# Patient Record
Sex: Female | Born: 1935
Health system: Southern US, Community
[De-identification: ages and names within clinical notes are randomized; demographics above are authoritative.]

## PROBLEM LIST (undated history)

## (undated) DIAGNOSIS — M1990 Unspecified osteoarthritis, unspecified site: Secondary | ICD-10-CM

## (undated) DIAGNOSIS — C50912 Malignant neoplasm of unspecified site of left female breast: Secondary | ICD-10-CM

## (undated) DIAGNOSIS — K642 Third degree hemorrhoids: Principal | ICD-10-CM

## (undated) DIAGNOSIS — K219 Gastro-esophageal reflux disease without esophagitis: Secondary | ICD-10-CM

## (undated) DIAGNOSIS — J449 Chronic obstructive pulmonary disease, unspecified: Secondary | ICD-10-CM

## (undated) DIAGNOSIS — I10 Essential (primary) hypertension: Secondary | ICD-10-CM

## (undated) DIAGNOSIS — Z9981 Dependence on supplemental oxygen: Secondary | ICD-10-CM

## (undated) DIAGNOSIS — F32A Depression, unspecified: Secondary | ICD-10-CM

## (undated) DIAGNOSIS — F419 Anxiety disorder, unspecified: Secondary | ICD-10-CM

## (undated) DIAGNOSIS — E7800 Pure hypercholesterolemia, unspecified: Secondary | ICD-10-CM

## (undated) DIAGNOSIS — F329 Major depressive disorder, single episode, unspecified: Secondary | ICD-10-CM

## (undated) DIAGNOSIS — C4490 Unspecified malignant neoplasm of skin, unspecified: Secondary | ICD-10-CM

## (undated) DIAGNOSIS — J441 Chronic obstructive pulmonary disease with (acute) exacerbation: Secondary | ICD-10-CM

## (undated) DIAGNOSIS — E119 Type 2 diabetes mellitus without complications: Secondary | ICD-10-CM

## (undated) DIAGNOSIS — J4489 Other specified chronic obstructive pulmonary disease: Secondary | ICD-10-CM

## (undated) HISTORY — PX: ABDOMINAL HYSTERECTOMY: SHX81

## (undated) HISTORY — PX: CATARACT EXTRACTION, BILATERAL: SHX1313

## (undated) HISTORY — PX: DILATION AND CURETTAGE OF UTERUS: SHX78

## (undated) HISTORY — PX: BREAST BIOPSY: SHX20

## (undated) HISTORY — DX: Essential (primary) hypertension: I10

## (undated) HISTORY — DX: Unspecified osteoarthritis, unspecified site: M19.90

## (undated) HISTORY — DX: Gastro-esophageal reflux disease without esophagitis: K21.9

## (undated) HISTORY — DX: Third degree hemorrhoids: K64.2

## (undated) HISTORY — PX: SKIN CANCER EXCISION: SHX779

---

## 1998-05-31 ENCOUNTER — Inpatient Hospital Stay (HOSPITAL_COMMUNITY): Admission: EM | Admit: 1998-05-31 | Discharge: 1998-06-01 | Payer: Self-pay | Admitting: Emergency Medicine

## 2001-05-16 ENCOUNTER — Encounter: Payer: Self-pay | Admitting: Family Medicine

## 2001-05-16 ENCOUNTER — Encounter: Admission: RE | Admit: 2001-05-16 | Discharge: 2001-05-16 | Payer: Self-pay | Admitting: Family Medicine

## 2003-03-19 ENCOUNTER — Encounter: Payer: Self-pay | Admitting: Family Medicine

## 2003-03-19 ENCOUNTER — Encounter: Admission: RE | Admit: 2003-03-19 | Discharge: 2003-03-19 | Payer: Self-pay | Admitting: Family Medicine

## 2006-04-18 ENCOUNTER — Encounter (INDEPENDENT_AMBULATORY_CARE_PROVIDER_SITE_OTHER): Payer: Self-pay | Admitting: *Deleted

## 2006-04-18 ENCOUNTER — Encounter (INDEPENDENT_AMBULATORY_CARE_PROVIDER_SITE_OTHER): Payer: Self-pay | Admitting: Diagnostic Radiology

## 2006-04-18 ENCOUNTER — Encounter: Admission: RE | Admit: 2006-04-18 | Discharge: 2006-04-18 | Payer: Self-pay | Admitting: Family Medicine

## 2006-05-01 ENCOUNTER — Encounter: Admission: RE | Admit: 2006-05-01 | Discharge: 2006-05-01 | Payer: Self-pay | Admitting: Surgery

## 2006-05-11 ENCOUNTER — Ambulatory Visit (HOSPITAL_COMMUNITY): Admission: RE | Admit: 2006-05-11 | Discharge: 2006-05-12 | Payer: Self-pay | Admitting: Surgery

## 2006-05-11 ENCOUNTER — Encounter (INDEPENDENT_AMBULATORY_CARE_PROVIDER_SITE_OTHER): Payer: Self-pay | Admitting: *Deleted

## 2006-05-11 HISTORY — PX: MASTECTOMY: SHX3

## 2006-05-16 ENCOUNTER — Ambulatory Visit: Payer: Self-pay | Admitting: Oncology

## 2006-05-24 LAB — CBC WITH DIFFERENTIAL/PLATELET
BASO%: 0.6 % (ref 0.0–2.0)
Basophils Absolute: 0.1 10e3/uL (ref 0.0–0.1)
EOS%: 1.7 % (ref 0.0–7.0)
Eosinophils Absolute: 0.2 10e3/uL (ref 0.0–0.5)
HCT: 39.4 % (ref 34.8–46.6)
HGB: 13.1 g/dL (ref 11.6–15.9)
LYMPH%: 33.3 % (ref 14.0–48.0)
MCH: 28.4 pg (ref 26.0–34.0)
MCHC: 33.3 g/dL (ref 32.0–36.0)
MCV: 85.2 fL (ref 81.0–101.0)
MONO#: 0.6 10e3/uL (ref 0.1–0.9)
MONO%: 5.9 % (ref 0.0–13.0)
NEUT#: 5.7 10e3/uL (ref 1.5–6.5)
NEUT%: 58.5 % (ref 39.6–76.8)
Platelets: 280 10e3/uL (ref 145–400)
RBC: 4.62 10e6/uL (ref 3.70–5.32)
RDW: 13.8 % (ref 11.3–14.5)
WBC: 9.7 10e3/uL (ref 3.9–10.0)
lymph#: 3.2 10e3/uL (ref 0.9–3.3)

## 2006-05-24 LAB — LACTATE DEHYDROGENASE: LDH: 169 U/L (ref 94–250)

## 2006-05-24 LAB — CANCER ANTIGEN 27.29: CA 27.29: 11 U/mL (ref 0–39)

## 2006-05-24 LAB — COMPREHENSIVE METABOLIC PANEL
ALT: 53 U/L — ABNORMAL HIGH (ref 0–40)
CO2: 31 mEq/L (ref 19–32)
Calcium: 9.7 mg/dL (ref 8.4–10.5)
Chloride: 99 mEq/L (ref 96–112)
Creatinine, Ser: 0.8 mg/dL (ref 0.4–1.2)
Glucose, Bld: 187 mg/dL — ABNORMAL HIGH (ref 70–99)
Sodium: 138 mEq/L (ref 135–145)
Total Protein: 7.8 g/dL (ref 6.0–8.3)

## 2006-06-07 ENCOUNTER — Ambulatory Visit (HOSPITAL_COMMUNITY): Admission: RE | Admit: 2006-06-07 | Discharge: 2006-06-07 | Payer: Self-pay | Admitting: Oncology

## 2006-07-10 ENCOUNTER — Ambulatory Visit: Payer: Self-pay | Admitting: Oncology

## 2006-07-13 LAB — COMPREHENSIVE METABOLIC PANEL
ALT: 49 U/L — ABNORMAL HIGH (ref 0–40)
Albumin: 4.5 g/dL (ref 3.5–5.2)
CO2: 31 mEq/L (ref 19–32)
Calcium: 9.8 mg/dL (ref 8.4–10.5)
Chloride: 98 mEq/L (ref 96–112)
Glucose, Bld: 111 mg/dL — ABNORMAL HIGH (ref 70–99)
Sodium: 140 mEq/L (ref 135–145)
Total Protein: 8 g/dL (ref 6.0–8.3)

## 2006-07-13 LAB — CBC WITH DIFFERENTIAL/PLATELET
BASO%: 0.5 % (ref 0.0–2.0)
Eosinophils Absolute: 0.2 10*3/uL (ref 0.0–0.5)
HCT: 44.1 % (ref 34.8–46.6)
MCHC: 33.8 g/dL (ref 32.0–36.0)
MONO#: 0.6 10*3/uL (ref 0.1–0.9)
NEUT#: 4.5 10*3/uL (ref 1.5–6.5)
Platelets: 226 10*3/uL (ref 145–400)
RBC: 5.15 10*6/uL (ref 3.70–5.32)
WBC: 9.5 10*3/uL (ref 3.9–10.0)
lymph#: 4.2 10*3/uL — ABNORMAL HIGH (ref 0.9–3.3)

## 2006-10-03 ENCOUNTER — Ambulatory Visit: Payer: Self-pay | Admitting: Oncology

## 2006-10-05 LAB — CBC WITH DIFFERENTIAL/PLATELET
Eosinophils Absolute: 0.2 10*3/uL (ref 0.0–0.5)
HGB: 15.2 g/dL (ref 11.6–15.9)
LYMPH%: 38.9 % (ref 14.0–48.0)
MCH: 29.1 pg (ref 26.0–34.0)
MCHC: 34.5 g/dL (ref 32.0–36.0)
MCV: 84.3 fL (ref 81.0–101.0)
MONO#: 0.6 10*3/uL (ref 0.1–0.9)
NEUT%: 51.7 % (ref 39.6–76.8)

## 2006-10-05 LAB — COMPREHENSIVE METABOLIC PANEL
AST: 38 U/L — ABNORMAL HIGH (ref 0–37)
Albumin: 4.4 g/dL (ref 3.5–5.2)
Alkaline Phosphatase: 51 U/L (ref 39–117)
BUN: 12 mg/dL (ref 6–23)
Calcium: 10.1 mg/dL (ref 8.4–10.5)
Creatinine, Ser: 0.82 mg/dL (ref 0.40–1.20)
Glucose, Bld: 124 mg/dL — ABNORMAL HIGH (ref 70–99)
Potassium: 5.1 mEq/L (ref 3.5–5.3)

## 2006-10-05 LAB — LACTATE DEHYDROGENASE: LDH: 144 U/L (ref 94–250)

## 2006-10-18 ENCOUNTER — Ambulatory Visit (HOSPITAL_COMMUNITY): Admission: RE | Admit: 2006-10-18 | Discharge: 2006-10-18 | Payer: Self-pay | Admitting: Oncology

## 2007-02-05 ENCOUNTER — Ambulatory Visit: Payer: Self-pay | Admitting: Oncology

## 2007-02-08 LAB — COMPREHENSIVE METABOLIC PANEL
ALT: 22 U/L (ref 0–35)
AST: 24 U/L (ref 0–37)
Albumin: 4.2 g/dL (ref 3.5–5.2)
Alkaline Phosphatase: 48 U/L (ref 39–117)
BUN: 15 mg/dL (ref 6–23)
Potassium: 5.2 mEq/L (ref 3.5–5.3)
Sodium: 139 mEq/L (ref 135–145)
Total Protein: 7.9 g/dL (ref 6.0–8.3)

## 2007-02-08 LAB — CBC WITH DIFFERENTIAL/PLATELET
BASO%: 0.5 % (ref 0.0–2.0)
EOS%: 0.7 % (ref 0.0–7.0)
MCH: 29 pg (ref 26.0–34.0)
MCV: 83.5 fL (ref 81.0–101.0)
MONO%: 6.9 % (ref 0.0–13.0)
RBC: 5.07 10*6/uL (ref 3.70–5.32)
RDW: 14.2 % (ref 11.3–14.5)
lymph#: 3.3 10*3/uL (ref 0.9–3.3)

## 2007-04-30 ENCOUNTER — Ambulatory Visit: Payer: Self-pay | Admitting: Oncology

## 2007-05-02 LAB — CBC WITH DIFFERENTIAL/PLATELET
BASO%: 0.6 % (ref 0.0–2.0)
Basophils Absolute: 0.1 10*3/uL (ref 0.0–0.1)
EOS%: 1.2 % (ref 0.0–7.0)
HCT: 42.1 % (ref 34.8–46.6)
HGB: 14.4 g/dL (ref 11.6–15.9)
LYMPH%: 39.7 % (ref 14.0–48.0)
MCH: 28.8 pg (ref 26.0–34.0)
MCHC: 34.4 g/dL (ref 32.0–36.0)
NEUT%: 52.7 % (ref 39.6–76.8)
Platelets: 298 10*3/uL (ref 145–400)

## 2007-05-02 LAB — COMPREHENSIVE METABOLIC PANEL
ALT: 29 U/L (ref 0–35)
BUN: 14 mg/dL (ref 6–23)
CO2: 27 mEq/L (ref 19–32)
Calcium: 10.4 mg/dL (ref 8.4–10.5)
Creatinine, Ser: 0.84 mg/dL (ref 0.40–1.20)
Total Bilirubin: 0.3 mg/dL (ref 0.3–1.2)

## 2007-05-02 LAB — LACTATE DEHYDROGENASE: LDH: 128 U/L (ref 94–250)

## 2007-10-30 ENCOUNTER — Ambulatory Visit: Payer: Self-pay | Admitting: Oncology

## 2007-11-01 LAB — CBC WITH DIFFERENTIAL/PLATELET
Basophils Absolute: 0 10*3/uL (ref 0.0–0.1)
EOS%: 2.5 % (ref 0.0–7.0)
Eosinophils Absolute: 0.2 10*3/uL (ref 0.0–0.5)
HCT: 41.1 % (ref 34.8–46.6)
HGB: 14.3 g/dL (ref 11.6–15.9)
MCH: 29.9 pg (ref 26.0–34.0)
MONO#: 0.6 10*3/uL (ref 0.1–0.9)
NEUT#: 4.7 10*3/uL (ref 1.5–6.5)
NEUT%: 49.9 % (ref 39.6–76.8)
lymph#: 3.8 10*3/uL — ABNORMAL HIGH (ref 0.9–3.3)

## 2007-11-01 LAB — COMPREHENSIVE METABOLIC PANEL
Albumin: 4.3 g/dL (ref 3.5–5.2)
BUN: 15 mg/dL (ref 6–23)
CO2: 28 mEq/L (ref 19–32)
Calcium: 10 mg/dL (ref 8.4–10.5)
Chloride: 105 mEq/L (ref 96–112)
Creatinine, Ser: 1.25 mg/dL — ABNORMAL HIGH (ref 0.40–1.20)
Glucose, Bld: 131 mg/dL — ABNORMAL HIGH (ref 70–99)
Potassium: 4.7 mEq/L (ref 3.5–5.3)

## 2007-11-01 LAB — LACTATE DEHYDROGENASE: LDH: 130 U/L (ref 94–250)

## 2007-11-13 ENCOUNTER — Ambulatory Visit (HOSPITAL_COMMUNITY): Admission: RE | Admit: 2007-11-13 | Discharge: 2007-11-13 | Payer: Self-pay | Admitting: Oncology

## 2007-12-17 ENCOUNTER — Ambulatory Visit: Payer: Self-pay | Admitting: Oncology

## 2007-12-25 LAB — VITAMIN D PNL(25-HYDRXY+1,25-DIHY)-BLD
Vit D, 1,25-Dihydroxy: 43 pg/mL (ref 6–62)
Vit D, 25-Hydroxy: 47 ng/mL (ref 30–89)

## 2008-06-11 ENCOUNTER — Ambulatory Visit: Payer: Self-pay | Admitting: Oncology

## 2008-06-13 LAB — CBC WITH DIFFERENTIAL/PLATELET
Basophils Absolute: 0 10*3/uL (ref 0.0–0.1)
Eosinophils Absolute: 0.1 10*3/uL (ref 0.0–0.5)
HGB: 13.6 g/dL (ref 11.6–15.9)
MCV: 84.5 fL (ref 81.0–101.0)
MONO#: 0.4 10*3/uL (ref 0.1–0.9)
MONO%: 5.8 % (ref 0.0–13.0)
NEUT#: 3.8 10*3/uL (ref 1.5–6.5)
Platelets: 260 10*3/uL (ref 145–400)
RDW: 14 % (ref 11.3–14.5)

## 2008-06-16 LAB — COMPREHENSIVE METABOLIC PANEL
Albumin: 4.7 g/dL (ref 3.5–5.2)
Alkaline Phosphatase: 24 U/L — ABNORMAL LOW (ref 39–117)
BUN: 17 mg/dL (ref 6–23)
CO2: 28 mEq/L (ref 19–32)
Glucose, Bld: 102 mg/dL — ABNORMAL HIGH (ref 70–99)
Potassium: 4.3 mEq/L (ref 3.5–5.3)
Sodium: 139 mEq/L (ref 135–145)
Total Protein: 7.5 g/dL (ref 6.0–8.3)

## 2008-06-16 LAB — CANCER ANTIGEN 27.29: CA 27.29: 19 U/mL (ref 0–39)

## 2008-06-16 LAB — LACTATE DEHYDROGENASE: LDH: 121 U/L (ref 94–250)

## 2008-06-16 LAB — VITAMIN D 25 HYDROXY (VIT D DEFICIENCY, FRACTURES): Vit D, 25-Hydroxy: 25 ng/mL — ABNORMAL LOW (ref 30–89)

## 2008-06-25 ENCOUNTER — Ambulatory Visit: Payer: Self-pay | Admitting: Oncology

## 2009-02-02 ENCOUNTER — Ambulatory Visit: Payer: Self-pay | Admitting: Oncology

## 2009-02-17 ENCOUNTER — Ambulatory Visit (HOSPITAL_COMMUNITY): Admission: RE | Admit: 2009-02-17 | Discharge: 2009-02-17 | Payer: Self-pay | Admitting: Oncology

## 2009-02-24 ENCOUNTER — Ambulatory Visit (HOSPITAL_COMMUNITY): Admission: RE | Admit: 2009-02-24 | Discharge: 2009-02-24 | Payer: Self-pay | Admitting: Oncology

## 2009-05-29 ENCOUNTER — Encounter: Admission: RE | Admit: 2009-05-29 | Discharge: 2009-05-29 | Payer: Self-pay | Admitting: Oncology

## 2009-08-05 ENCOUNTER — Ambulatory Visit: Payer: Self-pay | Admitting: Oncology

## 2009-08-07 LAB — CBC WITH DIFFERENTIAL/PLATELET
BASO%: 0.6 % (ref 0.0–2.0)
EOS%: 1.2 % (ref 0.0–7.0)
LYMPH%: 39.8 % (ref 14.0–49.7)
MCHC: 33.7 g/dL (ref 31.5–36.0)
MCV: 85.6 fL (ref 79.5–101.0)
MONO%: 7 % (ref 0.0–14.0)
Platelets: 267 10*3/uL (ref 145–400)
RBC: 4.71 10*6/uL (ref 3.70–5.45)
WBC: 7.6 10*3/uL (ref 3.9–10.3)

## 2009-08-10 LAB — COMPREHENSIVE METABOLIC PANEL
ALT: 22 U/L (ref 0–35)
Alkaline Phosphatase: 22 U/L — ABNORMAL LOW (ref 39–117)
Sodium: 136 mEq/L (ref 135–145)
Total Bilirubin: 0.3 mg/dL (ref 0.3–1.2)
Total Protein: 7.5 g/dL (ref 6.0–8.3)

## 2010-05-31 ENCOUNTER — Encounter: Admission: RE | Admit: 2010-05-31 | Discharge: 2010-05-31 | Payer: Self-pay | Admitting: Oncology

## 2010-08-04 ENCOUNTER — Ambulatory Visit: Payer: Self-pay | Admitting: Oncology

## 2010-08-06 LAB — COMPREHENSIVE METABOLIC PANEL
ALT: 19 U/L (ref 0–35)
AST: 21 U/L (ref 0–37)
Albumin: 4.6 g/dL (ref 3.5–5.2)
Alkaline Phosphatase: 18 U/L — ABNORMAL LOW (ref 39–117)
CO2: 24 mEq/L (ref 19–32)
Calcium: 10.4 mg/dL (ref 8.4–10.5)
Chloride: 94 mEq/L — ABNORMAL LOW (ref 96–112)
Total Protein: 7.1 g/dL (ref 6.0–8.3)

## 2010-08-06 LAB — LACTATE DEHYDROGENASE: LDH: 102 U/L (ref 94–250)

## 2010-08-06 LAB — CBC WITH DIFFERENTIAL/PLATELET
Basophils Absolute: 0.1 10*3/uL (ref 0.0–0.1)
EOS%: 0.9 % (ref 0.0–7.0)
HCT: 42 % (ref 34.8–46.6)
LYMPH%: 35.9 % (ref 14.0–49.7)
MCV: 84.6 fL (ref 79.5–101.0)
MONO%: 6.2 % (ref 0.0–14.0)
NEUT%: 56.4 % (ref 38.4–76.8)
Platelets: 271 10*3/uL (ref 145–400)

## 2010-08-06 LAB — VITAMIN D 25 HYDROXY (VIT D DEFICIENCY, FRACTURES): Vit D, 25-Hydroxy: 47 ng/mL (ref 30–89)

## 2011-01-15 ENCOUNTER — Other Ambulatory Visit: Payer: Self-pay | Admitting: Oncology

## 2011-01-15 DIAGNOSIS — Z1239 Encounter for other screening for malignant neoplasm of breast: Secondary | ICD-10-CM

## 2011-05-13 NOTE — Op Note (Signed)
[name], Jacqueline Cervantes               ACCOUNT NO.:  [id number]   MEDICAL RECORD NO.:  [id number]          PATIENT TYPE:  OIB   LOCATION:  5705                         FACILITY:  [facility]   PHYSICIAN:  Currie Paris, M.D.DATE OF BIRTH:  24-Oct-1935   DATE OF PROCEDURE:  05/11/2006  DATE OF DISCHARGE:  05/12/2006                                 OPERATIVE REPORT   [facility] #[id number]   PREOPERATIVE DIAGNOSIS:  Cancer left breast, multifocal.   POSTOPERATIVE DIAGNOSIS:  Cancer left breast, multifocal.   OPERATION:  Left total mastectomy with blue dye injection and axillary  sentinel lymph node biopsy (three nodes).   SURGEON:  Dr. Jamey Ripa.   ANESTHESIA:  General.   CLINICAL HISTORY:  This patient is a 70-year lady who recently was found to  have multifocal left breast cancer.  After discussion with the patient, we  elected to proceed to mastectomy with sentinel node evaluation.   DESCRIPTION OF PROCEDURE:  The patient was seen in the holding area.  She  had no further questions.  Her left breast already had been injected with  her radioisotope.  She and I marked the left breast as the operative side.   The patient was taken to have operating room and after satisfactory general  anesthesia had been obtained, I prepped the circumareolar area with some  alcohol. The time out occurred. I injected 4 mL of dilute methylene blue.   Entire breast was prepped and draped.  The elliptical incision was made.  I  marked an area the axilla that was hot with the NeoProbe.   I then raised a flap for the superior area medially to the sternum,  superiorly to the clavicle and laterally out into the axilla.  Once I got  into  the axilla, I used Neoprobe and identified two hot blue nodes.  Both  were sent for Touch Preps.  Initially I did not feel or see any other hot  areas or any other blue areas.   Inferior flap was then raised going to inframammary fold and laterally out  to latissimus.  The breast was  removed including the fascia starting  medially and working laterally and disconnected from the pectoralis,  latissimus and serratus anterior.  Once this was out, I did notice an  enlarged but soft node in the axilla and I went ahead and took this and sent  it for permanents since it felt a little bit abnormal.  There is no other  palpable adenopathy noted.   I spent several minutes irrigating making sure everything was dry.  I put  two 19 Blake drains in, one to the axilla, one under the flap.  Final check  was made for hemostasis and then I tacked both superior and inferior flaps  down to the muscle using multiple sutures of 3-0 Vicryl.  Skin was closed  staples.  I instilled about 30 mL of 0.25% plain Marcaine under the flaps  for 10 minutes and attached to suction.   The patient tolerated procedure well.  There were no intraoperative  complications.  All counts were correct.  Estimated blood loss was 150 mL.      Currie Paris, M.D.  Electronically Signed     CJS/MEDQ  D:  05/11/2006  T:  05/12/2006  Job:  [id number]   cc:   Teena Irani. Arlyce Dice, M.D.  Fax: [phone]

## 2011-06-02 ENCOUNTER — Ambulatory Visit: Payer: Self-pay

## 2011-06-02 ENCOUNTER — Other Ambulatory Visit: Payer: Self-pay | Admitting: Oncology

## 2011-06-02 DIAGNOSIS — Z1239 Encounter for other screening for malignant neoplasm of breast: Secondary | ICD-10-CM

## 2011-06-08 ENCOUNTER — Ambulatory Visit
Admission: RE | Admit: 2011-06-08 | Discharge: 2011-06-08 | Disposition: A | Discharge status: Home / Self Care | Payer: Medicare Other | Source: Ambulatory Visit | Attending: Oncology | Admitting: Oncology

## 2011-06-08 DIAGNOSIS — Z1239 Encounter for other screening for malignant neoplasm of breast: Secondary | ICD-10-CM

## 2011-08-09 ENCOUNTER — Other Ambulatory Visit: Payer: Self-pay | Admitting: Oncology

## 2011-08-09 ENCOUNTER — Encounter (HOSPITAL_BASED_OUTPATIENT_CLINIC_OR_DEPARTMENT_OTHER): Payer: Medicare Other | Admitting: Oncology

## 2011-08-09 DIAGNOSIS — F17200 Nicotine dependence, unspecified, uncomplicated: Secondary | ICD-10-CM

## 2011-08-09 DIAGNOSIS — C50919 Malignant neoplasm of unspecified site of unspecified female breast: Secondary | ICD-10-CM

## 2011-08-09 LAB — CBC WITH DIFFERENTIAL/PLATELET
BASO%: 0.3 % (ref 0.0–2.0)
EOS%: 0.6 % (ref 0.0–7.0)
Eosinophils Absolute: 0.1 10*3/uL (ref 0.0–0.5)
HCT: 42.9 % (ref 34.8–46.6)
HGB: 14.6 g/dL (ref 11.6–15.9)
LYMPH%: 34.7 % (ref 14.0–49.7)
NEUT#: 5.1 10*3/uL (ref 1.5–6.5)
Platelets: 291 10*3/uL (ref 145–400)
RBC: 5.03 10*6/uL (ref 3.70–5.45)
WBC: 8.9 10*3/uL (ref 3.9–10.3)

## 2011-08-10 LAB — COMPREHENSIVE METABOLIC PANEL
Albumin: 4.5 g/dL (ref 3.5–5.2)
CO2: 27 mEq/L (ref 19–32)
Calcium: 10.5 mg/dL (ref 8.4–10.5)
Chloride: 91 mEq/L — ABNORMAL LOW (ref 96–112)
Glucose, Bld: 146 mg/dL — ABNORMAL HIGH (ref 70–99)
Potassium: 4.5 mEq/L (ref 3.5–5.3)
Sodium: 128 mEq/L — ABNORMAL LOW (ref 135–145)
Total Bilirubin: 0.3 mg/dL (ref 0.3–1.2)
Total Protein: 7.6 g/dL (ref 6.0–8.3)

## 2011-08-10 LAB — CANCER ANTIGEN 27.29: CA 27.29: 10 U/mL (ref 0–39)

## 2011-08-10 LAB — VITAMIN D 25 HYDROXY (VIT D DEFICIENCY, FRACTURES): Vit D, 25-Hydroxy: 49 ng/mL (ref 30–89)

## 2011-08-16 ENCOUNTER — Other Ambulatory Visit: Payer: Self-pay | Admitting: Oncology

## 2011-08-16 ENCOUNTER — Ambulatory Visit (HOSPITAL_COMMUNITY)
Admission: RE | Admit: 2011-08-16 | Discharge: 2011-08-16 | Disposition: A | Discharge status: Home / Self Care | Payer: Medicare Other | Source: Ambulatory Visit | Attending: Oncology | Admitting: Oncology

## 2011-08-16 ENCOUNTER — Encounter (HOSPITAL_BASED_OUTPATIENT_CLINIC_OR_DEPARTMENT_OTHER): Payer: Medicare Other | Admitting: Oncology

## 2011-08-16 DIAGNOSIS — C50919 Malignant neoplasm of unspecified site of unspecified female breast: Secondary | ICD-10-CM

## 2011-08-16 DIAGNOSIS — R059 Cough, unspecified: Secondary | ICD-10-CM | POA: Insufficient documentation

## 2011-08-16 DIAGNOSIS — F17200 Nicotine dependence, unspecified, uncomplicated: Secondary | ICD-10-CM | POA: Insufficient documentation

## 2011-08-16 DIAGNOSIS — R05 Cough: Secondary | ICD-10-CM | POA: Insufficient documentation

## 2011-08-22 ENCOUNTER — Other Ambulatory Visit: Payer: Self-pay | Admitting: Oncology

## 2011-08-22 DIAGNOSIS — C3490 Malignant neoplasm of unspecified part of unspecified bronchus or lung: Secondary | ICD-10-CM

## 2011-09-06 ENCOUNTER — Other Ambulatory Visit (HOSPITAL_COMMUNITY): Payer: Medicare Other

## 2011-12-31 ENCOUNTER — Emergency Department (HOSPITAL_COMMUNITY)
Admission: EM | Admit: 2011-12-31 | Discharge: 2011-12-31 | Disposition: A | Discharge status: Home / Self Care | Payer: Medicare Other | Attending: Emergency Medicine | Admitting: Emergency Medicine

## 2011-12-31 ENCOUNTER — Emergency Department (HOSPITAL_COMMUNITY): Payer: Medicare Other

## 2011-12-31 DIAGNOSIS — Z9079 Acquired absence of other genital organ(s): Secondary | ICD-10-CM | POA: Insufficient documentation

## 2011-12-31 DIAGNOSIS — R748 Abnormal levels of other serum enzymes: Secondary | ICD-10-CM | POA: Insufficient documentation

## 2011-12-31 DIAGNOSIS — R062 Wheezing: Secondary | ICD-10-CM | POA: Insufficient documentation

## 2011-12-31 DIAGNOSIS — R634 Abnormal weight loss: Secondary | ICD-10-CM | POA: Insufficient documentation

## 2011-12-31 DIAGNOSIS — R110 Nausea: Secondary | ICD-10-CM | POA: Insufficient documentation

## 2011-12-31 DIAGNOSIS — F17200 Nicotine dependence, unspecified, uncomplicated: Secondary | ICD-10-CM | POA: Insufficient documentation

## 2011-12-31 DIAGNOSIS — Z859 Personal history of malignant neoplasm, unspecified: Secondary | ICD-10-CM | POA: Insufficient documentation

## 2011-12-31 DIAGNOSIS — R109 Unspecified abdominal pain: Secondary | ICD-10-CM | POA: Insufficient documentation

## 2011-12-31 DIAGNOSIS — E119 Type 2 diabetes mellitus without complications: Secondary | ICD-10-CM | POA: Insufficient documentation

## 2011-12-31 HISTORY — DX: Pure hypercholesterolemia, unspecified: E78.00

## 2011-12-31 LAB — URINALYSIS, ROUTINE W REFLEX MICROSCOPIC
Bilirubin Urine: NEGATIVE
Ketones, ur: NEGATIVE mg/dL
Leukocytes, UA: NEGATIVE
Nitrite: NEGATIVE
Protein, ur: NEGATIVE mg/dL
pH: 5.5 (ref 5.0–8.0)

## 2011-12-31 LAB — DIFFERENTIAL
Basophils Absolute: 0 10*3/uL (ref 0.0–0.1)
Basophils Relative: 0 % (ref 0–1)
Eosinophils Absolute: 0.1 10*3/uL (ref 0.0–0.7)
Monocytes Absolute: 0.8 10*3/uL (ref 0.1–1.0)
Monocytes Relative: 8 % (ref 3–12)
Neutrophils Relative %: 57 % (ref 43–77)

## 2011-12-31 LAB — COMPREHENSIVE METABOLIC PANEL
AST: 18 U/L (ref 0–37)
Albumin: 4.1 g/dL (ref 3.5–5.2)
BUN: 14 mg/dL (ref 6–23)
Creatinine, Ser: 0.71 mg/dL (ref 0.50–1.10)
Potassium: 3.9 mEq/L (ref 3.5–5.1)
Total Protein: 7.9 g/dL (ref 6.0–8.3)

## 2011-12-31 LAB — LIPASE, BLOOD: Lipase: 89 U/L — ABNORMAL HIGH (ref 11–59)

## 2011-12-31 LAB — CBC
Hemoglobin: 14 g/dL (ref 12.0–15.0)
MCH: 27.8 pg (ref 26.0–34.0)
MCHC: 33.5 g/dL (ref 30.0–36.0)
RDW: 14.1 % (ref 11.5–15.5)

## 2011-12-31 MED ORDER — IOHEXOL 300 MG/ML  SOLN
100.0000 mL | Freq: Once | INTRAMUSCULAR | Status: AC | PRN
  Administered 2011-12-31: 100 mL via INTRAVENOUS

## 2011-12-31 MED ORDER — HYDROCODONE-ACETAMINOPHEN 5-325 MG PO TABS: 1.0000 | ORAL_TABLET | Freq: Four times a day (QID) | ORAL | Status: AC | PRN

## 2011-12-31 MED ORDER — MORPHINE SULFATE 4 MG/ML IJ SOLN
4.0000 mg | Freq: Once | INTRAMUSCULAR | Status: AC
  Administered 2011-12-31: 4 mg via INTRAVENOUS
  Filled 2011-12-31: qty 1

## 2011-12-31 MED ORDER — ONDANSETRON HCL 4 MG PO TABS: 4.0000 mg | ORAL_TABLET | Freq: Four times a day (QID) | ORAL | Status: AC

## 2011-12-31 NOTE — ED Notes (Signed)
Pt presents with abdominal pain, constipation, nausea, and weakness x 1 month.

## 2011-12-31 NOTE — ED Provider Notes (Signed)
History     CSN: [id number]  Arrival date & time 12/31/11  1243   First MD Initiated Contact with Patient 12/31/11 1450      Chief Complaint  Patient presents with  . Abdominal Pain  . Constipation  . Nausea  . Weakness    (Consider location/radiation/quality/duration/timing/severity/associated sxs/prior treatment) HPI Patient presents to the emergency room with complaints of abdominal pain for the last 4 weeks. She also states she's been having trouble with nausea poor appetite and weight loss. Patient was seen previously at [facility] emergency room and states she had blood tests and x-rays and was given an enema. Patient states the symptoms have persisted. She has not seen her primary doctor for the symptoms. Patient states over the last week it seems beginning worse. The abdominal pain seems to come and go it is mostly in her lower abdomen. It is not there presently. She does get nauseated whenever she tries to eat. Patient denies any fevers or coughing. She states she had a normal bowel movement on Thursday. She gave herself a laxative today and had a bowel movement. She has not had trouble with urination. She has not actually vomited. Past Medical History  Diagnosis Date  . Hypercholesteremia   . Diabetes mellitus   . Cancer     Past Surgical History  Procedure Date  . Abdominal hysterectomy   . Breast surgery     No family history on file.  History  Substance Use Topics  . Smoking status: Current Everyday Smoker -- 0.5 packs/day  . Smokeless tobacco: Not on file  . Alcohol Use: No    OB History    Grav Para Term Preterm Abortions TAB SAB Ect Mult Living                  Review of Systems  All other systems reviewed and are negative.    Allergies  Review of patient's allergies indicates no known allergies.  Home Medications  No current outpatient prescriptions on file.  BP 134/62  Pulse 102  Temp(Src) 98.2 F (36.8 C) (Oral)  Resp 20  Ht 5\' 4"   (1.626 m)  Wt 128 lb (58.06 kg)  BMI 21.97 kg/m2  SpO2 94%  Physical Exam  Nursing note and vitals reviewed. Constitutional: She appears well-developed and well-nourished. No distress.  HENT:  Head: Normocephalic and atraumatic.  Right Ear: External ear normal.  Left Ear: External ear normal.  Eyes: Conjunctivae are normal. Right eye exhibits no discharge. Left eye exhibits no discharge. No scleral icterus.  Neck: Neck supple. No tracheal deviation present.  Cardiovascular: Normal rate, regular rhythm and intact distal pulses.   Pulmonary/Chest: Effort normal. No stridor. No respiratory distress. She has wheezes (few wheezes on exam). She has no rales.  Abdominal: Soft. Bowel sounds are normal. She exhibits no distension and no mass. There is no tenderness. There is no rebound and no guarding.       Protuberant abdomen  Musculoskeletal: She exhibits no edema and no tenderness.  Neurological: She is alert. She has normal strength. No sensory deficit. Cranial nerve deficit:  no gross defecits noted. She exhibits normal muscle tone. She displays no seizure activity. Coordination normal.  Skin: Skin is warm and dry. No rash noted.  Psychiatric: She has a normal mood and affect.    ED Course  Procedures (including critical care time)  Labs Reviewed  COMPREHENSIVE METABOLIC PANEL - Abnormal; Notable for the following:    Sodium 132 (*)  Chloride 91 (*)    Glucose, Bld 102 (*)    Calcium 10.8 (*)    Alkaline Phosphatase 24 (*)    Total Bilirubin 0.2 (*)    GFR calc non Af Amer 83 (*)    All other components within normal limits  LIPASE, BLOOD - Abnormal; Notable for the following:    Lipase 89 (*)    All other components within normal limits  URINALYSIS, ROUTINE W REFLEX MICROSCOPIC  CBC  DIFFERENTIAL   Ct Abdomen Pelvis W Contrast  12/31/2011  *RADIOLOGY REPORT*  Clinical Data: Abdominal pain and weight loss.  CT ABDOMEN AND PELVIS WITH CONTRAST  Technique:  Multidetector CT  imaging of the abdomen and pelvis was performed following the standard protocol during bolus administration of intravenous contrast.  Contrast: OMNIPAQUE IOHEXOL 300 MG/ML IV SOLN  Comparison: CT scan 06/14/2011.  Findings: The lung bases are clear.  No pleural effusion or pulmonary nodule.  There is diffuse fatty infiltration of the liver but no focal hepatic lesions or intrahepatic ductal dilatation.  The gallbladder is normal.  No common bile duct dilatation.  The pancreas is normal.  The spleen is normal in size.  There is a stable small cystic lesion anteriorly.  The adrenal glands and kidneys are unremarkable and unchanged.  The stomach, duodenum, small bowel and colon demonstrate no significant findings.  There is diverticulosis of the sigmoid colon and associated muscular wall thickening.  No findings for acute diverticulitis and no mass lesion.  There are scattered periportal, celiac axis and peripancreatic lymph nodes.  No mesenteric or retroperitoneal mass or adenopathy. The aorta demonstrates advanced atherosclerotic calcifications. There is also involvement of the major branch vessels.  There is a stable saccular aneurysm or deep ulcerated plaque involving the right side of the aorta at the level of the IMA takeoff.  No dissection.  The use is surgically absent.  Both ovaries are still present and appear normal.  The bladder is unremarkable.  No pelvic mass, adenopathy or free pelvic fluid collections.  No inguinal mass or hernia.  The bony structures are intact.  IMPRESSION:  1.  Diffuse fatty infiltration of the liver. 2.  Stable small splenic lesion. 3.  Stable scattered borderline upper abdominal lymph nodes. 4.  Diverticulosis of the sigmoid colon with associated muscular wall thickening. 5.  No acute abdominal/pelvic findings, mass lesions or lymphadenopathy. 6.  Advanced atherosclerotic changes involving the aorta and branch vessels.  Stable right-sided saccular aneurysm or deep ulcerated  plaque.  Original Report Authenticated By: P. Loralie Champagne, M.D.   Dg Abd Acute W/chest  12/31/2011  *RADIOLOGY REPORT*  Clinical Data: Lower abdominal pain and pressure.  History of breast cancer.  ACUTE ABDOMEN SERIES (ABDOMEN 2 VIEW & CHEST 1 VIEW)  Comparison: 12/08/2011 at [facility].  Findings: The heart remains normal in size and the lungs remain mildly hyperexpanded.  The lungs are clear.  Normal bowel gas pattern without free peritoneal air.  Stable marked L1 vertebral compression deformity and lumbar and lower thoracic spine degenerative changes.  IMPRESSION: Stable mild changes of COPD.  No acute abnormality.  Original Report Authenticated By: Darrol Angel, M.D.     1. Abdominal pain       MDM  The patient's CT scan does not show any evidence of an acute abnormality.  The patient does have a slight increase in her lipase however. Symptoms are not typical for pancreatitis. She does not drink any alcohol. There is no evidence  of obstruction, mass or acute infection on CT scan. At this time there does not appear to be any evidence of an acute emergency medical condition. The patient can safely followup with her Dr. this week. I did explain to her that she should have her lipase rechecked. I prescribed her hydrocodone and Zofran to take for her symptoms.        Celene Kras, MD 12/31/11 [phone]

## 2012-01-25 ENCOUNTER — Telehealth (INDEPENDENT_AMBULATORY_CARE_PROVIDER_SITE_OTHER): Payer: Self-pay

## 2012-01-25 NOTE — Telephone Encounter (Signed)
Patient daughter called stating [name] is having an increase of abdominal pain, she's having difficulty keeping liquids down without nausea, patient seem's to be in a lot of discomfort.  Suggested Emergency Room, this was declined, patient stated she has been to the ED on 2 separate occasions and prefers to be seen by Dr. Jamey Ripa as soon as possible.  Reviewed with Dr. Luisa Hart in Urgent Office.   ([name] has appointment on 01/27/12 w/Dr. Jamey Ripa)

## 2012-01-27 ENCOUNTER — Encounter (INDEPENDENT_AMBULATORY_CARE_PROVIDER_SITE_OTHER): Payer: Self-pay | Admitting: Surgery

## 2012-01-27 ENCOUNTER — Ambulatory Visit (INDEPENDENT_AMBULATORY_CARE_PROVIDER_SITE_OTHER): Payer: Medicare Other | Admitting: Surgery

## 2012-01-27 DIAGNOSIS — R1031 Right lower quadrant pain: Secondary | ICD-10-CM | POA: Insufficient documentation

## 2012-01-27 DIAGNOSIS — R1032 Left lower quadrant pain: Secondary | ICD-10-CM | POA: Insufficient documentation

## 2012-01-27 DIAGNOSIS — R109 Unspecified abdominal pain: Secondary | ICD-10-CM

## 2012-01-27 NOTE — Progress Notes (Signed)
  CC: Abdominal pain HPI: This patient was referred by her primary care physician for evaluation of some abdominal pain. He was concerned that she might have some scar tissue from prior surgery because her pain was in the lower midline where she has an old scar. She has been troubled with this for several months. I spoke with the physician by phone and reviewed some of her prior workup which included a CT scan this past summer which showed some diverticulosis and a CT scan a year ago which showed the same thing. She is adequately sutured to the emergency department for this.  The patient notes that her pain is primarily lower midline. It seems to radiate around to her rectum. She has had more problems with constipation and diminished caliber of her stools. This is somewhat better on MiraLax which she started on recently. She is also taking hydrocodone because of the pain. She has very persistent nausea. She then lost about 20 pounds all of this over the last several months.   ROS: The review of systems was noted and filled out by the patient and was positive for weight loss fever abdominal pain constipation nausea rectal pain arthritis and weakness.  MEDS:   ALLERGIES:    PE General: The patient alert and in no distress HEENT: Basically normal Lungs: Normal respirations occurred auscultation Heart: Regular rhythm no murmurs rubs or gallops Abdomen: Soft. There is a well-healed lower midline scar. There is no masses or hepatosplenomegaly noted. She has very mild tenderness in the right upper quadrant. She is also little tender in both lower abdomen but there is no rebound or guarding. There no masses palpable. Bowel sounds are normal.  Rectal: Deferred was recently done by Dr.Wong he reports that it was negative for mass and stool was guaiac-negative  Data Reviewed I have reviewed the electronic medical record and the prior CT scan as well as discussed the situation with Dr.Wong. There are  no specific abnormalities noted other than the diverticulosis Assessment Abdominal pain of uncertain etiology. Her symptoms seem to me to be more related to colon dysfunction and I'm concerned she could have either a stricture from diverticulosis or perhaps a mass. Her weight loss is also concerning.  Plan I discussed the situation with Dr.Wong. We are going to arrange for a GI consultation. Depending on that we can make further plans. I do not currently see a surgical indication and did not bleed he has been having intermittent partial small bowel obstruction.

## 2012-01-27 NOTE — Patient Instructions (Signed)
  I think you need to have more evaluation of your colon. We will make arrangements for you to see a gastroenterologist.

## 2012-02-01 ENCOUNTER — Encounter (INDEPENDENT_AMBULATORY_CARE_PROVIDER_SITE_OTHER): Payer: Medicare Other | Admitting: Surgery

## 2012-02-03 ENCOUNTER — Other Ambulatory Visit: Payer: Self-pay | Admitting: Gastroenterology

## 2012-03-01 ENCOUNTER — Telehealth: Payer: Self-pay | Admitting: *Deleted

## 2012-03-01 NOTE — Telephone Encounter (Signed)
patient confirmed over the phone for the new date and time on 03-05-2012 at 3:00pm

## 2012-03-02 ENCOUNTER — Other Ambulatory Visit: Payer: Medicare Other | Admitting: Lab

## 2012-03-05 ENCOUNTER — Other Ambulatory Visit (HOSPITAL_BASED_OUTPATIENT_CLINIC_OR_DEPARTMENT_OTHER): Payer: Medicare Other | Admitting: Lab

## 2012-03-05 ENCOUNTER — Ambulatory Visit (HOSPITAL_BASED_OUTPATIENT_CLINIC_OR_DEPARTMENT_OTHER): Payer: Medicare Other | Admitting: Oncology

## 2012-03-05 ENCOUNTER — Telehealth: Payer: Self-pay | Admitting: *Deleted

## 2012-03-05 DIAGNOSIS — E559 Vitamin D deficiency, unspecified: Secondary | ICD-10-CM

## 2012-03-05 DIAGNOSIS — F17200 Nicotine dependence, unspecified, uncomplicated: Secondary | ICD-10-CM

## 2012-03-05 DIAGNOSIS — C50919 Malignant neoplasm of unspecified site of unspecified female breast: Secondary | ICD-10-CM

## 2012-03-05 LAB — CBC WITH DIFFERENTIAL/PLATELET
BASO%: 0.6 % (ref 0.0–2.0)
Basophils Absolute: 0.1 10*3/uL (ref 0.0–0.1)
Eosinophils Absolute: 0.5 10*3/uL (ref 0.0–0.5)
HCT: 42 % (ref 34.8–46.6)
HGB: 13.7 g/dL (ref 11.6–15.9)
MONO#: 0.6 10*3/uL (ref 0.1–0.9)
NEUT#: 4.6 10*3/uL (ref 1.5–6.5)
NEUT%: 55.2 % (ref 38.4–76.8)
WBC: 8.3 10*3/uL (ref 3.9–10.3)
lymph#: 2.6 10*3/uL (ref 0.9–3.3)

## 2012-03-05 NOTE — Telephone Encounter (Signed)
per orders from 03-05-2012 made patient appointment for 02-2013

## 2012-03-06 LAB — COMPREHENSIVE METABOLIC PANEL
ALT: 14 U/L (ref 0–35)
BUN: 11 mg/dL (ref 6–23)
CO2: 30 mEq/L (ref 19–32)
Calcium: 9.9 mg/dL (ref 8.4–10.5)
Chloride: 99 mEq/L (ref 96–112)
Creatinine, Ser: 0.6 mg/dL (ref 0.50–1.10)
Glucose, Bld: 93 mg/dL (ref 70–99)

## 2012-03-06 LAB — LACTATE DEHYDROGENASE: LDH: 114 U/L (ref 94–250)

## 2012-03-06 NOTE — Progress Notes (Signed)
Hematology and Oncology Follow Up Visit  Jacqueline Cervantes [id number] 11-28-1935 77 y.o. 03/06/2012 4:42 PM PCP Dr Algis Downs Christell Constant  Principle Diagnosis: Multifocal lobular cancer on the right status post mastectomy 2007, on Arimidex History tobacco abuse History of hypertension  Interim History:  There have been no intercurrent illness, hospitalizations or medication changes. She is having a lot of interventions with GI thought to have what sounds like H. pylori has been treated for that. She has had both endoscopies and colonoscopies for her decreased appetite and weight loss. She may also have diverticulosis. She did not have a CT scan of the chest as per previous discussion. She continues to smoke but less than she has no past. She has chronic dyspnea. No other intercurrent problems.  Medications: I have reviewed the patient's current medications.  Allergies: No Known Allergies  Past Medical History, Surgical history, Social history, and Family History were reviewed and updated.  Review of Systems: Constitutional:  Negative for fever, chills, night sweats, anorexia, weight loss, pain. Cardiovascular: no chest pain or dyspnea on exertion Respiratory: positive for - orthopnea and shortness of breath Neurological: negative Dermatological: negative ENT: negative Skin Gastrointestinal: negative Genito-Urinary: negative Hematological and Lymphatic: negative Breast: negative Musculoskeletal: negative Remaining ROS negative.  Physical Exam: Blood pressure 180/93, pulse 91, temperature 98.4 F (36.9 C), height 5\' 5"  (1.651 m), weight 124 lb 12.8 oz (56.609 kg). ECOG: 0 General appearance: alert, cooperative and appears stated age Head: Normocephalic, without obvious abnormality, atraumatic Neck: no adenopathy, no carotid bruit, no JVD, supple, symmetrical, trachea midline and thyroid not enlarged, symmetric, no tenderness/mass/nodules Lymph nodes: Cervical, supraclavicular, and axillary nodes  normal. Cardiac : regular rate and rhythm, no murmurs or gallops Pulmonary:clear to auscultation bilaterally and normal percussion bilaterally Breasts: Status post right mastectomy negative chest wall exam, left breast normal Abdomen:soft, non-tender; bowel sounds normal; no masses,  no organomegaly Extremities negative Neuro: alert, oriented, normal speech, no focal findings or movement disorder noted  Lab Results: Lab Results  Component Value Date   WBC 8.3 03/05/2012   HGB 13.7 03/05/2012   HCT 42.0 03/05/2012   MCV 84.2 03/05/2012   PLT 272 03/05/2012     Chemistry      Component Value Date/Time   NA 139 03/05/2012 1411   K 4.3 03/05/2012 1411   CL 99 03/05/2012 1411   CO2 30 03/05/2012 1411   BUN 11 03/05/2012 1411   CREATININE 0.60 03/05/2012 1411      Component Value Date/Time   CALCIUM 9.9 03/05/2012 1411   ALKPHOS 18* 03/05/2012 1411   AST 19 03/05/2012 1411   ALT 14 03/05/2012 1411   BILITOT 0.3 03/05/2012 1411      .pathology. Radiological Studies: chest X-ray wnl Mammogram 6/12-wnl Bone density Due 6/13  Impression and Plan: Pleasant woman with history of breast cancer apparent remission with no evidence of local or distant recurrence. I have your labs with her today. I once again emphasized the need for her to stop smoking. She is to cut back. I will see her in years time with appropriate imaging studies  More than 50% of the visit was spent in patient-related counselling   Pierce Crane, MD 3/12/20134:42 PM

## 2012-03-09 ENCOUNTER — Ambulatory Visit: Payer: Medicare Other | Admitting: Oncology

## 2012-03-20 ENCOUNTER — Encounter (INDEPENDENT_AMBULATORY_CARE_PROVIDER_SITE_OTHER): Payer: Self-pay

## 2012-08-24 ENCOUNTER — Other Ambulatory Visit: Payer: Self-pay | Admitting: Surgery

## 2012-11-27 ENCOUNTER — Other Ambulatory Visit: Payer: Self-pay | Admitting: Family Medicine

## 2012-11-27 DIAGNOSIS — Z9012 Acquired absence of left breast and nipple: Secondary | ICD-10-CM

## 2012-11-27 DIAGNOSIS — R922 Inconclusive mammogram: Secondary | ICD-10-CM

## 2012-12-06 ENCOUNTER — Ambulatory Visit
Admission: RE | Admit: 2012-12-06 | Discharge: 2012-12-06 | Disposition: A | Discharge status: Home / Self Care | Payer: Medicare Other | Source: Ambulatory Visit | Attending: Family Medicine | Admitting: Family Medicine

## 2012-12-06 DIAGNOSIS — Z9012 Acquired absence of left breast and nipple: Secondary | ICD-10-CM

## 2012-12-06 DIAGNOSIS — R922 Inconclusive mammogram: Secondary | ICD-10-CM

## 2012-12-26 HISTORY — PX: FLEXIBLE SIGMOIDOSCOPY: SHX1649

## 2013-01-26 ENCOUNTER — Encounter: Payer: Self-pay | Admitting: Oncology

## 2013-02-28 ENCOUNTER — Ambulatory Visit (HOSPITAL_BASED_OUTPATIENT_CLINIC_OR_DEPARTMENT_OTHER): Payer: Medicare Other | Admitting: Adult Health

## 2013-02-28 ENCOUNTER — Telehealth: Payer: Self-pay | Admitting: Oncology

## 2013-02-28 ENCOUNTER — Ambulatory Visit (HOSPITAL_BASED_OUTPATIENT_CLINIC_OR_DEPARTMENT_OTHER): Payer: Medicare Other | Admitting: Lab

## 2013-02-28 ENCOUNTER — Encounter: Payer: Self-pay | Admitting: Adult Health

## 2013-02-28 VITALS — BP 141/62 | HR 92 | Temp 98.6°F | Resp 20 | Ht 65.0 in | Wt 133.6 lb

## 2013-02-28 DIAGNOSIS — Z853 Personal history of malignant neoplasm of breast: Secondary | ICD-10-CM

## 2013-02-28 DIAGNOSIS — Z9010 Acquired absence of unspecified breast and nipple: Secondary | ICD-10-CM

## 2013-02-28 DIAGNOSIS — C50912 Malignant neoplasm of unspecified site of left female breast: Secondary | ICD-10-CM

## 2013-02-28 DIAGNOSIS — F17200 Nicotine dependence, unspecified, uncomplicated: Secondary | ICD-10-CM

## 2013-02-28 LAB — COMPREHENSIVE METABOLIC PANEL (CC13)
ALT: 22 U/L (ref 0–55)
Albumin: 3.9 g/dL (ref 3.5–5.0)
CO2: 31 mEq/L — ABNORMAL HIGH (ref 22–29)
Calcium: 10.7 mg/dL — ABNORMAL HIGH (ref 8.4–10.4)
Chloride: 92 mEq/L — ABNORMAL LOW (ref 98–107)
Creatinine: 0.9 mg/dL (ref 0.6–1.1)
Potassium: 4.5 mEq/L (ref 3.5–5.1)

## 2013-02-28 LAB — CBC WITH DIFFERENTIAL/PLATELET
BASO%: 0.9 % (ref 0.0–2.0)
Basophils Absolute: 0.1 10*3/uL (ref 0.0–0.1)
HCT: 41.9 % (ref 34.8–46.6)
HGB: 13.8 g/dL (ref 11.6–15.9)
MCHC: 32.9 g/dL (ref 31.5–36.0)
MONO#: 0.6 10*3/uL (ref 0.1–0.9)
NEUT%: 54 % (ref 38.4–76.8)
WBC: 7.7 10*3/uL (ref 3.9–10.3)
lymph#: 2.8 10*3/uL (ref 0.9–3.3)

## 2013-02-28 NOTE — Telephone Encounter (Signed)
, °

## 2013-02-28 NOTE — Patient Instructions (Addendum)
Doing well.  Repeat mammo in 11/2013.  We will see you in 1 year.  Please call us if you have any questions or concerns.

## 2013-02-28 NOTE — Progress Notes (Signed)
OFFICE PROGRESS NOTE  CC**  Redmond Baseman, MD [address] [place] [address]  DIAGNOSIS: 78 y/o female with h/o stage IA ER positive, PR positive, HER-2/neu negative invasive lobular carcinoma of the left breast diagnosed in 2007.  PRIOR THERAPY:  1. Underwent screening mammogram in April 2007 where an abnormality was noted.  Biopsy showed invasive mammary carcinoma of the left breast.  MRI detected two masses approximately 1.4 cm in size.    2. Patient underwent left mastectomy by Dr. Jamey Ripa on 05/11/06.  Pathology showed invasive lobular carcinoma 1.2 cm and 1.5 cm.  1/3 lymph nodes positive for isolated tumor cells.  The first tumor was ER 77%, PR 68%, HER-2/neu indeterminate, Ki-67 7%.  The second tumor was ER 94%, PR 88%, HER-2/neu neg, Ki-67 15%.    3. She was begun on Arimidex in May 2007 and tolerated it well until she completed 5 years of therapy in May 2013.  She has underwent annual diagnostic mammograms since and no abnormality has been noted.   CURRENT THERAPY: Observation  INTERVAL HISTORY: Jacqueline Cervantes 78 y.o. female returns for annual follow up for her history of Stage IA breast cancer.  We reviewed her health maintenance information below.  She continues to smoke approximately 1/4 pack of cigarettes per day, and is cutting back.  She is not interested in quitting at this time.  She does have osteopenia and takes Fosamax for this as prescribed by her PCP.  She hasn't noted any changes in her breasts, or swelling in either arms.  A 10 point ROS is negative.    MEDICAL HISTORY: Past Medical History  Diagnosis Date  . Hypercholesteremia   . Diabetes mellitus   . History of breast cancer   . Arthritis   . Hypertension     ALLERGIES:  has No Known Allergies.  MEDICATIONS:  Current Outpatient Prescriptions  Medication Sig Dispense Refill  . alendronate (FOSAMAX) 70 MG tablet Take 70 mg by mouth every 7 (seven) days. Take  with a full glass of water on an empty stomach./// Patient takes on Friday at night       . ALPRAZolam (XANAX) 1 MG tablet Take 1 mg by mouth at bedtime as needed.      Marland Kitchen amLODipine (NORVASC) 10 MG tablet       . atorvastatin (LIPITOR) 40 MG tablet       . Calcium Carbonate-Vitamin D (CALCIUM 600+D) 600-400 MG-UNIT per tablet Take 1 tablet by mouth daily.        . Cholecalciferol (VITAMIN D3) 2000 UNITS TABS Take 1 capsule by mouth daily.        . citalopram (CELEXA) 20 MG tablet       . docusate sodium (COLACE) 50 MG capsule Take by mouth 2 (two) times daily.        . fenofibrate 160 MG tablet Take 160 mg by mouth at bedtime.        . hydrochlorothiazide (MICROZIDE) 12.5 MG capsule       . HYDROcodone-acetaminophen (VICODIN) 5-500 MG per tablet Take 1 tablet by mouth every 6 (six) hours as needed.      Marland Kitchen lisinopril (PRINIVIL,ZESTRIL) 40 MG tablet       . metFORMIN (GLUCOPHAGE) 500 MG tablet Take 500 mg by mouth 2 (two) times daily. Patient takes 2 tablets in the morning and 1 tablet at betime       . Omega-3 Fatty Acids (FISH OIL) 1200 MG  CAPS Take 1 capsule by mouth daily.        . Polyethylene Glycol 3350 (MIRALAX PO) Take by mouth 2 (two) times daily.       No current facility-administered medications for this visit.    SURGICAL HISTORY:  Past Surgical History  Procedure Laterality Date  . Abdominal hysterectomy    . Breast surgery  05/11/2006    mastectomy    REVIEW OF SYSTEMS:   General: fatigue (-), night sweats (-), fever (-), pain (-) Lymph: palpable nodes (-) HEENT: vision changes (-), mucositis (-), gum bleeding (-), epistaxis (-) Cardiovascular: chest pain (-), palpitations (-) Pulmonary: shortness of breath (-), dyspnea on exertion (-), cough (-), hemoptysis (-) GI:  Early satiety (-), melena (-), dysphagia (-), nausea/vomiting (-), diarrhea (-) GU: dysuria (-), hematuria (-), incontinence (-) Musculoskeletal: joint swelling (-), joint pain (-), back pain (-) Neuro:  weakness (-), numbness (-), headache (-), confusion (-) Skin: Rash (-), lesions (-), dryness (-) Psych: depression (-), suicidal/homicidal ideation (-), feeling of hopelessness (-)  HEALTH MAINTENANCE: Mammogram 12/06/12-normal Colonoscopy 2012 Bone Density: 05/31/10 (followed by PCP Dr. Modesto Charon) Pap Smear 05/2012 Eye Exam 02/2013 Vitamin D checked 3/13, 50 Lipid Panel 2/14  PHYSICAL EXAMINATION: Blood pressure 141/62, pulse 92, temperature 98.6 F (37 C), temperature source Oral, resp. rate 20, height 5\' 5"  (1.651 m), weight 133 lb 9.6 oz (60.601 kg). Body mass index is 22.23 kg/(m^2). General: Patient is a well appearing female in no acute distress HEENT: PERRLA, sclerae anicteric no conjunctival pallor, MMM Neck: supple, no palpable adenopathy Lungs: clear to auscultation bilaterally, no wheezes, rhonchi, or rales Cardiovascular: regular rate rhythm, S1, S2, no murmurs, rubs or gallops Abdomen: Soft, non-tender, non-distended, normoactive bowel sounds, no HSM Extremities: warm and well perfused, no clubbing, cyanosis, or edema Skin: No rashes or lesions Neuro: Non-focal Breasts: left breast mastectomy site well healed, no nodularity, right breast no nodules, masses, or skin changes noted ECOG PERFORMANCE STATUS: 0 - Asymptomatic      LABORATORY DATA: Lab Results  Component Value Date   WBC 7.7 02/28/2013   HGB 13.8 02/28/2013   HCT 41.9 02/28/2013   MCV 85.0 02/28/2013   PLT 297 02/28/2013      Chemistry      Component Value Date/Time   NA 135* 02/28/2013 1240   NA 139 03/05/2012 1411   K 4.5 02/28/2013 1240   K 4.3 03/05/2012 1411   CL 92* 02/28/2013 1240   CL 99 03/05/2012 1411   CO2 31* 02/28/2013 1240   CO2 30 03/05/2012 1411   BUN 11.1 02/28/2013 1240   BUN 11 03/05/2012 1411   CREATININE 0.9 02/28/2013 1240   CREATININE 0.60 03/05/2012 1411      Component Value Date/Time   CALCIUM 10.7* 02/28/2013 1240   CALCIUM 9.9 03/05/2012 1411   ALKPHOS 20* 02/28/2013 1240   ALKPHOS 18* 03/05/2012  1411   AST 26 02/28/2013 1240   AST 19 03/05/2012 1411   ALT 22 02/28/2013 1240   ALT 14 03/05/2012 1411   BILITOT 0.40 02/28/2013 1240   BILITOT 0.3 03/05/2012 1411       RADIOGRAPHIC STUDIES:  No results found.  ASSESSMENT:  1. Ms. Zachman is a 77 year old female with history of stage IA ER positive, PR positive, HER-2/neu negative breast cancer of the left breast.  She has underwent a left mastectomy followed by the successful completion of a full 5 years of anti-estrogen therapy with Arimidex.  She is up to  date on her health maintenance.  2.  She does have a h/o of osteopenia.  Her PCP Dr. Modesto Charon follows this and she is on Fosamax.     PLAN:   1. Doing well.  No sign of recurrence.  We will add on labs to today's visit.  She will continue to have annual diagnostic mammograms.  Dr. Welton Flakes and I reviewed her health maintenance information in detail.  I encouraged her to quit smoking.    2. We will see Ms. Pullara back in one year for follow up or sooner if needed.   All questions were answered. The patient knows to call the clinic with any problems, questions or concerns. We can certainly see the patient much sooner if necessary.  I spent 40 minutes counseling the patient face to face. The total time spent in the appointment was 60 minutes.  This case was reviewed with Dr. Welton Flakes.  Cherie Ouch Lyn Hollingshead, NP Medical Oncology [facility] Phone: [phone] 03/02/2013, 8:51 AM

## 2013-03-02 DIAGNOSIS — C50912 Malignant neoplasm of unspecified site of left female breast: Secondary | ICD-10-CM | POA: Insufficient documentation

## 2013-03-05 ENCOUNTER — Ambulatory Visit: Payer: Medicare Other | Admitting: Oncology

## 2013-03-05 ENCOUNTER — Other Ambulatory Visit: Payer: Medicare Other | Admitting: Lab

## 2013-03-13 ENCOUNTER — Other Ambulatory Visit: Payer: Self-pay | Admitting: *Deleted

## 2013-03-13 MED ORDER — AMLODIPINE BESYLATE 10 MG PO TABS: 10.0000 mg | ORAL_TABLET | Freq: Every day | ORAL | Status: DC

## 2013-03-26 ENCOUNTER — Other Ambulatory Visit: Payer: Self-pay

## 2013-03-26 MED ORDER — METFORMIN HCL 500 MG PO TABS: ORAL_TABLET | ORAL | Status: DC

## 2013-03-26 NOTE — Telephone Encounter (Signed)
ok 

## 2013-04-15 ENCOUNTER — Other Ambulatory Visit: Payer: Self-pay | Admitting: *Deleted

## 2013-04-15 ENCOUNTER — Encounter: Payer: Self-pay | Admitting: *Deleted

## 2013-04-15 MED ORDER — LISINOPRIL 40 MG PO TABS: 40.0000 mg | ORAL_TABLET | Freq: Every day | ORAL | Status: DC

## 2013-04-24 ENCOUNTER — Other Ambulatory Visit: Payer: Self-pay | Admitting: Family Medicine

## 2013-04-25 NOTE — Telephone Encounter (Signed)
Last rf on xanax 03/26/13. Call in [facility].  LAST OV 1/14

## 2013-04-30 ENCOUNTER — Other Ambulatory Visit: Payer: Self-pay | Admitting: Family Medicine

## 2013-05-01 NOTE — Telephone Encounter (Signed)
Medication was authorized but no one called it in to the pharmacy.    Medication [facility].

## 2013-05-01 NOTE — Telephone Encounter (Signed)
LAST RF 03/26/13. CALL IN [facility] [place]. LAST OV 1/14.

## 2013-05-01 NOTE — Telephone Encounter (Signed)
[facility] [place]  

## 2013-05-01 NOTE — Telephone Encounter (Signed)
I think this  Was refilled on 04/24/2013. Someone needs to double check on this.

## 2013-05-02 ENCOUNTER — Ambulatory Visit (INDEPENDENT_AMBULATORY_CARE_PROVIDER_SITE_OTHER): Payer: Medicare Other | Admitting: Family Medicine

## 2013-05-02 ENCOUNTER — Encounter: Payer: Self-pay | Admitting: Family Medicine

## 2013-05-02 VITALS — BP 135/71 | HR 80 | Temp 98.5°F | Wt 126.6 lb

## 2013-05-02 DIAGNOSIS — M810 Age-related osteoporosis without current pathological fracture: Secondary | ICD-10-CM

## 2013-05-02 DIAGNOSIS — I10 Essential (primary) hypertension: Secondary | ICD-10-CM

## 2013-05-02 DIAGNOSIS — C50912 Malignant neoplasm of unspecified site of left female breast: Secondary | ICD-10-CM

## 2013-05-02 DIAGNOSIS — E119 Type 2 diabetes mellitus without complications: Secondary | ICD-10-CM

## 2013-05-02 DIAGNOSIS — C50919 Malignant neoplasm of unspecified site of unspecified female breast: Secondary | ICD-10-CM

## 2013-05-02 DIAGNOSIS — Z87891 Personal history of nicotine dependence: Secondary | ICD-10-CM

## 2013-05-02 DIAGNOSIS — R7989 Other specified abnormal findings of blood chemistry: Secondary | ICD-10-CM

## 2013-05-02 DIAGNOSIS — E785 Hyperlipidemia, unspecified: Secondary | ICD-10-CM

## 2013-05-02 LAB — POCT GLYCOSYLATED HEMOGLOBIN (HGB A1C): Hemoglobin A1C: 5.7

## 2013-05-02 LAB — COMPLETE METABOLIC PANEL WITH GFR
ALT: 13 U/L (ref 0–35)
AST: 18 U/L (ref 0–37)
Albumin: 4.7 g/dL (ref 3.5–5.2)
Alkaline Phosphatase: 17 U/L — ABNORMAL LOW (ref 39–117)
BUN: 10 mg/dL (ref 6–23)
CO2: 28 mEq/L (ref 19–32)
Calcium: 9.9 mg/dL (ref 8.4–10.5)
Chloride: 94 mEq/L — ABNORMAL LOW (ref 96–112)
Creat: 0.74 mg/dL (ref 0.50–1.10)
GFR, Est African American: >89 mL/min
GFR, Est Non African American: 80 mL/min
Glucose, Bld: 95 mg/dL (ref 70–99)
Potassium: 5.4 mEq/L — ABNORMAL HIGH (ref 3.5–5.3)
Sodium: 133 mEq/L — ABNORMAL LOW (ref 135–145)
Total Bilirubin: 0.4 mg/dL (ref 0.3–1.2)
Total Protein: 7.3 g/dL (ref 6.0–8.3)

## 2013-05-02 NOTE — Progress Notes (Signed)
Patient ID: Jacqueline Cervantes, female   DOB: 1935/05/24, 78 y.o.   MRN: [id number] SUBJECTIVE: HPI: Patient is here for follow up of Diabetes Mellitus/HTN/HLD  .Symptoms of DM:has had no Nocturia ,deniesUrinary Frequency ,denies Blurred vision ,deniesDizziness,denies.Dysuria,deniesparesthesias, deniesextremity pain or ulcers.Marland Kitchendenieschest pain. .[facility]an [facility] do [facility]. Does not check CBGs. Average CBG:________.Marland Kitchen deniesto episodes of hypoglycemia. doeshave an emergency hypoglycemic plan. admits toCompliance with medications. deniesProblems with medications.  Restarted to smoke again. Saw dermatology and had multiple lesions of her face arms and back treated.  Restarted smoking.  PMH/PSH: reviewed/updated in Epic  SH/FH: reviewed/updated in Epic  Allergies: reviewed/updated in Epic  Medications: reviewed/updated in Epic  Immunizations: reviewed/updated in Epic  ROS: As above in the HPI. All other systems are stable or negative.  OBJECTIVE: APPEARANCE:  Patient in no acute distress.The patient appeared well nourished and normally developed. Acyanotic. Waist: VITAL SIGNS:BP 135/71  Pulse 80  Temp(Src) 98.5 F (36.9 C) (Oral)  Wt 126 lb 9.6 oz (57.425 kg)  BMI 21.07 kg/m2   SKIN: warm and  Dry without overt rashes, tattoos and scars. Multiple lesions treated on face obvious. Sun damaged skin.  HEAD and Neck: without JVD, Head and scalp: normal Eyes:No scleral icterus. Fundi normal, eye movements normal. Ears: Auricle normal, canal normal, Tympanic membranes normal, insufflation normal. Nose: normal Throat: normal Neck & thyroid: normal  CHEST & LUNGS: Chest wall: normal Lungs: Coarse breath sounds.  CVS: Reveals the PMI to be normally located. Regular rhythm, First and Second Heart sounds are normal,  absence of murmurs, rubs or gallops. Peripheral vasculature: Radial pulses: normal Dorsal pedis pulses:  normal Posterior pulses: normal  ABDOMEN:  Appearance: normal Benign,, no organomegaly, no masses, no Abdominal Aortic enlargement. No Guarding , no rebound. No Bruits. Bowel sounds: normal  RECTAL: N/A GU: N/A  EXTREMETIES: nonedematous. Both Femoral and Pedal pulses are normal.  MUSCULOSKELETAL:  Spine:mild kyphosis  NEUROLOGIC: oriented to time,place and person; nonfocal. Strength is normal Sensory is normal Reflexes are normal Cranial Nerves are normal.  ASSESSMENT: HTN (hypertension) - Plan: COMPLETE METABOLIC PANEL WITH GFR  HLD (hyperlipidemia) - Plan: COMPLETE METABOLIC PANEL WITH GFR, NMR Lipoprofile with Lipids  DM (diabetes mellitus) - Plan: POCT glycosylated hemoglobin (Hb A1C)  Former smoker  Osteoporosis, unspecified - Plan: DG Bone Density  Breast cancer, left breast unfortunately she has restarted to smoke.   PLAN: Orders Placed This Encounter  Procedures  . DG Bone Density    Standing Status: Future     Number of Occurrences:      Standing Expiration Date: 07/02/2014    Scheduling Instructions:     Last DEXA 2011 in EPIC    Order Specific Question:  Reason for Exam (SYMPTOM  OR DIAGNOSIS REQUIRED)    Answer:  osteopenia/osteoporosis follow up    Order Specific Question:  Preferred imaging location?    Answer:  Internal  . COMPLETE METABOLIC PANEL WITH GFR  . NMR Lipoprofile with Lipids  . POCT glycosylated hemoglobin (Hb A1C)   No orders of the defined types were placed in this encounter.   Results for orders placed in visit on 05/02/13  POCT GLYCOSYLATED HEMOGLOBIN (HGB A1C)      Result Value Range   Hemoglobin A1C 5.7%      counselled on smoking cessation.  Emphasized need to be positive, and to take care of her health,  RTc in 3 months.  Handout in the AVS given.  Thelma Barge  Casimiro Needle, M.D.

## 2013-05-02 NOTE — Patient Instructions (Addendum)
Smoking Cessation Quitting smoking is important to your health and has many advantages. However, it is not always easy to quit since nicotine is a very addictive drug. Often times, people try 3 times or more before being able to quit. This document explains the best ways for you to prepare to quit smoking. Quitting takes hard work and a lot of effort, but you can do it. ADVANTAGES OF QUITTING SMOKING  You will live longer, feel better, and live better.  Your body will feel the impact of quitting smoking almost immediately.  Within 20 minutes, blood pressure decreases. Your pulse returns to its normal level.  After 8 hours, carbon monoxide levels in the blood return to normal. Your oxygen level increases.  After 24 hours, the chance of having a heart attack starts to decrease. Your breath, hair, and body stop smelling like smoke.  After 48 hours, damaged nerve endings begin to recover. Your sense of taste and smell improve.  After 72 hours, the body is virtually free of nicotine. Your bronchial tubes relax and breathing becomes easier.  After 2 to 12 weeks, lungs can hold more air. Exercise becomes easier and circulation improves.  The risk of having a heart attack, stroke, cancer, or lung disease is greatly reduced.  After 1 year, the risk of coronary heart disease is cut in half.  After 5 years, the risk of stroke falls to the same as a nonsmoker.  After 10 years, the risk of lung cancer is cut in half and the risk of other cancers decreases significantly.  After 15 years, the risk of coronary heart disease drops, usually to the level of a nonsmoker.  If you are pregnant, quitting smoking will improve your chances of having a healthy baby.  The people you live with, especially any children, will be healthier.  You will have extra money to spend on things other than cigarettes. QUESTIONS TO THINK ABOUT BEFORE ATTEMPTING TO QUIT You may want to talk about your answers with your  caregiver.  Why do you want to quit?  If you tried to quit in the past, what helped and what did not?  What will be the most difficult situations for you after you quit? How will you plan to handle them?  Who can help you through the tough times? Your family? Friends? A caregiver?  What pleasures do you get from smoking? What ways can you still get pleasure if you quit? Here are some questions to ask your caregiver:  How can you help me to be successful at quitting?  What medicine do you think would be best for me and how should I take it?  What should I do if I need more help?  What is smoking withdrawal like? How can I get information on withdrawal? GET READY  Set a quit date.  Change your environment by getting rid of all cigarettes, ashtrays, matches, and lighters in your home, car, or work. Do not let people smoke in your home.  Review your past attempts to quit. Think about what worked and what did not. GET SUPPORT AND ENCOURAGEMENT You have a better chance of being successful if you have help. You can get support in many ways.  Tell your family, friends, and co-workers that you are going to quit and need their support. Ask them not to smoke around you.  Get individual, group, or telephone counseling and support. Programs are available at local hospitals and health centers. Call your local health department for   information about programs in your area.  Spiritual beliefs and practices may help some smokers quit.  Download a "quit meter" on your computer to keep track of quit statistics, such as how long you have gone without smoking, cigarettes not smoked, and money saved.  Get a self-help book about quitting smoking and staying off of tobacco. LEARN NEW SKILLS AND BEHAVIORS  Distract yourself from urges to smoke. Talk to someone, go for a walk, or occupy your time with a task.  Change your normal routine. Take a different route to work. Drink tea instead of coffee.  Eat breakfast in a different place.  Reduce your stress. Take a hot bath, exercise, or read a book.  Plan something enjoyable to do every day. Reward yourself for not smoking.  Explore interactive web-based programs that specialize in helping you quit. GET MEDICINE AND USE IT CORRECTLY Medicines can help you stop smoking and decrease the urge to smoke. Combining medicine with the above behavioral methods and support can greatly increase your chances of successfully quitting smoking.  Nicotine replacement therapy helps deliver nicotine to your body without the negative effects and risks of smoking. Nicotine replacement therapy includes nicotine gum, lozenges, inhalers, nasal sprays, and skin patches. Some may be available over-the-counter and others require a prescription.  Antidepressant medicine helps people abstain from smoking, but how this works is unknown. This medicine is available by prescription.  Nicotinic receptor partial agonist medicine simulates the effect of nicotine in your brain. This medicine is available by prescription. Ask your caregiver for advice about which medicines to use and how to use them based on your health history. Your caregiver will tell you what side effects to look out for if you choose to be on a medicine or therapy. Carefully read the information on the package. Do not use any other product containing nicotine while using a nicotine replacement product.  RELAPSE OR DIFFICULT SITUATIONS Most relapses occur within the first 3 months after quitting. Do not be discouraged if you start smoking again. Remember, most people try several times before finally quitting. You may have symptoms of withdrawal because your body is used to nicotine. You may crave cigarettes, be irritable, feel very hungry, cough often, get headaches, or have difficulty concentrating. The withdrawal symptoms are only temporary. They are strongest when you first quit, but they will go away within  10 14 days. To reduce the chances of relapse, try to:  Avoid drinking alcohol. Drinking lowers your chances of successfully quitting.  Reduce the amount of caffeine you consume. Once you quit smoking, the amount of caffeine in your body increases and can give you symptoms, such as a rapid heartbeat, sweating, and anxiety.  Avoid smokers because they can make you want to smoke.  Do not let weight gain distract you. Many smokers will gain weight when they quit, usually less than 10 pounds. Eat a healthy diet and stay active. You can always lose the weight gained after you quit.  Find ways to improve your mood other than smoking. FOR MORE INFORMATION  www.smokefree.gov  Document Released: 12/06/2001 Document Revised: 06/12/2012 Document Reviewed: 03/22/2012 [employer] Patient Information 2013 [employer].  

## 2013-05-07 ENCOUNTER — Other Ambulatory Visit: Payer: Self-pay | Admitting: Family Medicine

## 2013-05-07 DIAGNOSIS — E875 Hyperkalemia: Secondary | ICD-10-CM

## 2013-05-07 LAB — NMR LIPOPROFILE WITH LIPIDS
Cholesterol, Total: 118 mg/dL (ref <200)
HDL Particle Number: 38.9 umol/L (ref >=30.5)
HDL Size: 9.4 nm (ref >=9.2)
HDL-C: 47 mg/dL (ref >=40)
LDL (calc): 48 mg/dL (ref <100)
LDL Particle Number: 777 nmol/L (ref <1000)
LDL Size: 19.7 nm — ABNORMAL LOW (ref >20.5)
LP-IR Score: 42 (ref <=45)
Large HDL-P: 3.9 umol/L — ABNORMAL LOW (ref >=4.8)
Large VLDL-P: 1.9 nmol/L (ref <=2.7)
Small LDL Particle Number: 527 nmol/L (ref <=527)
Triglycerides: 117 mg/dL (ref <150)
VLDL Size: 43.8 nm (ref <=46.6)

## 2013-05-07 NOTE — Progress Notes (Signed)
Quick Note:  Lab result at goal.except potassium is borderline elevated. May be lab error.need to repeat No change in Medications for now. No Change in other plans and follow up. ______

## 2013-05-08 NOTE — Addendum Note (Signed)
Addended by: Orma Render F on: 05/08/2013 05:33 PM   Modules accepted: Orders

## 2013-05-09 ENCOUNTER — Other Ambulatory Visit (INDEPENDENT_AMBULATORY_CARE_PROVIDER_SITE_OTHER): Payer: Medicare Other

## 2013-05-09 DIAGNOSIS — R7989 Other specified abnormal findings of blood chemistry: Secondary | ICD-10-CM

## 2013-05-09 LAB — BASIC METABOLIC PANEL WITH GFR
BUN: 16 mg/dL (ref 6–23)
CO2: 30 mEq/L (ref 19–32)
Calcium: 9.9 mg/dL (ref 8.4–10.5)
Chloride: 94 mEq/L — ABNORMAL LOW (ref 96–112)
Creat: 0.76 mg/dL (ref 0.50–1.10)
GFR, Est African American: 89 mL/min
GFR, Est Non African American: 77 mL/min
Glucose, Bld: 127 mg/dL — ABNORMAL HIGH (ref 70–99)
Potassium: 5.3 mEq/L (ref 3.5–5.3)
Sodium: 134 mEq/L — ABNORMAL LOW (ref 135–145)

## 2013-05-09 NOTE — Progress Notes (Signed)
Quick Note:  Call patient. Labs: potassium is normal. No change in plan. ______

## 2013-05-09 NOTE — Progress Notes (Signed)
Patient came in today for labs only. 

## 2013-05-13 ENCOUNTER — Other Ambulatory Visit: Payer: Self-pay | Admitting: Nurse Practitioner

## 2013-05-31 ENCOUNTER — Telehealth: Payer: Self-pay | Admitting: Nurse Practitioner

## 2013-06-03 ENCOUNTER — Other Ambulatory Visit: Payer: Self-pay | Admitting: Gastroenterology

## 2013-06-03 ENCOUNTER — Ambulatory Visit
Admission: RE | Admit: 2013-06-03 | Discharge: 2013-06-03 | Disposition: A | Discharge status: Home / Self Care | Payer: Medicare Other | Source: Ambulatory Visit | Attending: Gastroenterology | Admitting: Gastroenterology

## 2013-06-03 DIAGNOSIS — R109 Unspecified abdominal pain: Secondary | ICD-10-CM

## 2013-06-03 DIAGNOSIS — K5900 Constipation, unspecified: Secondary | ICD-10-CM

## 2013-06-14 ENCOUNTER — Other Ambulatory Visit: Payer: Self-pay | Admitting: Gastroenterology

## 2013-06-14 DIAGNOSIS — Z8719 Personal history of other diseases of the digestive system: Secondary | ICD-10-CM

## 2013-06-14 DIAGNOSIS — R109 Unspecified abdominal pain: Secondary | ICD-10-CM

## 2013-06-18 ENCOUNTER — Ambulatory Visit
Admission: RE | Admit: 2013-06-18 | Discharge: 2013-06-18 | Disposition: A | Discharge status: Home / Self Care | Payer: Medicare Other | Source: Ambulatory Visit | Attending: Gastroenterology | Admitting: Gastroenterology

## 2013-06-18 DIAGNOSIS — R109 Unspecified abdominal pain: Secondary | ICD-10-CM

## 2013-06-18 DIAGNOSIS — Z8719 Personal history of other diseases of the digestive system: Secondary | ICD-10-CM

## 2013-06-18 MED ORDER — IOHEXOL 300 MG/ML  SOLN
100.0000 mL | Freq: Once | INTRAMUSCULAR | Status: AC | PRN
  Administered 2013-06-18: 100 mL via INTRAVENOUS

## 2013-06-25 ENCOUNTER — Other Ambulatory Visit: Payer: Self-pay | Admitting: Family Medicine

## 2013-06-25 NOTE — Telephone Encounter (Signed)
Last seen 05/02/13  [facility]   If approved -phone in and have nurse notify patient

## 2013-06-26 ENCOUNTER — Other Ambulatory Visit: Payer: Self-pay | Admitting: Family Medicine

## 2013-06-26 NOTE — Telephone Encounter (Signed)
Please call into pharmacy. thx 

## 2013-06-26 NOTE — Telephone Encounter (Signed)
Called in.

## 2013-07-10 ENCOUNTER — Ambulatory Visit (INDEPENDENT_AMBULATORY_CARE_PROVIDER_SITE_OTHER): Payer: Medicare Other | Admitting: Pharmacist

## 2013-07-10 ENCOUNTER — Other Ambulatory Visit (INDEPENDENT_AMBULATORY_CARE_PROVIDER_SITE_OTHER): Payer: Medicare Other

## 2013-07-10 VITALS — Ht 64.0 in | Wt 124.0 lb

## 2013-07-10 DIAGNOSIS — M810 Age-related osteoporosis without current pathological fracture: Secondary | ICD-10-CM

## 2013-07-10 NOTE — Progress Notes (Signed)
Patient ID: Jacqueline Cervantes, female   DOB: January 27, 1935, 78 y.o.   MRN: [id number] [facility] Current Height: Height: 5\' 4"  (162.6 cm)      Max Lifetime Height:  5\' 4"  Current Weight: Weight: 124 lb (56.246 kg)       Ethnicity:Caucasian    HPI: Does pt already have a diagnosis of:  Osteoporosis?  Yes  Back Pain?  No       Kyphosis?  Yes Prior fracture?  Yes - history of vertebral fracture Med(s) for Osteoporosis/Osteopenia:  alendronate 70mg  1 tablet weekly and calcium 1 daily Med(s) previously tried for Osteoporosis/Osteopenia:  none                                                            PMH: Age at menopause:  78 years old Hysterectomy?  Yes Oophorectomy?  No HRT? No Steroid Use?  No Thyroid med?  No History of cancer?  Yes - breast cancer with mastectomy then took tamoxifen for 5 year. No chemo or radiation.  History of digestive disorders (ie Crohn's)?  no Current or previous eating disorders?  No Last Vitamin D Result:  49 (04/11/2012) Last GFR Result:  77 (05/09/2013)   FH/SH: Family history of osteoporosis?  No Parent with history of hip fracture?  No Family history of breast cancer?  No Exercise?  No Smoking?  Yes - had quit 10/2012 bu restarted Alcohol?  No    Calcium Assessment Calcium Intake  # of servings/day  Calcium mg  Milk (8 oz) 0.5  x  300  = 150mg   Yogurt (4 oz) 0 x  200 = 0  Cheese (1 oz) 0 x  200 = 0  Other Calcium sources   250mg   Ca supplement 600mg  dailiy = 600mg    Estimated calcium intake per day 1000mg     DEXA Results Date of Test T-Score for AP Spine L1-L4 T-Score for Total Left Hip T-Score for Total Right Hip  07/10/2013 0.4 -1.6 -1.3       03/29/2006 [facility] from [facility] -0.8 -1.6  * wards = -2.9 -1.2  *wards = -2.3         Assessment: Osteoporosis with History of L-1 compression fracture - BMD stable over last 7 years since started alendronate Low calcium intake  Recommendations: 1.  Contine   alendronate (FOSAMAX) 70mg  1 tablet weekly 2.  recommend calcium 1200mg  daily through supplementation or diet.  Suggested she use a calcium supplement with magnesium - may cause less constipation. 3.  recommend weight bearing exercise - 30 minutes at least 4 days  per week.   4.  Counseled and educated about fall risk and prevention.  Recheck DEXA:  2 years  Time spent counseling patient:  20 minutes

## 2013-07-10 NOTE — Patient Instructions (Addendum)

## 2013-07-13 ENCOUNTER — Other Ambulatory Visit: Payer: Self-pay | Admitting: Family Medicine

## 2013-07-22 ENCOUNTER — Other Ambulatory Visit: Payer: Self-pay | Admitting: Family Medicine

## 2013-07-25 ENCOUNTER — Other Ambulatory Visit: Payer: Self-pay | Admitting: *Deleted

## 2013-07-25 MED ORDER — ALPRAZOLAM 0.5 MG PO TABS: ORAL_TABLET | ORAL | Status: DC

## 2013-07-25 NOTE — Telephone Encounter (Signed)
Patient last seen in office on 05-12-13. Rx last filled on 06-26-13. Please advise. If approved please route to pool and have [facility] [place]

## 2013-07-26 NOTE — Telephone Encounter (Signed)
[facility] [place] 

## 2013-08-02 ENCOUNTER — Encounter: Payer: Self-pay | Admitting: Family Medicine

## 2013-08-02 ENCOUNTER — Ambulatory Visit (INDEPENDENT_AMBULATORY_CARE_PROVIDER_SITE_OTHER): Payer: Medicare Other | Admitting: Family Medicine

## 2013-08-02 VITALS — BP 128/80 | HR 82 | Temp 97.9°F | Wt 125.2 lb

## 2013-08-02 DIAGNOSIS — F17201 Nicotine dependence, unspecified, in remission: Secondary | ICD-10-CM | POA: Insufficient documentation

## 2013-08-02 DIAGNOSIS — J449 Chronic obstructive pulmonary disease, unspecified: Secondary | ICD-10-CM

## 2013-08-02 DIAGNOSIS — F17200 Nicotine dependence, unspecified, uncomplicated: Secondary | ICD-10-CM

## 2013-08-02 DIAGNOSIS — C50912 Malignant neoplasm of unspecified site of left female breast: Secondary | ICD-10-CM

## 2013-08-02 DIAGNOSIS — E119 Type 2 diabetes mellitus without complications: Secondary | ICD-10-CM

## 2013-08-02 DIAGNOSIS — Z720 Tobacco use: Secondary | ICD-10-CM

## 2013-08-02 DIAGNOSIS — E785 Hyperlipidemia, unspecified: Secondary | ICD-10-CM

## 2013-08-02 DIAGNOSIS — F411 Generalized anxiety disorder: Secondary | ICD-10-CM

## 2013-08-02 DIAGNOSIS — I10 Essential (primary) hypertension: Secondary | ICD-10-CM

## 2013-08-02 DIAGNOSIS — K589 Irritable bowel syndrome without diarrhea: Secondary | ICD-10-CM

## 2013-08-02 DIAGNOSIS — M810 Age-related osteoporosis without current pathological fracture: Secondary | ICD-10-CM

## 2013-08-02 DIAGNOSIS — C50919 Malignant neoplasm of unspecified site of unspecified female breast: Secondary | ICD-10-CM

## 2013-08-02 LAB — POCT CBC
Granulocyte percent: 63.6 %G (ref 37–80)
HCT, POC: 41.4 % (ref 37.7–47.9)
Hemoglobin: 13.9 g/dL (ref 12.2–16.2)
Lymph, poc: 2.6 (ref 0.6–3.4)
MCH, POC: 27.3 pg (ref 27–31.2)
MCHC: 33.5 g/dL (ref 31.8–35.4)
MCV: 81.2 fL (ref 80–97)
MPV: 7.8 fL (ref 0–99.8)
POC Granulocyte: 5.5 (ref 2–6.9)
POC LYMPH PERCENT: 30.2 %L (ref 10–50)
Platelet Count, POC: 264 10*3/uL (ref 142–424)
RBC: 5.1 M/uL (ref 4.04–5.48)
RDW, POC: 17 %
WBC: 8.6 10*3/uL (ref 4.6–10.2)

## 2013-08-02 MED ORDER — ATORVASTATIN CALCIUM 40 MG PO TABS: 40.0000 mg | ORAL_TABLET | Freq: Every day | ORAL | Status: DC

## 2013-08-02 MED ORDER — DICYCLOMINE HCL 10 MG PO CAPS: 10.0000 mg | ORAL_CAPSULE | Freq: Two times a day (BID) | ORAL | Status: DC

## 2013-08-02 MED ORDER — CITALOPRAM HYDROBROMIDE 20 MG PO TABS: ORAL_TABLET | ORAL | Status: DC

## 2013-08-02 MED ORDER — ALPRAZOLAM 0.5 MG PO TABS: ORAL_TABLET | ORAL | Status: DC

## 2013-08-02 NOTE — Progress Notes (Signed)
Patient ID: Jacqueline Cervantes, female   DOB: 11/06/1935, 78 y.o.   MRN: [id number] SUBJECTIVE: CC: Chief Complaint  Patient presents with  . Follow-up    46month states saw GI Dr Dulce Sellar and did XR's which indicated per pt "thickness" and Dr Tamsen Roers suggested surgery  . Medication Refill    celexa alprazolam and atorvastatin     HPI: Has appointment with the surgeon to evaluate the rectum soon.  Patient is here for follow up of Diabetes Mellitus: Symptoms evaluated: Denies Nocturia ,Denies Urinary Frequency , denies Blurred vision ,deniesDizziness,denies.Dysuria,denies paresthesias, denies extremity pain or ulcers.Marland Kitchendenies chest pain. has had an annual eye exam. do check the feet. Does check CBGs. Average WUJ:[id number] Denies episodes of hypoglycemia. Does have an emergency hypoglycemic plan. admits toCompliance with medications. Denies Problems with medications.  Past Medical History  Diagnosis Date  . Hypercholesteremia   . Diabetes mellitus   . History of breast cancer   . Arthritis   . Hypertension    Past Surgical History  Procedure Laterality Date  . Abdominal hysterectomy    . Breast surgery  05/11/2006    mastectomy   History   Social History  . Marital Status: Married    Spouse Name: N/A    Number of Children: N/A  . Years of Education: N/A   Occupational History  . Not on file.   Social History Main Topics  . Smoking status: Current Every Day Smoker -- 1.00 packs/day  . Smokeless tobacco: Not on file     Comment: pt quit 10/2012 with help of chantix  . Alcohol Use: No  . Drug Use: No  . Sexually Active: Not on file   Other Topics Concern  . Not on file   Social History Narrative  . No narrative on file   No family history on file. Current Outpatient Prescriptions on File Prior to Visit  Medication Sig Dispense Refill  . alendronate (FOSAMAX) 70 MG tablet Take 70 mg by mouth every 7 (seven) days. Take with a full glass of water on an empty stomach.       Marland Kitchen amLODipine (NORVASC) 10 MG tablet TAKE 1 TABLET (10 MG TOTAL) BY MOUTH DAILY.  30 tablet  3  . Calcium Carbonate-Vitamin D (CALCIUM 600+D) 600-400 MG-UNIT per tablet Take 1 tablet by mouth daily.        . Cholecalciferol (VITAMIN D3) 2000 UNITS TABS Take 1 capsule by mouth daily.        Marland Kitchen docusate sodium (COLACE) 50 MG capsule Take by mouth 2 (two) times daily.        . fenofibrate 160 MG tablet TAKE 1 TABLET BY MOUTH AT BEDTIME  30 tablet  5  . hydrochlorothiazide (MICROZIDE) 12.5 MG capsule TAKE 1 CAPSULE BY MOUTH DAILY  30 capsule  3  . lisinopril (PRINIVIL,ZESTRIL) 40 MG tablet TAKE 1 TABLET (40 MG TOTAL) BY MOUTH DAILY.  30 tablet  2  . metFORMIN (GLUCOPHAGE) 500 MG tablet Take 2 tablets in AM and 1 tablet in PM  90 tablet  1  . Omega-3 Fatty Acids (FISH OIL) 1200 MG CAPS Take 1 capsule by mouth daily.        . Polyethylene Glycol 3350 (MIRALAX PO) Take by mouth 2 (two) times daily.       No current facility-administered medications on file prior to visit.   No Known Allergies  There is no immunization history on file for this patient. Prior to Admission medications   Medication Sig Start  Date End Date Taking? Authorizing Provider  alendronate (FOSAMAX) 70 MG tablet Take 70 mg by mouth every 7 (seven) days. Take with a full glass of water on an empty stomach.   Yes Historical Provider, MD  ALPRAZolam Prudy Feeler) 0.5 MG tablet TAKE 1 TABLET BY MOUTH TWICE A DAY 08/02/13  Yes Ileana Ladd, MD  amLODipine (NORVASC) 10 MG tablet TAKE 1 TABLET (10 MG TOTAL) BY MOUTH DAILY. 07/13/13  Yes Ileana Ladd, MD  atorvastatin (LIPITOR) 40 MG tablet Take 1 tablet (40 mg total) by mouth daily. 08/02/13  Yes Ileana Ladd, MD  Calcium Carbonate-Vitamin D (CALCIUM 600+D) 600-400 MG-UNIT per tablet Take 1 tablet by mouth daily.     Yes Historical Provider, MD  Cholecalciferol (VITAMIN D3) 2000 UNITS TABS Take 1 capsule by mouth daily.     Yes Historical Provider, MD  citalopram (CELEXA) 20 MG tablet TAKE  1 TABLET EVERY DAY 08/02/13  Yes Ileana Ladd, MD  dicyclomine (BENTYL) 10 MG capsule Take 1 capsule (10 mg total) by mouth 2 (two) times daily. 08/02/13  Yes Ileana Ladd, MD  docusate sodium (COLACE) 50 MG capsule Take by mouth 2 (two) times daily.     Yes Historical Provider, MD  fenofibrate 160 MG tablet TAKE 1 TABLET BY MOUTH AT BEDTIME 05/13/13  Yes Ernestina Penna, MD  hydrochlorothiazide (MICROZIDE) 12.5 MG capsule TAKE 1 CAPSULE BY MOUTH DAILY 07/22/13  Yes Ileana Ladd, MD  lisinopril (PRINIVIL,ZESTRIL) 40 MG tablet TAKE 1 TABLET (40 MG TOTAL) BY MOUTH DAILY. 07/13/13  Yes Ileana Ladd, MD  metFORMIN (GLUCOPHAGE) 500 MG tablet Take 2 tablets in AM and 1 tablet in PM 03/26/13  Yes Ileana Ladd, MD  Omega-3 Fatty Acids (FISH OIL) 1200 MG CAPS Take 1 capsule by mouth daily.     Yes Historical Provider, MD  Polyethylene Glycol 3350 (MIRALAX PO) Take by mouth 2 (two) times daily.   Yes Historical Provider, MD  Probiotic Product (ALIGN PO) Take 1 capsule by mouth daily.   Yes Historical Provider, MD     ROS: As above in the HPI. All other systems are stable or negative.  OBJECTIVE: APPEARANCE:  Patient in no acute distress.The patient appeared well nourished and normally developed. Acyanotic. Waist: VITAL SIGNS:BP 128/80  Pulse 82  Temp(Src) 97.9 F (36.6 C) (Oral)  Wt 125 lb 3.2 oz (56.79 kg)  BMI 21.48 kg/m2 Female  SKIN: warm and  Dry without overt rashes, tattoos and scars  HEAD and Neck: without JVD, Head and scalp: normal Eyes:No scleral icterus. Fundi normal, eye movements normal. Ears: Auricle normal, canal normal, Tympanic membranes normal, insufflation normal. Nose: normal Throat: normal Neck & thyroid: normal  CHEST & LUNGS: Chest wall: normal Lungs: Clear  CVS: Reveals the PMI to be normally located. Regular rhythm, First and Second Heart sounds are normal,  absence of murmurs, rubs or gallops. Peripheral vasculature: Radial pulses: normal Dorsal pedis  pulses: normal Posterior pulses: normal  ABDOMEN:  Appearance: normal Benign, no organomegaly, no masses, no Abdominal Aortic enlargement. No Guarding , no rebound. No Bruits. Bowel sounds: normal  RECTAL: N/A GU: N/A  EXTREMETIES: nonedematous. Both Femoral and Pedal pulses are normal.  MUSCULOSKELETAL:  Spine: normal Joints: intact  NEUROLOGIC: oriented to time,place and person; nonfocal.    ASSESSMENT: Breast cancer, left breast  DM (diabetes mellitus) - Plan: CMP14+EGFR  HLD (hyperlipidemia) - Plan: atorvastatin (LIPITOR) 40 MG tablet, CMP14+EGFR, NMR, lipoprofile  HTN (hypertension) - Plan: CMP14+EGFR  Osteoporosis, unspecified  Tobacco user  COPD (chronic obstructive pulmonary disease) - Plan: POCT CBC  Anxiety state, unspecified - Plan: citalopram (CELEXA) 20 MG tablet, ALPRAZolam (XANAX) 0.5 MG tablet  Spastic colon - Plan: dicyclomine (BENTYL) 10 MG capsule  PLAN:       Dr Woodroe Mode Recommendations  Diet and Exercise discussed with patient.  For nutrition information, I recommend books:  1).Eat to Live by Dr Monico Hoar. 2).Prevent and Reverse Heart Disease by Dr Suzzette Righter. 3) Dr Katherina Right Book:  Program to Reverse Diabetes  Exercise recommendations are:  If unable to walk, then the patient can exercise in a chair 3 times a day. By flapping arms like a bird gently and raising legs outwards to the front.  If ambulatory, the patient can go for walks for 30 minutes 3 times a week. Then increase the intensity and duration as tolerated.  Goal is to try to attain exercise frequency to 5 times a week.  If applicable: Best to perform resistance exercises (machines or weights) 2 days a week and cardio type exercises 3 days per week.  Handouts on smoking cessation and DM placed in the AVS for patient to review and reinforce needed self care and steps to protect health. Counseled on need to stop smoking and  Steps she can take.  Patient not ready nor willing to give up totally tobacco, but may reduce the amounts.  Orders Placed This Encounter  Procedures  . CMP14+EGFR  . NMR, lipoprofile  . POCT CBC    Meds ordered this encounter  Medications  . DISCONTD: dicyclomine (BENTYL) 10 MG capsule    Sig: Take 10 mg by mouth 2 (two) times daily.   . Probiotic Product (ALIGN PO)    Sig: Take 1 capsule by mouth daily.  . citalopram (CELEXA) 20 MG tablet    Sig: TAKE 1 TABLET EVERY DAY    Dispense:  30 tablet    Refill:  11  . ALPRAZolam (XANAX) 0.5 MG tablet    Sig: TAKE 1 TABLET BY MOUTH TWICE A DAY    Dispense:  60 tablet    Refill:  1  . atorvastatin (LIPITOR) 40 MG tablet    Sig: Take 1 tablet (40 mg total) by mouth daily.    Dispense:  30 tablet    Refill:  11  . dicyclomine (BENTYL) 10 MG capsule    Sig: Take 1 capsule (10 mg total) by mouth 2 (two) times daily.    Dispense:  60 capsule    Refill:  5    Return in about 3 months (around 11/02/2013) for Recheck medical problems.  Terrin Meddaugh P. [place] Charon, M.D.

## 2013-08-02 NOTE — Patient Instructions (Addendum)
    Dr Christeena Krogh's Recommendations  Diet and Exercise discussed with patient.  For nutrition information, I recommend books:  1).Eat to Live by Dr Joel Fuhrman. 2).Prevent and Reverse Heart Disease by Dr Caldwell Esselstyn. 3) Dr Neal Barnard's Book:  Program to Reverse Diabetes  Exercise recommendations are:  If unable to walk, then the patient can exercise in a chair 3 times a day. By flapping arms like a bird gently and raising legs outwards to the front.  If ambulatory, the patient can go for walks for 30 minutes 3 times a week. Then increase the intensity and duration as tolerated.  Goal is to try to attain exercise frequency to 5 times a week.  If applicable: Best to perform resistance exercises (machines or weights) 2 days a week and cardio type exercises 3 days per week.    Smoking Cessation Quitting smoking is important to your health and has many advantages. However, it is not always easy to quit since nicotine is a very addictive drug. Often times, people try 3 times or more before being able to quit. This document explains the best ways for you to prepare to quit smoking. Quitting takes hard work and a lot of effort, but you can do it. ADVANTAGES OF QUITTING SMOKING  You will live longer, feel better, and live better.  Your body will feel the impact of quitting smoking almost immediately.  Within 20 minutes, blood pressure decreases. Your pulse returns to its normal level.  After 8 hours, carbon monoxide levels in the blood return to normal. Your oxygen level increases.  After 24 hours, the chance of having a heart attack starts to decrease. Your breath, hair, and body stop smelling like smoke.  After 48 hours, damaged nerve endings begin to recover. Your sense of taste and smell improve.  After 72 hours, the body is virtually free of nicotine. Your bronchial tubes relax and breathing becomes easier.  After 2 to 12 weeks, lungs can hold more air. Exercise  becomes easier and circulation improves.  The risk of having a heart attack, stroke, cancer, or lung disease is greatly reduced.  After 1 year, the risk of coronary heart disease is cut in half.  After 5 years, the risk of stroke falls to the same as a nonsmoker.  After 10 years, the risk of lung cancer is cut in half and the risk of other cancers decreases significantly.  After 15 years, the risk of coronary heart disease drops, usually to the level of a nonsmoker.  If you are pregnant, quitting smoking will improve your chances of having a healthy baby.  The people you live with, especially any children, will be healthier.  You will have extra money to spend on things other than cigarettes. QUESTIONS TO THINK ABOUT BEFORE ATTEMPTING TO QUIT You may want to talk about your answers with your caregiver.  Why do you want to quit?  If you tried to quit in the past, what helped and what did not?  What will be the most difficult situations for you after you quit? How will you plan to handle them?  Who can help you through the tough times? Your family? Friends? A caregiver?  What pleasures do you get from smoking? What ways can you still get pleasure if you quit? Here are some questions to ask your caregiver:  How can you help me to be successful at quitting?  What medicine do you think would be best for me and how   should I take it?  What should I do if I need more help?  What is smoking withdrawal like? How can I get information on withdrawal? GET READY  Set a quit date.  Change your environment by getting rid of all cigarettes, ashtrays, matches, and lighters in your home, car, or work. Do not let people smoke in your home.  Review your past attempts to quit. Think about what worked and what did not. GET SUPPORT AND ENCOURAGEMENT You have a better chance of being successful if you have help. You can get support in many ways.  Tell your family, friends, and co-workers that  you are going to quit and need their support. Ask them not to smoke around you.  Get individual, group, or telephone counseling and support. Programs are available at local hospitals and health centers. Call your local health department for information about programs in your area.  Spiritual beliefs and practices may help some smokers quit.  Download a "quit meter" on your computer to keep track of quit statistics, such as how long you have gone without smoking, cigarettes not smoked, and money saved.  Get a self-help book about quitting smoking and staying off of tobacco. LEARN NEW SKILLS AND BEHAVIORS  Distract yourself from urges to smoke. Talk to someone, go for a walk, or occupy your time with a task.  Change your normal routine. Take a different route to work. Drink tea instead of coffee. Eat breakfast in a different place.  Reduce your stress. Take a hot bath, exercise, or read a book.  Plan something enjoyable to do every day. Reward yourself for not smoking.  Explore interactive web-based programs that specialize in helping you quit. GET MEDICINE AND USE IT CORRECTLY Medicines can help you stop smoking and decrease the urge to smoke. Combining medicine with the above behavioral methods and support can greatly increase your chances of successfully quitting smoking.  Nicotine replacement therapy helps deliver nicotine to your body without the negative effects and risks of smoking. Nicotine replacement therapy includes nicotine gum, lozenges, inhalers, nasal sprays, and skin patches. Some may be available over-the-counter and others require a prescription.  Antidepressant medicine helps people abstain from smoking, but how this works is unknown. This medicine is available by prescription.  Nicotinic receptor partial agonist medicine simulates the effect of nicotine in your brain. This medicine is available by prescription. Ask your caregiver for advice about which medicines to use  and how to use them based on your health history. Your caregiver will tell you what side effects to look out for if you choose to be on a medicine or therapy. Carefully read the information on the package. Do not use any other product containing nicotine while using a nicotine replacement product.  RELAPSE OR DIFFICULT SITUATIONS Most relapses occur within the first 3 months after quitting. Do not be discouraged if you start smoking again. Remember, most people try several times before finally quitting. You may have symptoms of withdrawal because your body is used to nicotine. You may crave cigarettes, be irritable, feel very hungry, cough often, get headaches, or have difficulty concentrating. The withdrawal symptoms are only temporary. They are strongest when you first quit, but they will go away within 10 14 days. To reduce the chances of relapse, try to:  Avoid drinking alcohol. Drinking lowers your chances of successfully quitting.  Reduce the amount of caffeine you consume. Once you quit smoking, the amount of caffeine in your body increases and can give   you symptoms, such as a rapid heartbeat, sweating, and anxiety.  Avoid smokers because they can make you want to smoke.  Do not let weight gain distract you. Many smokers will gain weight when they quit, usually less than 10 pounds. Eat a healthy diet and stay active. You can always lose the weight gained after you quit.  Find ways to improve your mood other than smoking. FOR MORE INFORMATION  www.smokefree.gov  Document Released: 12/06/2001 Document Revised: 06/12/2012 Document Reviewed: 03/22/2012 [employer] Patient Information 2014 [employer].   Diabetes and Foot Care Diabetes may cause you to have a poor blood supply (circulation) to your legs and feet. Because of this, the skin may be thinner, break easier, and heal more slowly. You also may have nerve damage in your legs and feet causing decreased feeling. You may not notice minor  injuries to your feet that could lead to serious problems or infections. Taking care of your feet is one of the most important things you can do for yourself.  HOME CARE INSTRUCTIONS  Do not go barefoot. Bare feet are easily injured.  Check your feet daily for blisters, cuts, and redness.  Wash your feet with warm water (not hot) and mild soap. Pat your feet and between your toes until completely dry.  Apply a moisturizing lotion that does not contain alcohol or petroleum jelly to the dry skin on your feet and to dry brittle toenails. Do not put it between your toes.  Trim your toenails straight across. Do not dig under them or around the cuticle.  Do not cut corns or calluses, or try to remove them with medicine.  Wear clean cotton socks or stockings every day. Make sure they are not too tight. Do not wear knee high stockings since they may decrease blood flow to your legs.  Wear leather shoes that fit properly and have enough cushioning. To break in new shoes, wear them just a few hours a day to avoid injuring your feet.  Wear shoes at all times, even in the house.  Do not cross your legs. This may decrease the blood flow to your feet.  If you find a minor scrape, cut, or break in the skin on your feet, keep it and the skin around it clean and dry. These areas may be cleansed with mild soap and water. Do not use peroxide, alcohol, iodine or Merthiolate.  When you remove an adhesive bandage, be sure not to harm the skin around it.  If you have a wound, look at it several times a day to make sure it is healing.  Do not use heating pads or hot water bottles. Burns can occur. If you have lost feeling in your feet or legs, you may not know it is happening until it is too late.  Report any cuts, sores or bruises to your caregiver. Do not wait! SEEK MEDICAL CARE IF:   You have an injury that is not healing or you notice redness, numbness, burning, or tingling.  Your feet always feel  cold.  You have pain or cramps in your legs and feet. SEEK IMMEDIATE MEDICAL CARE IF:   There is increasing redness, swelling, or increasing pain in the wound.  There is a red line that goes up your leg.  Pus is coming from a wound.  You develop an unexplained oral temperature above 102 F (38.9 C), or as your caregiver suggests.  You notice a bad smell coming from an ulcer or wound. MAKE   SURE YOU:   Understand these instructions.  Will watch your condition.  Will get help right away if you are not doing well or get worse. Document Released: 12/09/2000 Document Revised: 03/05/2012 Document Reviewed: 06/17/2009 [employer] Patient Information 2014 [employer].  

## 2013-08-06 LAB — CMP14+EGFR
ALT: 13 IU/L (ref 0–32)
AST: 18 IU/L (ref 0–40)
Albumin/Globulin Ratio: 1.8 (ref 1.1–2.5)
Albumin: 4.6 g/dL (ref 3.5–4.8)
Alkaline Phosphatase: 20 IU/L — ABNORMAL LOW (ref 39–117)
BUN/Creatinine Ratio: 18 (ref 11–26)
BUN: 12 mg/dL (ref 8–27)
CO2: 27 mmol/L (ref 18–29)
Calcium: 9.9 mg/dL (ref 8.6–10.2)
Chloride: 93 mmol/L — ABNORMAL LOW (ref 97–108)
Creatinine, Ser: 0.67 mg/dL (ref 0.57–1.00)
GFR calc Af Amer: 99 mL/min/{1.73_m2} (ref >59)
GFR calc non Af Amer: 86 mL/min/{1.73_m2} (ref >59)
Globulin, Total: 2.5 g/dL (ref 1.5–4.5)
Glucose: 89 mg/dL (ref 65–99)
Potassium: 5.5 mmol/L — ABNORMAL HIGH (ref 3.5–5.2)
Sodium: 136 mmol/L (ref 134–144)
Total Bilirubin: 0.2 mg/dL (ref 0.0–1.2)
Total Protein: 7.1 g/dL (ref 6.0–8.5)

## 2013-08-06 LAB — NMR, LIPOPROFILE
Cholesterol: 137 mg/dL (ref <200)
HDL Cholesterol by NMR: 53 mg/dL (ref >=40)
HDL Particle Number: 39.5 umol/L (ref >=30.5)
LDL Particle Number: 1049 nmol/L — ABNORMAL HIGH (ref <1000)
LDL Size: 20.2 nm — ABNORMAL LOW (ref >20.5)
LDLC SERPL CALC-MCNC: 66 mg/dL (ref <100)
LP-IR Score: 44 (ref <=45)
Small LDL Particle Number: 800 nmol/L — ABNORMAL HIGH (ref <=527)
Triglycerides by NMR: 88 mg/dL (ref <150)

## 2013-08-07 ENCOUNTER — Other Ambulatory Visit: Payer: Self-pay | Admitting: Family Medicine

## 2013-08-07 DIAGNOSIS — E875 Hyperkalemia: Secondary | ICD-10-CM

## 2013-08-07 NOTE — Progress Notes (Signed)
Quick Note:  Lab result at or close to goal. No change in Medications for now. Potassium is a little high. Will need to recheck a BMP, ASAP. Ordered in EPIC ______

## 2013-08-09 ENCOUNTER — Other Ambulatory Visit (INDEPENDENT_AMBULATORY_CARE_PROVIDER_SITE_OTHER): Payer: Medicare Other

## 2013-08-09 DIAGNOSIS — E875 Hyperkalemia: Secondary | ICD-10-CM

## 2013-08-09 NOTE — Progress Notes (Signed)
Patient came in for labs only.

## 2013-08-10 LAB — BMP8+EGFR
BUN/Creatinine Ratio: 12 (ref 11–26)
BUN: 10 mg/dL (ref 8–27)
CO2: 30 mmol/L — ABNORMAL HIGH (ref 18–29)
Calcium: 11.3 mg/dL — ABNORMAL HIGH (ref 8.6–10.2)
Chloride: 90 mmol/L — ABNORMAL LOW (ref 97–108)
Creatinine, Ser: 0.81 mg/dL (ref 0.57–1.00)
GFR calc Af Amer: 82 mL/min/{1.73_m2} (ref >59)
GFR calc non Af Amer: 71 mL/min/{1.73_m2} (ref >59)
Glucose: 101 mg/dL — ABNORMAL HIGH (ref 65–99)
Potassium: 4.7 mmol/L (ref 3.5–5.2)
Sodium: 136 mmol/L (ref 134–144)

## 2013-08-11 ENCOUNTER — Other Ambulatory Visit: Payer: Self-pay | Admitting: Family Medicine

## 2013-08-11 NOTE — Progress Notes (Signed)
Quick Note:  Labs abnormal. Potassium is fine, but the calcium is now high. This could be a lab error. Need repeat with ionized calcium and PTH. Orders placed in EPIC. For labs ASAP. Will determine with the additonal labs whether we need to stop the calcium tablets. ______

## 2013-08-17 ENCOUNTER — Other Ambulatory Visit: Payer: Self-pay | Admitting: Nurse Practitioner

## 2013-08-23 ENCOUNTER — Other Ambulatory Visit: Payer: Self-pay | Admitting: Nurse Practitioner

## 2013-08-23 ENCOUNTER — Other Ambulatory Visit: Payer: Self-pay | Admitting: Family Medicine

## 2013-08-27 NOTE — Progress Notes (Signed)
Looks like [place] Charon ordered xanax on her office visit on 08/02/13, it says call in, check this out, I'm still getting request.

## 2013-08-28 NOTE — Telephone Encounter (Signed)
LAST OV 08/02/13. CALL IN [facility]. ROUTE TO POOL A TO CALL IN IF APPROVED

## 2013-08-29 ENCOUNTER — Encounter (INDEPENDENT_AMBULATORY_CARE_PROVIDER_SITE_OTHER): Payer: Medicare Other | Admitting: General Surgery

## 2013-09-10 ENCOUNTER — Encounter: Payer: Self-pay | Admitting: *Deleted

## 2013-09-17 ENCOUNTER — Encounter (INDEPENDENT_AMBULATORY_CARE_PROVIDER_SITE_OTHER): Payer: Self-pay | Admitting: General Surgery

## 2013-09-17 ENCOUNTER — Ambulatory Visit (INDEPENDENT_AMBULATORY_CARE_PROVIDER_SITE_OTHER): Payer: Medicare Other | Admitting: General Surgery

## 2013-09-17 VITALS — BP 130/70 | HR 84 | Temp 98.3°F | Resp 24 | Ht 64.0 in | Wt 126.0 lb

## 2013-09-17 DIAGNOSIS — K648 Other hemorrhoids: Secondary | ICD-10-CM

## 2013-09-17 MED ORDER — HYDROCORTISONE 2.5 % RE CREA: TOPICAL_CREAM | Freq: Two times a day (BID) | RECTAL | Status: DC

## 2013-09-17 NOTE — Progress Notes (Signed)
Chief Complaint  Patient presents with  . Rectal Pain    HISTORY: Jacqueline Cervantes is a 78 y.o. female who presents to the office with anorectal pain.  She has a chronic h/o abd pain and constipation.  Recently a CT revealed diverticulosis and some assymetric thickening of her proximal rectal wall.   A flex sig revealed no mucosal abnormalities.  Currently, her symptoms include lower abd pain and anal pain.  This had been occurring for several months.  She has tried nitroglycerin and diltiazem cream in the past with no success.  BM's makes the symptoms better.   It is continuous in nature.  Her bowel habits are regular and her bowel movements are soft with bid miralax.  Her fiber intake is dietary.  Her last colonoscopy was in 2013 and was essentially normal except for diverticula and hyperplastic polyps.       Past Medical History  Diagnosis Date  . Hypercholesteremia   . Diabetes mellitus   . History of breast cancer   . Arthritis   . Hypertension   . GERD (gastroesophageal reflux disease)   . Cancer     Breast      Past Surgical History  Procedure Laterality Date  . Abdominal hysterectomy    . Breast surgery  05/11/2006    mastectomy        Current Outpatient Prescriptions  Medication Sig Dispense Refill  . alendronate (FOSAMAX) 70 MG tablet TAKE 1 TABLET BY MOUTH ONCE A WEEK  4 tablet  3  . ALPRAZolam (XANAX) 0.5 MG tablet TAKE 1 TABLET BY MOUTH TWICE A DAY  60 tablet  0  . amLODipine (NORVASC) 10 MG tablet TAKE 1 TABLET (10 MG TOTAL) BY MOUTH DAILY.  30 tablet  3  . atorvastatin (LIPITOR) 40 MG tablet Take 1 tablet (40 mg total) by mouth daily.  30 tablet  11  . Calcium Carbonate-Vitamin D (CALCIUM 600+D) 600-400 MG-UNIT per tablet Take 1 tablet by mouth daily.        . Cholecalciferol (VITAMIN D3) 2000 UNITS TABS Take 1 capsule by mouth daily.        . citalopram (CELEXA) 20 MG tablet TAKE 1 TABLET EVERY DAY  30 tablet  11  . dicyclomine (BENTYL) 10 MG capsule Take 1 capsule  (10 mg total) by mouth 2 (two) times daily.  60 capsule  5  . docusate sodium (COLACE) 50 MG capsule Take by mouth 2 (two) times daily.        . fenofibrate 160 MG tablet TAKE 1 TABLET BY MOUTH AT BEDTIME  30 tablet  5  . hydrochlorothiazide (MICROZIDE) 12.5 MG capsule TAKE 1 CAPSULE BY MOUTH DAILY  30 capsule  3  . lisinopril (PRINIVIL,ZESTRIL) 40 MG tablet TAKE 1 TABLET (40 MG TOTAL) BY MOUTH DAILY.  30 tablet  2  . metFORMIN (GLUCOPHAGE) 500 MG tablet Take 2 tablets in AM and 1 tablet in PM  90 tablet  1  . Omega-3 Fatty Acids (FISH OIL) 1200 MG CAPS Take 1 capsule by mouth daily.        . Polyethylene Glycol 3350 (MIRALAX PO) Take by mouth 2 (two) times daily.      . Probiotic Product (ALIGN PO) Take 1 capsule by mouth daily.      . hydrocortisone (PROCTOZONE-HC) 2.5 % rectal cream Place rectally 2 (two) times daily.  30 g  0   No current facility-administered medications for this visit.      No Known  Allergies    FH: Colon cancer, immediate family  History   Social History  . Marital Status: Married    Spouse Name: N/A    Number of Children: N/A  . Years of Education: N/A   Social History Main Topics  . Smoking status: Current Every Day Smoker -- 0.50 packs/day  . Smokeless tobacco: None     Comment: pt quit 10/2012 with help of chantix  . Alcohol Use: No  . Drug Use: No  . Sexual Activity: None   Other Topics Concern  . None   Social History Narrative  . None      REVIEW OF SYSTEMS - PERTINENT POSITIVES ONLY: Review of Systems - General ROS: negative for - chills, fever or weight loss Hematological and Lymphatic ROS: negative for - bleeding problems, blood clots or bruising Respiratory ROS: no cough, shortness of breath, or wheezing Cardiovascular ROS: no chest pain or dyspnea on exertion Gastrointestinal ROS: positive for - abdominal pain, lower Neg for blood in stool Genito-Urinary ROS: no dysuria, trouble voiding, or hematuria  EXAM: Filed Vitals:    09/17/13 1030  BP: 130/70  Pulse: 84  Temp: 98.3 F (36.8 C)  Resp: 24    General appearance: alert and cooperative Resp: clear to auscultation bilaterally Cardio: regular rate and rhythm GI: normal findings: soft, non-tender   Procedure: Anoscopy Surgeon: Maisie Fus Diagnosis: anal pain  Assistant: Christella Scheuermann After the risks and benefits were explained, verbal consent was obtained for above procedure  Anesthesia: none Findings: evidence of previous fissure. Inflamed internal hemorrhoids, liquid stool in rectal vault    ASSESSMENT AND PLAN: Jacqueline Cervantes is a 78 y.o. F with chronic abd pain and constipation, who appears to have inflamed internal hemorrhoids.  I will start some anusol cream.  I have asked her to start a fiber supplement to bulk up her stools as well.  She will do sitz baths to help with the pain.  I will see her back in 2 months to check on her progress.      Vanita Panda, MD Colon and Rectal Surgery / General Surgery [facility]      Visit Diagnoses: 1. Hemorrhoids, internal     Primary Care Physician: Redmond Baseman, MD

## 2013-09-17 NOTE — Patient Instructions (Signed)
Use cream twice daily and use warm sitz baths 3 times a day for comfort.   GETTING TO GOOD BOWEL HEALTH. Irregular bowel habits such as constipation can lead to many problems over time.  Having one soft bowel movement a day is the most important way to prevent further problems.  The anorectal canal is designed to handle stretching and feces to safely manage our ability to get rid of solid waste (feces, poop, stool) out of our body.  BUT, hard constipated stools can act like ripping concrete bricks causing inflamed hemorrhoids, anal fissures, abdominal pain and bloating.     The goal: ONE SOFT BOWEL MOVEMENT A DAY!  To have soft, regular bowel movements:    Drink at least 8 tall glasses of water a day.     Take plenty of fiber.  Fiber is the undigested part of plant food that passes into the colon, acting s "natures broom" to encourage bowel motility and movement.  Fiber can absorb and hold large amounts of water. This results in a larger, bulkier stool, which is soft and easier to pass. Work gradually over several weeks up to 6 servings a day of fiber (25g a day even more if needed) in the form of: o Vegetables -- Root (potatoes, carrots, turnips), leafy green (lettuce, salad greens, celery, spinach), or cooked high residue (cabbage, broccoli, etc) o Fruit -- Fresh (unpeeled skin & pulp), Dried (prunes, apricots, cherries, etc ),  or stewed ( applesauce)  o Whole grain breads, pasta, etc (whole wheat)  o Bran cereals    Bulking Agents -- This type of water-retaining fiber generally is easily obtained each day by one of the following:  o Psyllium bran -- The psyllium plant is remarkable because its ground seeds can retain so much water. This product is available as Metamucil, Konsyl, Effersyllium, Per Diem Fiber, or the less expensive generic preparation in drug and health food stores. Although labeled a laxative, it really is not a laxative.  o Methylcellulose -- This is another fiber derived from wood  which also retains water. It is available as Citrucel. o Polyethylene Glycol - and "artificial" fiber commonly called Miralax or Glycolax.  It is helpful for people with gassy or bloated feelings with regular fiber o Flax Seed - a less gassy fiber than psyllium   No reading or other relaxing activity while on the toilet. If bowel movements take longer than 5 minutes, you are too constipated   AVOID CONSTIPATION.  High fiber and water intake usually takes care of this.  Sometimes a laxative is needed to stimulate more frequent bowel movements, but    Laxatives are not a good long-term solution as it can wear the colon out. o Osmotics (Milk of Magnesia, Fleets phosphosoda, Magnesium citrate, MiraLax, GoLytely) are safer than  o Stimulants (Senokot, Castor Oil, Dulcolax, Ex Lax)    o Do not take laxatives for more than 7days in a row.    IF SEVERELY CONSTIPATED, try a Bowel Retraining Program: o Do not use laxatives.  o Eat a diet high in roughage, such as bran cereals and leafy vegetables.  o Drink six (6) ounces of prune or apricot juice each morning.  o Eat two (2) large servings of stewed fruit each day.  o Take one (1) heaping tablespoon of a psyllium-based bulking agent twice a day. Use sugar-free sweetener when possible to avoid excessive calories.  o Eat a normal breakfast.  o Set aside 15 minutes after breakfast to sit  on the toilet, but do not strain to have a bowel movement.  o If you do not have a bowel movement by the third day, use an enema and repeat the above steps.    HEMORRHOIDS    Did you know... Hemorrhoids are one of the most common ailments known.  More than half the population will develop hemorrhoids, usually after age 25.  Millions of Americans currently suffer from hemorrhoids.  The average person suffers in silence for a long period before seeking medical care.  Today's treatment methods make some types of hemorrhoid removal much less painful.  What are  hemorrhoids? Often described as "varicose veins of the anus and rectum", hemorrhoids are enlarged, bulging blood vessels in and about the anus and lower rectum. There are two types of hemorrhoids: external and internal, which refer to their location.  External (outside) hemorrhoids develop near the anus and are covered by very sensitive skin. These are usually painless. However, if a blood clot (thrombosis) develops in an external hemorrhoid, it becomes a painful, hard lump. The external hemorrhoid may bleed if it ruptures. Internal (inside) hemorrhoids develop within the anus beneath the lining. Painless bleeding and protrusion during bowel movements are the most common symptom. However, an internal hemorrhoid can cause severe pain if it is completely "prolapsed" - protrudes from the anal opening and cannot be pushed back inside.   What causes hemorrhoids? An exact cause is unknown; however, the upright posture of humans alone forces a great deal of pressure on the rectal veins, which sometimes causes them to bulge. Other contributing factors include:  . Aging  . Chronic constipation or diarrhea  . Pregnancy  . Heredity  . Straining during bowel movements  . Faulty bowel function due to overuse of laxatives or enemas . Spending long periods of time (e.g., reading) on the toilet  Whatever the cause, the tissues supporting the vessels stretch. As a result, the vessels dilate; their walls become thin and bleed. If the stretching and pressure continue, the weakened vessels protrude.  What are the symptoms? If you notice any of the following, you could have hemorrhoids:  . Bleeding during bowel movements  . Protrusion during bowel movements . Itching in the anal area  . Pain  . Sensitive lump(s)  How are hemorrhoids treated? Mild symptoms can be relieved frequently by increasing the amount of fiber (e.g., fruits, vegetables, breads and cereals) and fluids in the diet. Eliminating excessive  straining reduces the pressure on hemorrhoids and helps prevent them from protruding. A sitz bath - sitting in plain warm water for about 10 minutes - can also provide some relief . With these measures, the pain and swelling of most symptomatic hemorrhoids will decrease in two to seven days, and the firm lump should recede within four to six weeks. In cases of severe or persistent pain from a thrombosed hemorrhoid, your physician may elect to remove the hemorrhoid containing the clot with a small incision. Performed under local anesthesia as an outpatient, this procedure generally provides relief. Severe hemorrhoids may require special treatment, much of which can be performed on an outpatient basis.  . Ligation - the rubber band treatment - works effectively on internal hemorrhoids that protrude with bowel movements. A small rubber band is placed over the hemorrhoid, cutting off its blood supply. The hemorrhoid and the band fall off in a few days and the wound usually heals in a week or two. This procedure sometimes produces mild discomfort and bleeding and may  need to be repeated for a full effect.  There is a more intense version of this procedure that is done in the OR as outpatient surgery called THD.  It involves identifying blood vessels leading to the hemorrhoids and then tying them off with sutures.  This method is a little more painful than rubber band ligation but less painful than traditional hemorrhoidectomy and usually does not have to be repeated.  It is best for internal hemorrhoids that bleed.  Rubber Band Ligation of Internal Hemorrhoids:  A.  Bulging, bleeding, internal hemorrhoid B.  Rubber band applied at the base of the hemorrhoid C.  About 7 days later, the banded hemorrhoid has fallen off leaving a small scar (arrow)  . Injection and Coagulation can also be used on bleeding hemorrhoids that do not protrude. Both methods are relatively painless and cause the hemorrhoid to shrivel  up. Marland Kitchen Hemorrhoidectomy - surgery to remove the hemorrhoids - is the most complete method for removal of internal and external hemorrhoids. It is necessary when (1) clots repeatedly form in external hemorrhoids; (2) ligation fails to treat internal hemorrhoids; (3) the protruding hemorrhoid cannot be reduced; or (4) there is persistent bleeding. A hemorrhoidectomy removes excessive tissue that causes the bleeding and protrusion. It is done under anesthesia using sutures, and may, depending upon circumstances, require hospitalization and a period of inactivity. Laser hemorrhoidectomies do not offer any advantage over standard operative techniques. They are also quite expensive, and contrary to popular belief, are no less painful.  Do hemorrhoids lead to cancer? No. There is no relationship between hemorrhoids and cancer. However, the symptoms of hemorrhoids, particularly bleeding, are similar to those of colorectal cancer and other diseases of the digestive system. Therefore, it is important that all symptoms are investigated by a physician specially trained in treating diseases of the colon and rectum and that everyone 50 years or older undergo screening tests for colorectal cancer. Do not rely on over-the-counter medications or other self-treatments. See a colorectal surgeon first so your symptoms can be properly evaluated and effective treatment prescribed.  2012 [employer] Society of Colon [employer] Rectal Sur[employer]

## 2013-09-30 ENCOUNTER — Other Ambulatory Visit: Payer: Self-pay | Admitting: Family Medicine

## 2013-10-04 ENCOUNTER — Encounter (INDEPENDENT_AMBULATORY_CARE_PROVIDER_SITE_OTHER): Payer: Self-pay

## 2013-10-17 ENCOUNTER — Other Ambulatory Visit: Payer: Self-pay | Admitting: Family Medicine

## 2013-10-21 ENCOUNTER — Encounter (INDEPENDENT_AMBULATORY_CARE_PROVIDER_SITE_OTHER): Payer: Self-pay

## 2013-10-21 ENCOUNTER — Other Ambulatory Visit: Payer: Self-pay | Admitting: Family Medicine

## 2013-10-22 NOTE — Telephone Encounter (Signed)
Last filled 08/23/13, last seen 08/02/13. Route to pool A, call into [facility]

## 2013-10-25 NOTE — Telephone Encounter (Signed)
[facility] notified of [facility]ill

## 2013-10-25 NOTE — Telephone Encounter (Signed)
Rx ready for nurse to Phone in. 

## 2013-10-31 ENCOUNTER — Other Ambulatory Visit: Payer: Self-pay | Admitting: Family Medicine

## 2013-11-01 ENCOUNTER — Ambulatory Visit (INDEPENDENT_AMBULATORY_CARE_PROVIDER_SITE_OTHER): Payer: Medicare Other | Admitting: Family Medicine

## 2013-11-01 ENCOUNTER — Encounter: Payer: Self-pay | Admitting: Family Medicine

## 2013-11-01 VITALS — BP 126/70 | HR 83 | Temp 98.2°F | Ht 64.0 in | Wt 126.4 lb

## 2013-11-01 DIAGNOSIS — C50912 Malignant neoplasm of unspecified site of left female breast: Secondary | ICD-10-CM

## 2013-11-01 DIAGNOSIS — C50919 Malignant neoplasm of unspecified site of unspecified female breast: Secondary | ICD-10-CM

## 2013-11-01 DIAGNOSIS — E785 Hyperlipidemia, unspecified: Secondary | ICD-10-CM

## 2013-11-01 DIAGNOSIS — Z23 Encounter for immunization: Secondary | ICD-10-CM

## 2013-11-01 DIAGNOSIS — I10 Essential (primary) hypertension: Secondary | ICD-10-CM

## 2013-11-01 DIAGNOSIS — M810 Age-related osteoporosis without current pathological fracture: Secondary | ICD-10-CM

## 2013-11-01 DIAGNOSIS — E119 Type 2 diabetes mellitus without complications: Secondary | ICD-10-CM

## 2013-11-01 DIAGNOSIS — F17200 Nicotine dependence, unspecified, uncomplicated: Secondary | ICD-10-CM

## 2013-11-01 DIAGNOSIS — Z720 Tobacco use: Secondary | ICD-10-CM

## 2013-11-01 LAB — POCT GLYCOSYLATED HEMOGLOBIN (HGB A1C): Hemoglobin A1C: 5.3

## 2013-11-01 NOTE — Progress Notes (Addendum)
Patient ID: Jacqueline Cervantes, female   DOB: [birth year], 78 y.o.   MRN: [id number] SUBJECTIVE: CC: Chief Complaint  Patient presents with  . Follow-up    3 m onth follow up     HPI:   Patient is here for follow up of Diabetes Mellitus/hypercholesterolemis/tobacco smoing , she had restarted a few cigarettes.: Symptoms evaluated: Denies Nocturia ,Denies Urinary Frequency , denies Blurred vision ,deniesDizziness,denies.Dysuria,denies paresthesias, denies extremity pain or ulcers.Marland Kitchendenies chest pain. has had an annual eye exam. do check the feet. Does check CBGs. Average HYQ:MVHQ not check regularly. Denies episodes of hypoglycemia. Does have an emergency hypoglycemic plan. admits toCompliance with medications. Denies Problems with medications.  Past Medical History  Diagnosis Date  . Hypercholesteremia   . Diabetes mellitus   . History of breast cancer   . Arthritis   . Hypertension   . GERD (gastroesophageal reflux disease)   . Cancer     Breast   Past Surgical History  Procedure Laterality Date  . Abdominal hysterectomy    . Breast surgery  05/11/2006    mastectomy   History   Social History  . Marital Status: Married    Spouse Name: N/A    Number of Children: N/A  . Years of Education: N/A   Occupational History  . Not on file.   Social History Main Topics  . Smoking status: Current Every Day Smoker -- 0.50 packs/day  . Smokeless tobacco: Not on file     Comment: pt quit 10/2012 with help of chantix  . Alcohol Use: No  . Drug Use: No  . Sexual Activity: Not on file   Other Topics Concern  . Not on file   Social History Narrative  . No narrative on file   No family history on file. Current Outpatient Prescriptions on File Prior to Visit  Medication Sig Dispense Refill  . alendronate (FOSAMAX) 70 MG tablet TAKE 1 TABLET BY MOUTH ONCE A WEEK  4 tablet  3  . ALPRAZolam (XANAX) 0.5 MG tablet TAKE 1 TABLET BY MOUTH TWICE DAILY  60 tablet  0  . amLODipine  (NORVASC) 10 MG tablet TAKE 1 TABLET (10 MG TOTAL) BY MOUTH DAILY.  30 tablet  5  . atorvastatin (LIPITOR) 40 MG tablet Take 1 tablet (40 mg total) by mouth daily.  30 tablet  11  . Calcium Carbonate-Vitamin D (CALCIUM 600+D) 600-400 MG-UNIT per tablet Take 1 tablet by mouth daily.        . Cholecalciferol (VITAMIN D3) 2000 UNITS TABS Take 1 capsule by mouth daily.        . citalopram (CELEXA) 20 MG tablet TAKE 1 TABLET EVERY DAY  30 tablet  11  . dicyclomine (BENTYL) 10 MG capsule Take 1 capsule (10 mg total) by mouth 2 (two) times daily.  60 capsule  5  . docusate sodium (COLACE) 50 MG capsule Take by mouth 2 (two) times daily.        . fenofibrate 160 MG tablet TAKE 1 TABLET BY MOUTH AT BEDTIME  30 tablet  5  . hydrochlorothiazide (MICROZIDE) 12.5 MG capsule TAKE 1 CAPSULE BY MOUTH DAILY  30 capsule  3  . hydrocortisone (PROCTOZONE-HC) 2.5 % rectal cream Place rectally 2 (two) times daily.  30 g  0  . lisinopril (PRINIVIL,ZESTRIL) 40 MG tablet TAKE 1 TABLET (40 MG TOTAL) BY MOUTH DAILY.  30 tablet  4  . metFORMIN (GLUCOPHAGE) 500 MG tablet TAKE 2 TABLETS BY MOUTH IN THE AM, AND I  TABLET BY MOUTH IN THE EVENING  90 tablet  1  . Omega-3 Fatty Acids (FISH OIL) 1200 MG CAPS Take 1 capsule by mouth daily.        . Polyethylene Glycol 3350 (MIRALAX PO) Take by mouth 2 (two) times daily.      . Probiotic Product (ALIGN PO) Take 1 capsule by mouth daily.       No current facility-administered medications on file prior to visit.   No Known Allergies Immunization History  Administered Date(s) Administered  . Influenza,inj,Quad PF,36+ Mos 11/01/2013   Prior to Admission medications   Medication Sig Start Date End Date Taking? Authorizing Provider  alendronate (FOSAMAX) 70 MG tablet TAKE 1 TABLET BY MOUTH ONCE A WEEK 08/17/13   Ileana Ladd, MD  ALPRAZolam Prudy Feeler) 0.5 MG tablet TAKE 1 TABLET BY MOUTH TWICE DAILY 10/21/13   Ileana Ladd, MD  amLODipine (NORVASC) 10 MG tablet TAKE 1 TABLET (10 MG  TOTAL) BY MOUTH DAILY. 07/13/13   Ileana Ladd, MD  atorvastatin (LIPITOR) 40 MG tablet Take 1 tablet (40 mg total) by mouth daily. 08/02/13   Ileana Ladd, MD  Calcium Carbonate-Vitamin D (CALCIUM 600+D) 600-400 MG-UNIT per tablet Take 1 tablet by mouth daily.      Historical Provider, MD  Cholecalciferol (VITAMIN D3) 2000 UNITS TABS Take 1 capsule by mouth daily.      Historical Provider, MD  citalopram (CELEXA) 20 MG tablet TAKE 1 TABLET EVERY DAY 08/02/13   Ileana Ladd, MD  dicyclomine (BENTYL) 10 MG capsule Take 1 capsule (10 mg total) by mouth 2 (two) times daily. 08/02/13   Ileana Ladd, MD  docusate sodium (COLACE) 50 MG capsule Take by mouth 2 (two) times daily.      Historical Provider, MD  fenofibrate 160 MG tablet TAKE 1 TABLET BY MOUTH AT BEDTIME 05/13/13   Ernestina Penna, MD  hydrochlorothiazide (MICROZIDE) 12.5 MG capsule TAKE 1 CAPSULE BY MOUTH DAILY 10/17/13   Ileana Ladd, MD  hydrocortisone (PROCTOZONE-HC) 2.5 % rectal cream Place rectally 2 (two) times daily. 09/17/13   Romie Levee, MD  lisinopril (PRINIVIL,ZESTRIL) 40 MG tablet TAKE 1 TABLET (40 MG TOTAL) BY MOUTH DAILY. 09/30/13   Coralie Keens, FNP  metFORMIN (GLUCOPHAGE) 500 MG tablet TAKE 2 TABLETS BY MOUTH IN THE AM, AND I TABLET BY MOUTH IN THE EVENING 09/30/13   Manual Meier Evan, FNP  Omega-3 Fatty Acids (FISH OIL) 1200 MG CAPS Take 1 capsule by mouth daily.      Historical Provider, MD  Polyethylene Glycol 3350 (MIRALAX PO) Take by mouth 2 (two) times daily.    Historical Provider, MD  Probiotic Product (ALIGN PO) Take 1 capsule by mouth daily.    Historical Provider, MD     ROS: As above in the HPI. All other systems are stable or negative.  OBJECTIVE: APPEARANCE:  Patient in no acute distress.The patient appeared well nourished and normally developed. Acyanotic. Waist: VITAL SIGNS:BP 126/70  Pulse 83  Temp(Src) 98.2 F (36.8 C) (Oral)  Ht 5\' 4"  (1.626 m)  Wt 126 lb 6.4 oz (57.335 kg)  BMI 21.69  kg/m2 WF   HEAD and Neck: without JVD, Head and scalp: normal Eyes:No scleral icterus. Fundi normal, eye movements normal. Ears: Auricle normal, canal normal, Tympanic membranes normal, insufflation normal. Nose: normal Throat: normal Neck & thyroid: normal  CHEST & LUNGS: Chest wall: normal Lungs: Coarse BS, no rales no wheezes  [facility]: Reveals the PMI to  be normally located. Regular rhythm, First and Second Heart sounds are normal,  absence of murmurs, rubs or gallops. Peripheral vasculature: Radial pulses: normal Dorsal pedis pulses: normal Posterior pulses: normal  ABDOMEN:  Appearance: normal Benign, no organomegaly, no masses, no Abdominal Aortic enlargement. No Guarding , no rebound. No Bruits. Bowel sounds: normal  RECTAL: N/A GU: N/A  EXTREMETIES: nonedematous.   NEUROLOGIC: oriented to time,place and person; nonfocal. Results for orders placed in visit on 11/01/13  POCT GLYCOSYLATED HEMOGLOBIN (HGB A1C)      Result Value Range   Hemoglobin A1C 5.3%      ASSESSMENT: DM (diabetes mellitus) - Plan: POCT glycosylated hemoglobin (Hb A1C), CMP14+EGFR  HLD (hyperlipidemia) - Plan: Lipid panel  Tobacco user  Breast cancer, left breast  HTN (hypertension)  Osteoporosis, unspecified  Hypercalcemia - Plan: Parathyroid hormone, intact (no Ca), Calcium, ionized  Need for prophylactic vaccination and inoculation against influenza  PLAN:  Orders Placed This Encounter  Procedures  . CMP14+EGFR  . Lipid panel  . Parathyroid hormone, intact (no Ca)  . Calcium, ionized  . POCT glycosylated hemoglobin (Hb A1C)  smoking cessation counseling and handout in the [facility]. Wellness discussed.  No orders of the defined types were placed in this encounter.   There are no discontinued medications. Return in about 3 months (around 02/01/2014) for Recheck medical problems.  Colbie Sliker P. [place] Charon, M.D.

## 2013-11-01 NOTE — Patient Instructions (Signed)
Smoking Cessation Quitting smoking is important to your health and has many advantages. However, it is not always easy to quit since nicotine is a very addictive drug. Often times, people try 3 times or more before being able to quit. This document explains the best ways for you to prepare to quit smoking. Quitting takes hard work and a lot of effort, but you can do it. ADVANTAGES OF QUITTING SMOKING  You will live longer, feel better, and live better.  Your body will feel the impact of quitting smoking almost immediately.  Within 20 minutes, blood pressure decreases. Your pulse returns to its normal level.  After 8 hours, carbon monoxide levels in the blood return to normal. Your oxygen level increases.  After 24 hours, the chance of having a heart attack starts to decrease. Your breath, hair, and body stop smelling like smoke.  After 48 hours, damaged nerve endings begin to recover. Your sense of taste and smell improve.  After 72 hours, the body is virtually free of nicotine. Your bronchial tubes relax and breathing becomes easier.  After 2 to 12 weeks, lungs can hold more air. Exercise becomes easier and circulation improves.  The risk of having a heart attack, stroke, cancer, or lung disease is greatly reduced.  After 1 year, the risk of coronary heart disease is cut in half.  After 5 years, the risk of stroke falls to the same as a nonsmoker.  After 10 years, the risk of lung cancer is cut in half and the risk of other cancers decreases significantly.  After 15 years, the risk of coronary heart disease drops, usually to the level of a nonsmoker.  If you are pregnant, quitting smoking will improve your chances of having a healthy baby.  The people you live with, especially any children, will be healthier.  You will have extra money to spend on things other than cigarettes. QUESTIONS TO THINK ABOUT BEFORE ATTEMPTING TO QUIT You may want to talk about your answers with your  caregiver.  Why do you want to quit?  If you tried to quit in the past, what helped and what did not?  What will be the most difficult situations for you after you quit? How will you plan to handle them?  Who can help you through the tough times? Your family? Friends? A caregiver?  What pleasures do you get from smoking? What ways can you still get pleasure if you quit? Here are some questions to ask your caregiver:  How can you help me to be successful at quitting?  What medicine do you think would be best for me and how should I take it?  What should I do if I need more help?  What is smoking withdrawal like? How can I get information on withdrawal? GET READY  Set a quit date.  Change your environment by getting rid of all cigarettes, ashtrays, matches, and lighters in your home, car, or work. Do not let people smoke in your home.  Review your past attempts to quit. Think about what worked and what did not. GET SUPPORT AND ENCOURAGEMENT You have a better chance of being successful if you have help. You can get support in many ways.  Tell your family, friends, and co-workers that you are going to quit and need their support. Ask them not to smoke around you.  Get individual, group, or telephone counseling and support. Programs are available at local hospitals and health centers. Call your local health department for   information about programs in your area.  Spiritual beliefs and practices may help some smokers quit.  Download a "quit meter" on your computer to keep track of quit statistics, such as how long you have gone without smoking, cigarettes not smoked, and money saved.  Get a self-help book about quitting smoking and staying off of tobacco. LEARN NEW SKILLS AND BEHAVIORS  Distract yourself from urges to smoke. Talk to someone, go for a walk, or occupy your time with a task.  Change your normal routine. Take a different route to work. Drink tea instead of coffee.  Eat breakfast in a different place.  Reduce your stress. Take a hot bath, exercise, or read a book.  Plan something enjoyable to do every day. Reward yourself for not smoking.  Explore interactive web-based programs that specialize in helping you quit. GET MEDICINE AND USE IT CORRECTLY Medicines can help you stop smoking and decrease the urge to smoke. Combining medicine with the above behavioral methods and support can greatly increase your chances of successfully quitting smoking.  Nicotine replacement therapy helps deliver nicotine to your body without the negative effects and risks of smoking. Nicotine replacement therapy includes nicotine gum, lozenges, inhalers, nasal sprays, and skin patches. Some may be available over-the-counter and others require a prescription.  Antidepressant medicine helps people abstain from smoking, but how this works is unknown. This medicine is available by prescription.  Nicotinic receptor partial agonist medicine simulates the effect of nicotine in your brain. This medicine is available by prescription. Ask your caregiver for advice about which medicines to use and how to use them based on your health history. Your caregiver will tell you what side effects to look out for if you choose to be on a medicine or therapy. Carefully read the information on the package. Do not use any other product containing nicotine while using a nicotine replacement product.  RELAPSE OR DIFFICULT SITUATIONS Most relapses occur within the first 3 months after quitting. Do not be discouraged if you start smoking again. Remember, most people try several times before finally quitting. You may have symptoms of withdrawal because your body is used to nicotine. You may crave cigarettes, be irritable, feel very hungry, cough often, get headaches, or have difficulty concentrating. The withdrawal symptoms are only temporary. They are strongest when you first quit, but they will go away within  10 14 days. To reduce the chances of relapse, try to:  Avoid drinking alcohol. Drinking lowers your chances of successfully quitting.  Reduce the amount of caffeine you consume. Once you quit smoking, the amount of caffeine in your body increases and can give you symptoms, such as a rapid heartbeat, sweating, and anxiety.  Avoid smokers because they can make you want to smoke.  Do not let weight gain distract you. Many smokers will gain weight when they quit, usually less than 10 pounds. Eat a healthy diet and stay active. You can always lose the weight gained after you quit.  Find ways to improve your mood other than smoking. FOR MORE INFORMATION  www.smokefree.gov  Document Released: 12/06/2001 Document Revised: 06/12/2012 Document Reviewed: 03/22/2012 [employer] Patient Information 2014 [employer].  

## 2013-11-02 LAB — LIPID PANEL
Chol/HDL Ratio: 2.6 ratio units (ref 0.0–4.4)
Cholesterol, Total: 140 mg/dL (ref 100–199)
HDL: 53 mg/dL (ref >39)
LDL Calculated: 61 mg/dL (ref 0–99)
Triglycerides: 131 mg/dL (ref 0–149)
VLDL Cholesterol Cal: 26 mg/dL (ref 5–40)

## 2013-11-02 LAB — CMP14+EGFR
ALT: 10 IU/L (ref 0–32)
AST: 12 IU/L (ref 0–40)
Albumin/Globulin Ratio: 2 (ref 1.1–2.5)
Albumin: 4.7 g/dL (ref 3.5–4.8)
Alkaline Phosphatase: 21 IU/L — ABNORMAL LOW (ref 39–117)
BUN/Creatinine Ratio: 14 (ref 11–26)
BUN: 10 mg/dL (ref 8–27)
CO2: 28 mmol/L (ref 18–29)
Calcium: 10.6 mg/dL — ABNORMAL HIGH (ref 8.6–10.2)
Chloride: 93 mmol/L — ABNORMAL LOW (ref 97–108)
Creatinine, Ser: 0.7 mg/dL (ref 0.57–1.00)
GFR calc Af Amer: 97 mL/min/{1.73_m2} (ref >59)
GFR calc non Af Amer: 84 mL/min/{1.73_m2} (ref >59)
Globulin, Total: 2.4 g/dL (ref 1.5–4.5)
Glucose: 82 mg/dL (ref 65–99)
Potassium: 5.2 mmol/L (ref 3.5–5.2)
Sodium: 138 mmol/L (ref 134–144)
Total Bilirubin: 0.2 mg/dL (ref 0.0–1.2)
Total Protein: 7.1 g/dL (ref 6.0–8.5)

## 2013-11-02 LAB — PARATHYROID HORMONE, INTACT (NO CA): PTH: 10 pg/mL — ABNORMAL LOW (ref 15–65)

## 2013-11-02 LAB — SPECIMEN STATUS REPORT

## 2013-11-02 LAB — CALCIUM, IONIZED: Calcium, Ion: 5.5 mg/dL (ref 4.5–5.6)

## 2013-11-04 ENCOUNTER — Other Ambulatory Visit: Payer: Self-pay

## 2013-11-04 MED ORDER — METFORMIN HCL 500 MG PO TABS: ORAL_TABLET | ORAL | Status: DC

## 2013-11-08 ENCOUNTER — Ambulatory Visit: Payer: Medicare Other | Admitting: Family Medicine

## 2013-11-14 ENCOUNTER — Other Ambulatory Visit: Payer: Self-pay

## 2013-11-14 DIAGNOSIS — Z9012 Acquired absence of left breast and nipple: Secondary | ICD-10-CM

## 2013-11-14 DIAGNOSIS — Z853 Personal history of malignant neoplasm of breast: Secondary | ICD-10-CM

## 2013-11-14 DIAGNOSIS — Z1231 Encounter for screening mammogram for malignant neoplasm of breast: Secondary | ICD-10-CM

## 2013-11-19 ENCOUNTER — Encounter (INDEPENDENT_AMBULATORY_CARE_PROVIDER_SITE_OTHER): Payer: Medicare Other | Admitting: General Surgery

## 2013-11-24 ENCOUNTER — Other Ambulatory Visit: Payer: Self-pay | Admitting: Family Medicine

## 2013-11-26 NOTE — Telephone Encounter (Signed)
[facility] --[facility]

## 2013-11-26 NOTE — Telephone Encounter (Signed)
Rx ready for nurse to Phone in. 

## 2013-11-26 NOTE — Telephone Encounter (Signed)
Last seen 11/01/13  FPW  If approved route to nurse [facility] 

## 2013-11-26 NOTE — Telephone Encounter (Signed)
[facility]

## 2013-12-10 ENCOUNTER — Encounter (INDEPENDENT_AMBULATORY_CARE_PROVIDER_SITE_OTHER): Payer: Medicare Other | Admitting: General Surgery

## 2013-12-20 ENCOUNTER — Ambulatory Visit
Admission: RE | Admit: 2013-12-20 | Discharge: 2013-12-20 | Disposition: A | Discharge status: Home / Self Care | Payer: Medicare Other | Source: Ambulatory Visit

## 2013-12-20 DIAGNOSIS — Z1231 Encounter for screening mammogram for malignant neoplasm of breast: Secondary | ICD-10-CM

## 2013-12-20 DIAGNOSIS — Z853 Personal history of malignant neoplasm of breast: Secondary | ICD-10-CM

## 2013-12-20 DIAGNOSIS — Z9012 Acquired absence of left breast and nipple: Secondary | ICD-10-CM

## 2013-12-24 ENCOUNTER — Other Ambulatory Visit: Payer: Self-pay | Admitting: Family Medicine

## 2013-12-27 NOTE — Telephone Encounter (Signed)
Last seen 11/03/13  FPW  If approved route to nurse to [facility]

## 2013-12-27 NOTE — Telephone Encounter (Signed)
Rx ready for nurse to Phone in. 

## 2013-12-27 NOTE — Telephone Encounter (Signed)
[facility]am

## 2014-01-27 ENCOUNTER — Other Ambulatory Visit: Payer: Self-pay | Admitting: Family Medicine

## 2014-01-28 NOTE — Telephone Encounter (Signed)
Last seen 11/01/13  FPW  If approved route to  Nurse to call [facility]

## 2014-01-30 NOTE — Telephone Encounter (Signed)
[facility] NOT[facility]IED [facility]FI[facility]

## 2014-01-30 NOTE — Telephone Encounter (Signed)
Rx ready for nurse to Phone in. 

## 2014-02-05 ENCOUNTER — Other Ambulatory Visit: Payer: Self-pay | Admitting: Family Medicine

## 2014-02-19 ENCOUNTER — Other Ambulatory Visit: Payer: Self-pay | Admitting: *Deleted

## 2014-02-19 MED ORDER — LISINOPRIL 40 MG PO TABS: 40.0000 mg | ORAL_TABLET | Freq: Every day | ORAL | Status: DC

## 2014-02-28 ENCOUNTER — Other Ambulatory Visit: Payer: Self-pay | Admitting: *Deleted

## 2014-02-28 DIAGNOSIS — C50912 Malignant neoplasm of unspecified site of left female breast: Secondary | ICD-10-CM

## 2014-03-03 ENCOUNTER — Other Ambulatory Visit (HOSPITAL_BASED_OUTPATIENT_CLINIC_OR_DEPARTMENT_OTHER): Payer: Medicare Other

## 2014-03-03 ENCOUNTER — Other Ambulatory Visit: Payer: Self-pay | Admitting: Family Medicine

## 2014-03-03 ENCOUNTER — Encounter: Payer: Self-pay | Admitting: Oncology

## 2014-03-03 ENCOUNTER — Telehealth: Payer: Self-pay | Admitting: Oncology

## 2014-03-03 ENCOUNTER — Ambulatory Visit (HOSPITAL_BASED_OUTPATIENT_CLINIC_OR_DEPARTMENT_OTHER): Payer: Medicare Other | Admitting: Oncology

## 2014-03-03 VITALS — BP 136/68 | HR 88 | Temp 98.0°F | Resp 18 | Ht 64.0 in | Wt 129.3 lb

## 2014-03-03 DIAGNOSIS — M899 Disorder of bone, unspecified: Secondary | ICD-10-CM

## 2014-03-03 DIAGNOSIS — C50912 Malignant neoplasm of unspecified site of left female breast: Secondary | ICD-10-CM

## 2014-03-03 DIAGNOSIS — Z720 Tobacco use: Secondary | ICD-10-CM

## 2014-03-03 DIAGNOSIS — Z853 Personal history of malignant neoplasm of breast: Secondary | ICD-10-CM

## 2014-03-03 DIAGNOSIS — M949 Disorder of cartilage, unspecified: Secondary | ICD-10-CM

## 2014-03-03 DIAGNOSIS — F17200 Nicotine dependence, unspecified, uncomplicated: Secondary | ICD-10-CM

## 2014-03-03 LAB — CBC WITH DIFFERENTIAL/PLATELET
BASO%: 0.3 % (ref 0.0–2.0)
Basophils Absolute: 0 10*3/uL (ref 0.0–0.1)
EOS ABS: 0.1 10*3/uL (ref 0.0–0.5)
EOS%: 0.5 % (ref 0.0–7.0)
HCT: 40.9 % (ref 34.8–46.6)
HGB: 13.6 g/dL (ref 11.6–15.9)
LYMPH%: 22.6 % (ref 14.0–49.7)
MCH: 27.6 pg (ref 25.1–34.0)
MCHC: 33.3 g/dL (ref 31.5–36.0)
MCV: 83.1 fL (ref 79.5–101.0)
MONO#: 0.7 10*3/uL (ref 0.1–0.9)
MONO%: 7 % (ref 0.0–14.0)
NEUT%: 69.6 % (ref 38.4–76.8)
NEUTROS ABS: 6.8 10*3/uL — AB (ref 1.5–6.5)
Platelets: 278 10*3/uL (ref 145–400)
RBC: 4.92 10*6/uL (ref 3.70–5.45)
RDW: 16.6 % — AB (ref 11.2–14.5)
WBC: 9.8 10*3/uL (ref 3.9–10.3)
lymph#: 2.2 10*3/uL (ref 0.9–3.3)

## 2014-03-03 LAB — COMPREHENSIVE METABOLIC PANEL (CC13)
ALBUMIN: 4 g/dL (ref 3.5–5.0)
ALK PHOS: 23 U/L — AB (ref 40–150)
ALT: 14 U/L (ref 0–55)
AST: 14 U/L (ref 5–34)
Anion Gap: 11 mEq/L (ref 3–11)
BUN: 14.5 mg/dL (ref 7.0–26.0)
CO2: 33 mEq/L — ABNORMAL HIGH (ref 22–29)
Calcium: 10.5 mg/dL — ABNORMAL HIGH (ref 8.4–10.4)
Chloride: 97 mEq/L — ABNORMAL LOW (ref 98–109)
Creatinine: 0.8 mg/dL (ref 0.6–1.1)
Glucose: 127 mg/dl (ref 70–140)
POTASSIUM: 4.4 meq/L (ref 3.5–5.1)
SODIUM: 141 meq/L (ref 136–145)
TOTAL PROTEIN: 7.7 g/dL (ref 6.4–8.3)
Total Bilirubin: 0.4 mg/dL (ref 0.20–1.20)

## 2014-03-03 NOTE — Progress Notes (Signed)
OFFICE PROGRESS NOTE  CC**  [name], MD [place] [address]  DIAGNOSIS: 79 y/o female with h/o stage IA ER positive, PR positive, HER-2/neu negative invasive lobular carcinoma of the left breast diagnosed in 2007.  PRIOR THERAPY:  1. Underwent screening mammogram in April 2007 where an abnormality was noted.  Biopsy showed invasive mammary carcinoma of the left breast.  MRI detected two masses approximately 1.4 cm in size.    2. Patient underwent left mastectomy by Dr. Margot Chimes on 05/11/06.  Pathology showed invasive lobular carcinoma 1.2 cm and 1.5 cm.  1/3 lymph nodes positive for isolated tumor cells.  The first tumor was ER 77%, PR 68%, HER-2/neu indeterminate, Ki-67 7%.  The second tumor was ER 94%, PR 88%, HER-2/neu neg, Ki-67 15%.    3. She was begun on Arimidex in May 2007 and tolerated it well until she completed 5 years of therapy in May 2013.  She has underwent annual diagnostic mammograms since and no abnormality has been noted.   CURRENT THERAPY: Observation  INTERVAL HISTORY: Jacqueline Cervantes 79 y.o. female returns for annual follow up for her history of Stage IA breast cancer.  She continues to smoke approximately 1/4 pack of cigarettes per day, and is cutting back.  She is  interested in quitting at this time.  She does have osteopenia and takes Fosamax for this as prescribed by her PCP.  She hasn't noted any changes in her breasts, or swelling in either arms.  Today she denies any headaches double vision blurring of vision fevers chills night sweats. No shortness of breath chest pains palpitations. No abdominal pain no diarrhea or constipation. She has no easy bruising or bleeding. She has no myalgias and arthralgias. No peripheral paresthesias or gait disturbances. Remainder of the 10 point review of systems is negative. A 10 point ROS is negative.    MEDICAL HISTORY: Past Medical History  Diagnosis Date  . Hypercholesteremia   . Diabetes  mellitus   . History of breast cancer   . Arthritis   . Hypertension   . GERD (gastroesophageal reflux disease)   . Cancer     Breast    ALLERGIES:  has No Known Allergies.  MEDICATIONS:  Current Outpatient Prescriptions  Medication Sig Dispense Refill  . alendronate (FOSAMAX) 70 MG tablet TAKE 1 TABLET BY MOUTH ONCE A WEEK  4 tablet  4  . ALPRAZolam (XANAX) 0.5 MG tablet TAKE AS DIRECTED  60 tablet  0  . amLODipine (NORVASC) 10 MG tablet TAKE 1 TABLET (10 MG TOTAL) BY MOUTH DAILY.  30 tablet  5  . atorvastatin (LIPITOR) 40 MG tablet Take 1 tablet (40 mg total) by mouth daily.  30 tablet  11  . Calcium Carbonate-Vitamin D (CALCIUM 600+D) 600-400 MG-UNIT per tablet Take 1 tablet by mouth daily.        . Cholecalciferol (VITAMIN D3) 2000 UNITS TABS Take 1 capsule by mouth daily.        Marland Kitchen dicyclomine (BENTYL) 10 MG capsule Take 1 capsule (10 mg total) by mouth 2 (two) times daily.  60 capsule  5  . fenofibrate 160 MG tablet TAKE 1 TABLET BY MOUTH AT BEDTIME  30 tablet  5  . hydrochlorothiazide (MICROZIDE) 12.5 MG capsule TAKE 1 CAPSULE BY MOUTH DAILY  30 capsule  2  . lisinopril (PRINIVIL,ZESTRIL) 40 MG tablet Take 1 tablet (40 mg total) by mouth daily.  30 tablet  2  . metFORMIN (GLUCOPHAGE) 500 MG  tablet 2 tablets in AM  And 1 tablet in evening  90 tablet  2  . Omega-3 Fatty Acids (FISH OIL) 1200 MG CAPS Take 1 capsule by mouth daily.        . Polyethylene Glycol 3350 (MIRALAX PO) Take by mouth 2 (two) times daily.      . Probiotic Product (ALIGN PO) Take 1 capsule by mouth daily.      . citalopram (CELEXA) 20 MG tablet TAKE 1 TABLET EVERY DAY  30 tablet  11  . docusate sodium (COLACE) 50 MG capsule Take by mouth 2 (two) times daily.        . hydrocortisone (PROCTOZONE-HC) 2.5 % rectal cream Place rectally 2 (two) times daily.  30 g  0   No current facility-administered medications for this visit.    SURGICAL HISTORY:  Past Surgical History  Procedure Laterality Date  . Abdominal  hysterectomy    . Breast surgery  05/11/2006    mastectomy    REVIEW OF SYSTEMS:   General: fatigue (-), night sweats (-), fever (-), pain (-) Lymph: palpable nodes (-) HEENT: vision changes (-), mucositis (-), gum bleeding (-), epistaxis (-) Cardiovascular: chest pain (-), palpitations (-) Pulmonary: shortness of breath (-), dyspnea on exertion (-), cough (-), hemoptysis (-) GI:  Early satiety (-), melena (-), dysphagia (-), nausea/vomiting (-), diarrhea (-) GU: dysuria (-), hematuria (-), incontinence (-) Musculoskeletal: joint swelling (-), joint pain (-), back pain (-) Neuro: weakness (-), numbness (-), headache (-), confusion (-) Skin: Rash (-), lesions (-), dryness (-) Psych: depression (-), suicidal/homicidal ideation (-), feeling of hopelessness (-)  HEALTH MAINTENANCE: Mammogram 12/06/12-normal Colonoscopy 2012 Bone Density: 05/31/10 (followed by PCP Dr. Jacelyn Grip) Pap Smear 05/2012 Eye Exam 02/2013 Vitamin D checked 3/13, 50 Lipid Panel 2/14  PHYSICAL EXAMINATION: Blood pressure 136/68, pulse 88, temperature 98 F (36.7 C), temperature source Oral, resp. rate 18, height '5\' 4"'  (1.626 m), weight 129 lb 4.8 oz (58.65 kg). Body mass index is 22.18 kg/(m^2). General: Patient is a well appearing female in no acute distress HEENT: PERRLA, sclerae anicteric no conjunctival pallor, MMM Neck: supple, no palpable adenopathy Lungs: bilateral wheezes and rhonchi with upper airway transmitted sounds Cardiovascular: regular rate rhythm, S1, S2, no murmurs, rubs or gallops Abdomen: Soft, non-tender, non-distended, normoactive bowel sounds, no HSM Extremities: warm and well perfused, no clubbing, cyanosis, or edema Skin: No rashes or lesions Neuro: Non-focal Breasts: left breast mastectomy site well healed, no nodularity, right breast no nodules, masses, or skin changes noted  ECOG PERFORMANCE STATUS: 0 - Asymptomatic      LABORATORY DATA: Lab Results  Component Value Date   WBC 9.8  03/03/2014   HGB 13.6 03/03/2014   HCT 40.9 03/03/2014   MCV 83.1 03/03/2014   PLT 278 03/03/2014      Chemistry      Component Value Date/Time   NA 141 03/03/2014 1003   NA 138 11/01/2013 1338   NA 134* 05/09/2013 0911   K 4.4 03/03/2014 1003   K 5.2 11/01/2013 1338   CL 93* 11/01/2013 1338   CL 92* 02/28/2013 1240   CO2 33* 03/03/2014 1003   CO2 28 11/01/2013 1338   BUN 14.5 03/03/2014 1003   BUN 10 11/01/2013 1338   BUN 16 05/09/2013 0911   CREATININE 0.8 03/03/2014 1003   CREATININE 0.70 11/01/2013 1338   CREATININE 0.76 05/09/2013 0911      Component Value Date/Time   CALCIUM 10.5* 03/03/2014 1003   CALCIUM 10.6* 11/01/2013  1338   ALKPHOS 23* 03/03/2014 1003   ALKPHOS 21* 11/01/2013 1338   AST 14 03/03/2014 1003   AST 12 11/01/2013 1338   ALT 14 03/03/2014 1003   ALT 10 11/01/2013 1338   BILITOT 0.40 03/03/2014 1003   BILITOT 0.2 11/01/2013 1338       RADIOGRAPHIC STUDIES:  No results found.  ASSESSMENT:   1. Ms. Yetter is a 79 year old female with history of stage IA ER positive, PR positive, HER-2/neu negative breast cancer of the left breast.  She has underwent a left mastectomy followed by the successful completion of a full 5 years of anti-estrogen therapy with Arimidex.  She is up to date on her health maintenance. She has no evidence of recurrence  2.  She does have a h/o of osteopenia.  Her PCP Dr. Jacelyn Grip follows this and she is on Fosamax.    3. Tobacco abuse: refer to smoking cessation program  4. Follow up: 1 year with blood  work   All questions were answered. The patient knows to call the clinic with any problems, questions or concerns. We can certainly see the patient much sooner if necessary.  I spent 20 minutes counseling the patient face to face. The total time spent in the appointment was 30 minutes.

## 2014-03-03 NOTE — Patient Instructions (Signed)
Smoking Cessation Quitting smoking is important to your health and has many advantages. However, it is not always easy to quit since nicotine is a very addictive drug. Often times, people try 3 times or more before being able to quit. This document explains the best ways for you to prepare to quit smoking. Quitting takes hard work and a lot of effort, but you can do it. ADVANTAGES OF QUITTING SMOKING  You will live longer, feel better, and live better.  Your body will feel the impact of quitting smoking almost immediately.  Within 20 minutes, blood pressure decreases. Your pulse returns to its normal level.  After 8 hours, carbon monoxide levels in the blood return to normal. Your oxygen level increases.  After 24 hours, the chance of having a heart attack starts to decrease. Your breath, hair, and body stop smelling like smoke.  After 48 hours, damaged nerve endings begin to recover. Your sense of taste and smell improve.  After 72 hours, the body is virtually free of nicotine. Your bronchial tubes relax and breathing becomes easier.  After 2 to 12 weeks, lungs can hold more air. Exercise becomes easier and circulation improves.  The risk of having a heart attack, stroke, cancer, or lung disease is greatly reduced.  After 1 year, the risk of coronary heart disease is cut in half.  After 5 years, the risk of stroke falls to the same as a nonsmoker.  After 10 years, the risk of lung cancer is cut in half and the risk of other cancers decreases significantly.  After 15 years, the risk of coronary heart disease drops, usually to the level of a nonsmoker.  If you are pregnant, quitting smoking will improve your chances of having a healthy baby.  The people you live with, especially any children, will be healthier.  You will have extra money to spend on things other than cigarettes. QUESTIONS TO THINK ABOUT BEFORE ATTEMPTING TO QUIT You may want to talk about your answers with your  caregiver.  Why do you want to quit?  If you tried to quit in the past, what helped and what did not?  What will be the most difficult situations for you after you quit? How will you plan to handle them?  Who can help you through the tough times? Your family? Friends? A caregiver?  What pleasures do you get from smoking? What ways can you still get pleasure if you quit? Here are some questions to ask your caregiver:  How can you help me to be successful at quitting?  What medicine do you think would be best for me and how should I take it?  What should I do if I need more help?  What is smoking withdrawal like? How can I get information on withdrawal? GET READY  Set a quit date.  Change your environment by getting rid of all cigarettes, ashtrays, matches, and lighters in your home, car, or work. Do not let people smoke in your home.  Review your past attempts to quit. Think about what worked and what did not. GET SUPPORT AND ENCOURAGEMENT You have a better chance of being successful if you have help. You can get support in many ways.  Tell your family, friends, and co-workers that you are going to quit and need their support. Ask them not to smoke around you.  Get individual, group, or telephone counseling and support. Programs are available at local hospitals and health centers. Call your local health department for   information about programs in your area.  Spiritual beliefs and practices may help some smokers quit.  Download a "quit meter" on your computer to keep track of quit statistics, such as how long you have gone without smoking, cigarettes not smoked, and money saved.  Get a self-help book about quitting smoking and staying off of tobacco. LEARN NEW SKILLS AND BEHAVIORS  Distract yourself from urges to smoke. Talk to someone, go for a walk, or occupy your time with a task.  Change your normal routine. Take a different route to work. Drink tea instead of coffee.  Eat breakfast in a different place.  Reduce your stress. Take a hot bath, exercise, or read a book.  Plan something enjoyable to do every day. Reward yourself for not smoking.  Explore interactive web-based programs that specialize in helping you quit. GET MEDICINE AND USE IT CORRECTLY Medicines can help you stop smoking and decrease the urge to smoke. Combining medicine with the above behavioral methods and support can greatly increase your chances of successfully quitting smoking.  Nicotine replacement therapy helps deliver nicotine to your body without the negative effects and risks of smoking. Nicotine replacement therapy includes nicotine gum, lozenges, inhalers, nasal sprays, and skin patches. Some may be available over-the-counter and others require a prescription.  Antidepressant medicine helps people abstain from smoking, but how this works is unknown. This medicine is available by prescription.  Nicotinic receptor partial agonist medicine simulates the effect of nicotine in your brain. This medicine is available by prescription. Ask your caregiver for advice about which medicines to use and how to use them based on your health history. Your caregiver will tell you what side effects to look out for if you choose to be on a medicine or therapy. Carefully read the information on the package. Do not use any other product containing nicotine while using a nicotine replacement product.  RELAPSE OR DIFFICULT SITUATIONS Most relapses occur within the first 3 months after quitting. Do not be discouraged if you start smoking again. Remember, most people try several times before finally quitting. You may have symptoms of withdrawal because your body is used to nicotine. You may crave cigarettes, be irritable, feel very hungry, cough often, get headaches, or have difficulty concentrating. The withdrawal symptoms are only temporary. They are strongest when you first quit, but they will go away within  10 14 days. To reduce the chances of relapse, try to:  Avoid drinking alcohol. Drinking lowers your chances of successfully quitting.  Reduce the amount of caffeine you consume. Once you quit smoking, the amount of caffeine in your body increases and can give you symptoms, such as a rapid heartbeat, sweating, and anxiety.  Avoid smokers because they can make you want to smoke.  Do not let weight gain distract you. Many smokers will gain weight when they quit, usually less than 10 pounds. Eat a healthy diet and stay active. You can always lose the weight gained after you quit.  Find ways to improve your mood other than smoking. FOR MORE INFORMATION  www.smokefree.gov  Document Released: 12/06/2001 Document Revised: 06/12/2012 Document Reviewed: 03/22/2012 [employer] Patient Information 2014 [employer].  

## 2014-03-03 NOTE — Telephone Encounter (Signed)
, °

## 2014-03-04 NOTE — Telephone Encounter (Signed)
Patient needs to be seen. Has exceeded time since last visit. Limited quantity refilled. Needs to bring all medications to next appointment.   

## 2014-03-04 NOTE — Telephone Encounter (Signed)
Last seen 11/01/13  FPW  If approved route [facility]

## 2014-03-06 ENCOUNTER — Other Ambulatory Visit: Payer: Self-pay | Admitting: *Deleted

## 2014-03-06 DIAGNOSIS — E785 Hyperlipidemia, unspecified: Secondary | ICD-10-CM

## 2014-03-06 DIAGNOSIS — F411 Generalized anxiety disorder: Secondary | ICD-10-CM

## 2014-03-06 MED ORDER — CITALOPRAM HYDROBROMIDE 20 MG PO TABS: ORAL_TABLET | ORAL | Status: DC

## 2014-03-06 MED ORDER — FENOFIBRATE 160 MG PO TABS: 160.0000 mg | ORAL_TABLET | Freq: Every day | ORAL | Status: DC

## 2014-03-06 MED ORDER — ATORVASTATIN CALCIUM 40 MG PO TABS: 40.0000 mg | ORAL_TABLET | Freq: Every day | ORAL | Status: DC

## 2014-03-06 MED ORDER — ALENDRONATE SODIUM 70 MG PO TABS: 70.0000 mg | ORAL_TABLET | ORAL | Status: DC

## 2014-03-06 MED ORDER — AMLODIPINE BESYLATE 10 MG PO TABS: 10.0000 mg | ORAL_TABLET | Freq: Every day | ORAL | Status: DC

## 2014-03-06 MED ORDER — HYDROCHLOROTHIAZIDE 12.5 MG PO CAPS: 12.5000 mg | ORAL_CAPSULE | Freq: Every day | ORAL | Status: DC

## 2014-03-17 ENCOUNTER — Other Ambulatory Visit: Payer: Self-pay | Admitting: *Deleted

## 2014-03-17 MED ORDER — METFORMIN HCL 500 MG PO TABS: ORAL_TABLET | ORAL | Status: DC

## 2014-03-17 NOTE — Telephone Encounter (Signed)
Call patient : Prescription refilled & sent to pharmacy in [place]. 

## 2014-03-17 NOTE — Telephone Encounter (Signed)
Last labs 11/14

## 2014-03-19 ENCOUNTER — Other Ambulatory Visit: Payer: Self-pay | Admitting: Family Medicine

## 2014-03-20 ENCOUNTER — Telehealth: Payer: Self-pay | Admitting: Family Medicine

## 2014-03-20 NOTE — Telephone Encounter (Signed)
Patient needs to be seen. Patient has exceeded limit since last visit. Refill denied. Bring all medications at next office visit. 

## 2014-03-20 NOTE — Telephone Encounter (Signed)
Patient last seen in office on 11-01-13. Rx last filled on 03-04-14. Please advise. If approved please route to [facility] so nurse can phone in to pharmacy

## 2014-03-24 ENCOUNTER — Ambulatory Visit (INDEPENDENT_AMBULATORY_CARE_PROVIDER_SITE_OTHER): Payer: Medicare Other | Admitting: Family Medicine

## 2014-03-24 ENCOUNTER — Encounter: Payer: Self-pay | Admitting: Family Medicine

## 2014-03-24 VITALS — BP 127/75 | HR 84 | Temp 97.9°F | Ht 64.0 in | Wt 127.8 lb

## 2014-03-24 DIAGNOSIS — F17200 Nicotine dependence, unspecified, uncomplicated: Secondary | ICD-10-CM

## 2014-03-24 DIAGNOSIS — Z720 Tobacco use: Secondary | ICD-10-CM

## 2014-03-24 DIAGNOSIS — C50919 Malignant neoplasm of unspecified site of unspecified female breast: Secondary | ICD-10-CM

## 2014-03-24 DIAGNOSIS — C50912 Malignant neoplasm of unspecified site of left female breast: Secondary | ICD-10-CM

## 2014-03-24 DIAGNOSIS — E785 Hyperlipidemia, unspecified: Secondary | ICD-10-CM

## 2014-03-24 DIAGNOSIS — F411 Generalized anxiety disorder: Secondary | ICD-10-CM

## 2014-03-24 DIAGNOSIS — E119 Type 2 diabetes mellitus without complications: Secondary | ICD-10-CM

## 2014-03-24 DIAGNOSIS — I10 Essential (primary) hypertension: Secondary | ICD-10-CM

## 2014-03-24 DIAGNOSIS — M810 Age-related osteoporosis without current pathological fracture: Secondary | ICD-10-CM

## 2014-03-24 LAB — POCT GLYCOSYLATED HEMOGLOBIN (HGB A1C): Hemoglobin A1C: 5.7

## 2014-03-24 MED ORDER — ALPRAZOLAM 0.5 MG PO TABS: ORAL_TABLET | ORAL | Status: DC

## 2014-03-24 NOTE — Patient Instructions (Addendum)
Smoking Cessation Quitting smoking is important to your health and has many advantages. However, it is not always easy to quit since nicotine is a very addictive drug. Often times, people try 3 times or more before being able to quit. This document explains the best ways for you to prepare to quit smoking. Quitting takes hard work and a lot of effort, but you can do it. ADVANTAGES OF QUITTING SMOKING  You will live longer, feel better, and live better.  Your body will feel the impact of quitting smoking almost immediately.  Within 20 minutes, blood pressure decreases. Your pulse returns to its normal level.  After 8 hours, carbon monoxide levels in the blood return to normal. Your oxygen level increases.  After 24 hours, the chance of having a heart attack starts to decrease. Your breath, hair, and body stop smelling like smoke.  After 48 hours, damaged nerve endings begin to recover. Your sense of taste and smell improve.  After 72 hours, the body is virtually free of nicotine. Your bronchial tubes relax and breathing becomes easier.  After 2 to 12 weeks, lungs can hold more air. Exercise becomes easier and circulation improves.  The risk of having a heart attack, stroke, cancer, or lung disease is greatly reduced.  After 1 year, the risk of coronary heart disease is cut in half.  After 5 years, the risk of stroke falls to the same as a nonsmoker.  After 10 years, the risk of lung cancer is cut in half and the risk of other cancers decreases significantly.  After 15 years, the risk of coronary heart disease drops, usually to the level of a nonsmoker.  If you are pregnant, quitting smoking will improve your chances of having a healthy baby.  The people you live with, especially any children, will be healthier.  You will have extra money to spend on things other than cigarettes. QUESTIONS TO THINK ABOUT BEFORE ATTEMPTING TO QUIT You may want to talk about your answers with your  caregiver.  Why do you want to quit?  If you tried to quit in the past, what helped and what did not?  What will be the most difficult situations for you after you quit? How will you plan to handle them?  Who can help you through the tough times? Your family? Friends? A caregiver?  What pleasures do you get from smoking? What ways can you still get pleasure if you quit? Here are some questions to ask your caregiver:  How can you help me to be successful at quitting?  What medicine do you think would be best for me and how should I take it?  What should I do if I need more help?  What is smoking withdrawal like? How can I get information on withdrawal? GET READY  Set a quit date.  Change your environment by getting rid of all cigarettes, ashtrays, matches, and lighters in your home, car, or work. Do not let people smoke in your home.  Review your past attempts to quit. Think about what worked and what did not. GET SUPPORT AND ENCOURAGEMENT You have a better chance of being successful if you have help. You can get support in many ways.  Tell your family, friends, and co-workers that you are going to quit and need their support. Ask them not to smoke around you.  Get individual, group, or telephone counseling and support. Programs are available at local hospitals and health centers. Call your local health department for   information about programs in your area.  Spiritual beliefs and practices may help some smokers quit.  Download a "quit meter" on your computer to keep track of quit statistics, such as how long you have gone without smoking, cigarettes not smoked, and money saved.  Get a self-help book about quitting smoking and staying off of tobacco. [place] yourself from urges to smoke. Talk to someone, go for a walk, or occupy your time with a task.  Change your normal routine. Take a different route to work. Drink tea instead of coffee.  Eat breakfast in a different place.  Reduce your stress. Take a hot bath, exercise, or read a book.  Plan something enjoyable to do every day. Reward yourself for not smoking.  Explore interactive web-based programs that specialize in helping you quit. GET MEDICINE AND USE IT CORRECTLY Medicines can help you stop smoking and decrease the urge to smoke. Combining medicine with the above behavioral methods and support can greatly increase your chances of successfully quitting smoking.  Nicotine replacement therapy helps deliver nicotine to your body without the negative effects and risks of smoking. Nicotine replacement therapy includes nicotine gum, lozenges, inhalers, nasal sprays, and skin patches. Some may be available over-the-counter and others require a prescription.  Antidepressant medicine helps people abstain from smoking, but how this works is unknown. This medicine is available by prescription.  Nicotinic receptor partial agonist medicine simulates the effect of nicotine in your brain. This medicine is available by prescription. Ask your caregiver for advice about which medicines to use and how to use them based on your health history. Your caregiver will tell you what side effects to look out for if you choose to be on a medicine or therapy. Carefully read the information on the package. Do not use any other product containing nicotine while using a nicotine replacement product.  RELAPSE OR DIFFICULT SITUATIONS Most relapses occur within the first 3 months after quitting. Do not be discouraged if you start smoking again. Remember, most people try several times before finally quitting. You may have symptoms of withdrawal because your body is used to nicotine. You may crave cigarettes, be irritable, feel very hungry, cough often, get headaches, or have difficulty concentrating. The withdrawal symptoms are only temporary. They are strongest when you first quit, but they will go away within  10 14 days. To reduce the chances of relapse, try to:  Avoid drinking alcohol. Drinking lowers your chances of successfully quitting.  Reduce the amount of caffeine you consume. Once you quit smoking, the amount of caffeine in your body increases and can give you symptoms, such as a rapid heartbeat, sweating, and anxiety.  Avoid smokers because they can make you want to smoke.  Do not let weight gain distract you. Many smokers will gain weight when they quit, usually less than 10 pounds. Eat a healthy diet and stay active. You can always lose the weight gained after you quit.  Find ways to improve your mood other than smoking. FOR MORE INFORMATION  www.smokefree.gov  Document Released: 12/06/2001 Document Revised: 06/12/2012 Document Reviewed: 03/22/2012 [facility] Patient Information 2014 [place], [place].   DASH Diet The DASH diet stands for "Dietary Approaches to Stop Hypertension." It is a healthy eating plan that has been shown to reduce high blood pressure (hypertension) in as little as 14 days, while also possibly providing other significant health benefits. These other health benefits include reducing the risk of breast cancer after menopause and  reducing the risk of type 2 diabetes, heart disease, colon cancer, and stroke. Health benefits also include weight loss and slowing kidney failure in patients with chronic kidney disease.  DIET GUIDELINES  Limit salt (sodium). Your diet should contain less than 1500 mg of sodium daily.  Limit refined or processed carbohydrates. Your diet should include mostly whole grains. Desserts and added sugars should be used sparingly.  Include small amounts of heart-healthy fats. These types of fats include nuts, oils, and tub margarine. Limit saturated and trans fats. These fats have been shown to be harmful in the body. CHOOSING FOODS  The following food groups are based on a 2000 calorie diet. See your Registered Dietitian for individual calorie  needs. Grains and Grain Products (6 to 8 servings daily)  Eat More Often: Whole-wheat bread, brown rice, whole-grain or wheat pasta, quinoa, popcorn without added fat or salt (air popped).  Eat Less Often: White bread, white pasta, white rice, cornbread. Vegetables (4 to 5 servings daily)  Eat More Often: Fresh, frozen, and canned vegetables. Vegetables may be raw, steamed, roasted, or grilled with a minimal amount of fat.  Eat Less Often/Avoid: Creamed or fried vegetables. Vegetables in a cheese sauce. Fruit (4 to 5 servings daily)  Eat More Often: All fresh, canned (in natural juice), or frozen fruits. Dried fruits without added sugar. One hundred percent fruit juice ( cup [237 mL] daily).  Eat Less Often: Dried fruits with added sugar. Canned fruit in light or heavy syrup. [employer], Fish, and Poultry (2 servings or less daily. One serving is 3 to 4 oz [85-114 g]).  Eat More Often: Ninety percent or leaner ground beef, tenderloin, sirloin. Round cuts of beef, chicken breast, [place] breast. All fish. Grill, bake, or broil your meat. Nothing should be fried.  Eat Less Often/Avoid: Fatty cuts of meat, [place], or chicken leg, thigh, or wing. Fried cuts of meat or fish. Dairy (2 to 3 servings)  Eat More Often: Low-fat or fat-free milk, low-fat plain or light yogurt, reduced-fat or part-skim cheese.  Eat Less Often/Avoid: Milk (whole, 2%).Whole milk yogurt. Full-fat cheeses. Nuts, Seeds, and Legumes (4 to 5 servings per week)  Eat More Often: All without added salt.  Eat Less Often/Avoid: Salted nuts and seeds, canned beans with added salt. Fats and Sweets (limited)  Eat More Often: Vegetable oils, tub margarines without trans fats, sugar-free gelatin. Mayonnaise and salad dressings.  Eat Less Often/Avoid: Coconut oils, palm oils, butter, stick margarine, cream, half and half, cookies, candy, pie. FOR MORE INFORMATION The Dash Diet Eating Plan: www.dashdiet.org Document  Released: 12/01/2011 Document Revised: 03/05/2012 Document Reviewed: 12/01/2011 [facility] Patient Information 2014 [place], [place].        Dr Paula Libra Recommendations  For nutrition information, I recommend books:  1).Eat to Live by Dr Excell Seltzer. 2).Prevent and Reverse Heart Disease by Dr Karl Luke. 3) Dr Janene Harvey Book:  Program to Reverse Diabetes  Exercise recommendations are:  If unable to walk, then the patient can exercise in a chair 3 times a day. By flapping arms like a bird gently and raising legs outwards to the front.  If ambulatory, the patient can go for walks for 30 minutes 3 times a week. Then increase the intensity and duration as tolerated.  Goal is to try to attain exercise frequency to 5 times a week.  If applicable: Best to perform resistance exercises (machines or weights) 2 days a week and cardio type exercises 3 days per week.  Diabetes and Foot Care Diabetes may cause you to have problems because of poor blood supply (circulation) to your feet and legs. This may cause the skin on your feet to become thinner, break easier, and heal more slowly. Your skin may become dry, and the skin may peel and crack. You may also have nerve damage in your legs and feet causing decreased feeling in them. You may not notice minor injuries to your feet that could lead to infections or more serious problems. Taking care of your feet is one of the most important things you can do for yourself.  HOME CARE INSTRUCTIONS  Wear shoes at all times, even in the house. Do not go barefoot. Bare feet are easily injured.  Check your feet daily for blisters, cuts, and redness. If you cannot see the bottom of your feet, use a mirror or ask someone for help.  Wash your feet with warm water (do not use hot water) and mild soap. Then pat your feet and the areas between your toes until they are completely dry. Do not soak your feet as this can dry your skin.  Apply a  moisturizing lotion or petroleum jelly (that does not contain alcohol and is unscented) to the skin on your feet and to dry, brittle toenails. Do not apply lotion between your toes.  Trim your toenails straight across. Do not dig under them or around the cuticle. File the edges of your nails with an emery board or nail file.  Do not cut corns or calluses or try to remove them with medicine.  Wear clean socks or stockings every day. Make sure they are not too tight. Do not wear knee-high stockings since they may decrease blood flow to your legs.  Wear shoes that fit properly and have enough cushioning. To break in new shoes, wear them for just a few hours a day. This prevents you from injuring your feet. Always look in your shoes before you put them on to be sure there are no objects inside.  Do not cross your legs. This may decrease the blood flow to your feet.  If you find a minor scrape, cut, or break in the skin on your feet, keep it and the skin around it clean and dry. These areas may be cleansed with mild soap and water. Do not cleanse the area with peroxide, alcohol, or iodine.  When you remove an adhesive bandage, be sure not to damage the skin around it.  If you have a wound, look at it several times a day to make sure it is healing.  Do not use heating pads or hot water bottles. They may burn your skin. If you have lost feeling in your feet or legs, you may not know it is happening until it is too late.  Make sure your health care provider performs a complete foot exam at least annually or more often if you have foot problems. Report any cuts, sores, or bruises to your health care provider immediately. SEEK MEDICAL CARE IF:   You have an injury that is not healing.  You have cuts or breaks in the skin.  You have an ingrown nail.  You notice redness on your legs or feet.  You feel burning or tingling in your legs or feet.  You have pain or cramps in your legs and  feet.  Your legs or feet are numb.  Your feet always feel cold. SEEK IMMEDIATE MEDICAL CARE IF:   There is increasing  redness, swelling, or pain in or around a wound.  There is a red line that goes up your leg.  Pus is coming from a wound.  You develop a fever or as directed by your health care provider.  You notice a bad smell coming from an ulcer or wound. Document Released: 12/09/2000 Document Revised: 08/14/2013 Document Reviewed: 05/21/2013 [facility] Patient Information 2014 [place].

## 2014-03-24 NOTE — Telephone Encounter (Signed)
Done today.

## 2014-03-24 NOTE — Progress Notes (Signed)
Patient ID: Jacqueline Cervantes, female   DOB: November 18, 1935, 79 y.o.   MRN: [id number] SUBJECTIVE: CC: Chief Complaint  Patient presents with  . Follow-up    ck up and med refils -alprazolam  takies it twice a day     HPI:  Patient is here for follow up of Diabetes Mellitus/HTN/HLD: Symptoms evaluated: Denies Nocturia ,Denies Urinary Frequency , denies Blurred vision ,deniesDizziness,denies.Dysuria,denies paresthesias, denies extremity pain or ulcers.Marland Kitchendenies chest pain. has had an annual eye exam. do check the feet. Does check CBGs. Average NTI:[id number] Denies episodes of hypoglycemia. Does have an emergency hypoglycemic plan. admits toCompliance with medications. Denies Problems with medications.   Stressed due to death of a sister and the impending death of her brother.  Continues to smoke, less amounts.  Past Medical History  Diagnosis Date  . Hypercholesteremia   . Diabetes mellitus   . History of breast cancer   . Arthritis   . Hypertension   . GERD (gastroesophageal reflux disease)   . Cancer     Breast   Past Surgical History  Procedure Laterality Date  . Abdominal hysterectomy    . Breast surgery  05/11/2006    mastectomy   History   Social History  . Marital Status: Married    Spouse Name: N/A    Number of Children: N/A  . Years of Education: N/A   Occupational History  . Not on file.   Social History Main Topics  . Smoking status: Current Every Day Smoker -- 0.50 packs/day  . Smokeless tobacco: Not on file     Comment: pt quit 10/2012 with help of chantix  . Alcohol Use: No  . Drug Use: No  . Sexual Activity: Not Currently   Other Topics Concern  . Not on file   Social History Narrative  . No narrative on file   Family History  Problem Relation Age of Onset  . Diabetes Mother   . Cancer Father    Current Outpatient Prescriptions on File Prior to Visit  Medication Sig Dispense Refill  . alendronate (FOSAMAX) 70 MG tablet Take 1 tablet (70  mg total) by mouth once a week. Take with a full glass of water on an empty stomach.  12 tablet  1  . amLODipine (NORVASC) 10 MG tablet Take 1 tablet (10 mg total) by mouth daily.  90 tablet  1  . atorvastatin (LIPITOR) 40 MG tablet Take 1 tablet (40 mg total) by mouth daily.  90 tablet  0  . Calcium Carbonate-Vitamin D (CALCIUM 600+D) 600-400 MG-UNIT per tablet Take 1 tablet by mouth daily.        . Cholecalciferol (VITAMIN D3) 2000 UNITS TABS Take 1 capsule by mouth daily.        . citalopram (CELEXA) 20 MG tablet TAKE 1 TABLET EVERY DAY  90 tablet  0  . dicyclomine (BENTYL) 10 MG capsule Take 1 capsule (10 mg total) by mouth 2 (two) times daily.  60 capsule  5  . docusate sodium (COLACE) 50 MG capsule Take by mouth 2 (two) times daily.        . fenofibrate 160 MG tablet Take 1 tablet (160 mg total) by mouth daily.  90 tablet  0  . hydrochlorothiazide (MICROZIDE) 12.5 MG capsule Take 1 capsule (12.5 mg total) by mouth daily.  90 capsule  1  . hydrocortisone (PROCTOZONE-HC) 2.5 % rectal cream Place rectally 2 (two) times daily.  30 g  0  . lisinopril (PRINIVIL,ZESTRIL) 40 MG tablet  Take 1 tablet (40 mg total) by mouth daily.  30 tablet  2  . metFORMIN (GLUCOPHAGE) 500 MG tablet 2 tablets in AM  And 1 tablet in evening  90 tablet  0  . Omega-3 Fatty Acids (FISH OIL) 1200 MG CAPS Take 1 capsule by mouth daily.        . Polyethylene Glycol 3350 (MIRALAX PO) Take by mouth 2 (two) times daily.      . Probiotic Product (ALIGN PO) Take 1 capsule by mouth daily.       No current facility-administered medications on file prior to visit.   No Known Allergies Immunization History  Administered Date(s) Administered  . Influenza,inj,Quad PF,36+ Mos 11/01/2013   Prior to Admission medications   Medication Sig Start Date End Date Taking? Authorizing Provider  alendronate (FOSAMAX) 70 MG tablet Take 1 tablet (70 mg total) by mouth once a week. Take with a full glass of water on an empty stomach. 03/06/14   Yes Vernie Shanks, MD  ALPRAZolam Duanne Moron) 0.5 MG tablet TAKE 1 TABLET TWICE A DAY   Yes Vernie Shanks, MD  amLODipine (NORVASC) 10 MG tablet Take 1 tablet (10 mg total) by mouth daily. 03/06/14  Yes Vernie Shanks, MD  atorvastatin (LIPITOR) 40 MG tablet Take 1 tablet (40 mg total) by mouth daily. 03/06/14  Yes Vernie Shanks, MD  Calcium Carbonate-Vitamin D (CALCIUM 600+D) 600-400 MG-UNIT per tablet Take 1 tablet by mouth daily.     Yes Historical Provider, MD  Cholecalciferol (VITAMIN D3) 2000 UNITS TABS Take 1 capsule by mouth daily.     Yes Historical Provider, MD  citalopram (CELEXA) 20 MG tablet TAKE 1 TABLET EVERY DAY 03/06/14  Yes Vernie Shanks, MD  dicyclomine (BENTYL) 10 MG capsule Take 1 capsule (10 mg total) by mouth 2 (two) times daily. 08/02/13  Yes Vernie Shanks, MD  docusate sodium (COLACE) 50 MG capsule Take by mouth 2 (two) times daily.     Yes Historical Provider, MD  fenofibrate 160 MG tablet Take 1 tablet (160 mg total) by mouth daily. 03/06/14  Yes Vernie Shanks, MD  hydrochlorothiazide (MICROZIDE) 12.5 MG capsule Take 1 capsule (12.5 mg total) by mouth daily. 03/06/14  Yes Vernie Shanks, MD  hydrocortisone (PROCTOZONE-HC) 2.5 % rectal cream Place rectally 2 (two) times daily. 3/53/61  Yes Leighton Ruff, MD  lisinopril (PRINIVIL,ZESTRIL) 40 MG tablet Take 1 tablet (40 mg total) by mouth daily. 02/19/14  Yes Vernie Shanks, MD  metFORMIN (GLUCOPHAGE) 500 MG tablet 2 tablets in AM  And 1 tablet in evening 03/17/14  Yes Vernie Shanks, MD  Omega-3 Fatty Acids (FISH OIL) 1200 MG CAPS Take 1 capsule by mouth daily.     Yes Historical Provider, MD  Polyethylene Glycol 3350 (MIRALAX PO) Take by mouth 2 (two) times daily.   Yes Historical Provider, MD  Probiotic Product (ALIGN PO) Take 1 capsule by mouth daily.   Yes Historical Provider, MD     ROS: As above in the HPI. All other systems are stable or negative.  OBJECTIVE: APPEARANCE:  Patient in no acute distress.The patient  appeared well nourished and normally developed. Acyanotic. Waist: VITAL SIGNS:BP 127/75  Pulse 84  Temp(Src) 97.9 F (36.6 C) (Oral)  Ht 5\' 4"  (1.626 m)  Wt 127 lb 12.8 oz (57.97 kg)  BMI 21.93 kg/m2 WF  SKIN: warm and  Dry without overt rashes, tattoos and scars  HEAD and Neck: without JVD, Head and  scalp: normal Eyes:No scleral icterus. Fundi normal, eye movements normal. Ears: Auricle normal, canal normal, Tympanic membranes normal, insufflation normal. Nose: normal Throat: normal Neck & thyroid: normal  CHEST & LUNGS: Chest wall: normal Lungs: Coarse breath sounds.  CVS: Reveals the PMI to be normally located. Regular rhythm, First and Second Heart sounds are normal,  absence of murmurs, rubs or gallops. Peripheral vasculature: Radial pulses: normal Dorsal pedis pulses: normal Posterior pulses: normal  ABDOMEN:  Appearance: normal Benign, no organomegaly, no masses, no Abdominal Aortic enlargement. No Guarding , no rebound. No Bruits. Bowel sounds: normal  RECTAL: N/A GU: N/A  EXTREMETIES: nonedematous.   NEUROLOGIC: oriented to time,place and person; nonfocal. Psychological: stressed and anxious about the losses and the implications to her life and her health  ASSESSMENT:  Tobacco user  Osteoporosis, unspecified  DM (diabetes mellitus) - Plan: POCT glycosylated hemoglobin (Hb A1C)  HLD (hyperlipidemia) - Plan: NMR, lipoprofile  HTN (hypertension)  Breast cancer, left breast  Anxiety state, unspecified - Plan: ALPRAZolam (XANAX) 0.5 MG tablet  PLAN:  DASH diet in the AVS. Smoking cessation in the AVS. counselled on smoking cessation.       Dr Paula Libra Recommendations  For nutrition information, I recommend books:  1).Eat to Live by Dr Excell Seltzer. 2).Prevent and Reverse Heart Disease by Dr Karl Luke. 3) Dr Janene Harvey Book:  Program to Reverse Diabetes  Exercise recommendations are:  If unable to walk, then the  patient can exercise in a chair 3 times a day. By flapping arms like a bird gently and raising legs outwards to the front.  If ambulatory, the patient can go for walks for 30 minutes 3 times a week. Then increase the intensity and duration as tolerated.  Goal is to try to attain exercise frequency to 5 times a week.  If applicable: Best to perform resistance exercises (machines or weights) 2 days a week and cardio type exercises 3 days per week.   Orders Placed This Encounter  Procedures  . NMR, lipoprofile  . POCT glycosylated hemoglobin (Hb A1C)   Meds ordered this encounter  Medications  . ALPRAZolam (XANAX) 0.5 MG tablet    Sig: TAKE 1 TABLET TWICE A DAY prn anxiety    Dispense:  45 tablet    Refill:  0   Medications Discontinued During This Encounter  Medication Reason  . ALPRAZolam (XANAX) 0.5 MG tablet Reorder   Return in about 3 months (around 06/24/2014) for Recheck medical problems.  Jakaree Pickard P. Jacelyn Grip, M.D.

## 2014-03-26 LAB — NMR, LIPOPROFILE
Cholesterol: 128 mg/dL (ref <200)
HDL Cholesterol by NMR: 52 mg/dL (ref >=40)
HDL Particle Number: 40.8 umol/L (ref >=30.5)
LDL Particle Number: 951 nmol/L (ref <1000)
LDL Size: 20.3 nm — ABNORMAL LOW (ref >20.5)
LDLC SERPL CALC-MCNC: 56 mg/dL (ref <100)
LP-IR Score: 45 (ref <=45)
Small LDL Particle Number: 766 nmol/L — ABNORMAL HIGH (ref <=527)
Triglycerides by NMR: 99 mg/dL (ref <150)

## 2014-04-15 ENCOUNTER — Telehealth (INDEPENDENT_AMBULATORY_CARE_PROVIDER_SITE_OTHER): Payer: Self-pay

## 2014-04-15 DIAGNOSIS — K648 Other hemorrhoids: Secondary | ICD-10-CM

## 2014-04-15 MED ORDER — HYDROCORTISONE 2.5 % RE CREA: TOPICAL_CREAM | Freq: Two times a day (BID) | RECTAL | Status: DC

## 2014-04-15 NOTE — Addendum Note (Signed)
Addended by: Rosario Adie on: 1/83/3582 12:37 PM   Modules accepted: Orders

## 2014-04-15 NOTE — Telephone Encounter (Signed)
Anusol refilled.  Make sure the pt is taking a fiber supplement daily.  If she continues to have problems, should schedule a f/u visit.

## 2014-04-15 NOTE — Telephone Encounter (Signed)
Patient daughter calling into office requesting a refill on Proctozone cream.  Patient last seen in office on 09/17/2013.  Please advise on refill

## 2014-04-15 NOTE — Telephone Encounter (Signed)
Called and spoke to daughter to make aware that prescription has been sent to the pharmacy and that Dr. Marcello Moores said patient need's to schedule a follow visit.  Patient daughter said they will call our office to schedule that she has broken her foot and unable to drive her mother (patient) to our office.

## 2014-04-21 ENCOUNTER — Other Ambulatory Visit: Payer: Self-pay | Admitting: Family Medicine

## 2014-04-22 NOTE — Telephone Encounter (Signed)
Last seen and filled 03/24/14  [facility]  If approved route to nurse to call into [facility]

## 2014-04-24 NOTE — Telephone Encounter (Signed)
Rx ready for nurse to Phone in. 

## 2014-04-24 NOTE — Telephone Encounter (Signed)
Phoned in to pharmacy. 

## 2014-05-20 ENCOUNTER — Other Ambulatory Visit: Payer: Self-pay

## 2014-05-20 ENCOUNTER — Telehealth: Payer: Self-pay | Admitting: Family Medicine

## 2014-05-20 MED ORDER — METFORMIN HCL 500 MG PO TABS: ORAL_TABLET | ORAL | Status: DC

## 2014-05-20 NOTE — Telephone Encounter (Signed)
appt made

## 2014-05-21 ENCOUNTER — Encounter: Payer: Medicare Other | Admitting: Nurse Practitioner

## 2014-05-21 NOTE — Progress Notes (Signed)
   Subjective:    Patient ID: Jacqueline Cervantes, female    DOB: 1935/04/27, 79 y.o.   MRN: [id number]  HPI    Review of Systems     Objective:   Physical Exam        Assessment & Plan:  Erroneous

## 2014-05-28 ENCOUNTER — Other Ambulatory Visit: Payer: Self-pay | Admitting: Nurse Practitioner

## 2014-05-29 NOTE — Telephone Encounter (Signed)
This is okay x1

## 2014-05-29 NOTE — Telephone Encounter (Signed)
Last seen 03/24/14  FPW  If approved route to nurse to call into [facility]

## 2014-05-30 NOTE — Telephone Encounter (Signed)
[facility] 

## 2014-06-04 ENCOUNTER — Ambulatory Visit (INDEPENDENT_AMBULATORY_CARE_PROVIDER_SITE_OTHER): Payer: Medicare Other | Admitting: General Surgery

## 2014-06-04 ENCOUNTER — Encounter (INDEPENDENT_AMBULATORY_CARE_PROVIDER_SITE_OTHER): Payer: Self-pay | Admitting: General Surgery

## 2014-06-04 VITALS — BP 122/55 | HR 87 | Resp 20 | Ht 64.0 in | Wt 126.0 lb

## 2014-06-04 DIAGNOSIS — K648 Other hemorrhoids: Secondary | ICD-10-CM

## 2014-06-04 DIAGNOSIS — K6289 Other specified diseases of anus and rectum: Secondary | ICD-10-CM

## 2014-06-04 MED ORDER — HYDROCORTISONE ACETATE 25 MG RE SUPP: 25.0000 mg | Freq: Two times a day (BID) | RECTAL | Status: DC

## 2014-06-04 NOTE — Patient Instructions (Signed)
Use the suppositories twice a day for 5 days and then daily.  Take Miralax daily for help with your constipation.  Continue fiber supplements and stool softeners.       GETTING TO GOOD BOWEL HEALTH. Irregular bowel habits such as constipation can lead to many problems over time.  Having one soft bowel movement a day is the most important way to prevent further problems.  The anorectal canal is designed to handle stretching and feces to safely manage our ability to get rid of solid waste (feces, poop, stool) out of our body.  BUT, hard constipated stools can act like ripping concrete bricks causing inflamed hemorrhoids, anal fissures, abdominal pain and bloating.     The goal: ONE SOFT BOWEL MOVEMENT A DAY!  To have soft, regular bowel movements:    Drink at least 8 tall glasses of water a day.     Take plenty of fiber.  Fiber is the undigested part of plant food that passes into the colon, acting s "natures broom" to encourage bowel motility and movement.  Fiber can absorb and hold large amounts of water. This results in a larger, bulkier stool, which is soft and easier to pass. Work gradually over several weeks up to 6 servings a day of fiber (25g a day even more if needed) in the form of: o Vegetables -- Root (potatoes, carrots, turnips), leafy green (lettuce, salad greens, celery, spinach), or cooked high residue (cabbage, broccoli, etc) o Fruit -- Fresh (unpeeled skin & pulp), Dried (prunes, apricots, cherries, etc ),  or stewed ( applesauce)  o Whole grain breads, pasta, etc (whole wheat)  o Bran cereals    Bulking Agents -- This type of water-retaining fiber generally is easily obtained each day by one of the following:  o Psyllium bran -- The psyllium plant is remarkable because its ground seeds can retain so much water. This product is available as Metamucil, Konsyl, Effersyllium, Per Diem Fiber, or the less expensive generic preparation in drug and health food stores. Although labeled a  laxative, it really is not a laxative.  o Methylcellulose -- This is another fiber derived from wood which also retains water. It is available as Citrucel. o Polyethylene Glycol - and "artificial" fiber commonly called Miralax or Glycolax.  It is helpful for people with gassy or bloated feelings with regular fiber o Flax Seed - a less gassy fiber than psyllium   No reading or other relaxing activity while on the toilet. If bowel movements take longer than 5 minutes, you are too constipated.   AVOID CONSTIPATION.  High fiber and water intake usually takes care of this.  Sometimes a laxative is needed to stimulate more frequent bowel movements, but    Laxatives are not a good long-term solution as it can wear the colon out. o Osmotics (Milk of Magnesia, Fleets phosphosoda, Magnesium citrate, MiraLax, GoLytely) are safer than  o Stimulants (Senokot, Castor Oil, Dulcolax, Ex Lax)    o Do not take laxatives for more than 7days in a row.    IF SEVERELY CONSTIPATED, try a Bowel Retraining Program: o Do not use laxatives.  o Eat a diet high in roughage, such as bran cereals and leafy vegetables.  o Drink six (6) ounces of prune or apricot juice each morning.  o Eat two (2) large servings of stewed fruit each day.  o Take one (1) heaping tablespoon of a psyllium-based bulking agent twice a day. Use sugar-free sweetener when possible to avoid excessive  calories.  o Eat a normal breakfast.  o Set aside 15 minutes after breakfast to sit on the toilet, but do not strain to have a bowel movement.  o If you do not have a bowel movement by the third day, use an enema and repeat the above steps.

## 2014-06-04 NOTE — Progress Notes (Signed)
Jacqueline Cervantes is a 79 y.o. female who is here for a follow up visit regarding her hemorrhoids.  She is having anal pain and still has constipation.  She is taking a fiber supplement twice a day and states that she is drinking plenty of water.  She denies any bleeding.  She does feel that she has prolapsing tissue.  She is using laxatives on a regular basis.  Objective: Filed Vitals:   06/04/14 1051  BP: 122/55  Pulse: 87  Resp: 20    General appearance: alert and cooperative Resp: clear to auscultation bilaterally Cardio: regular rate and rhythm Anal: slight sphincter htn, unable to do complete anoscopy due to pain.  Assessment and Plan: COntinue fiber, add miralax 1-3 times and day for constipation.  Use suppositories to decrease anal inflammation.  RTO in 1 month for re-examination.  Hopefully once inflammation has decreased, I can get a better exam to see if something surgical needs to be done.      Rosario Adie, MD [facility], [place]

## 2014-06-05 ENCOUNTER — Other Ambulatory Visit: Payer: Self-pay | Admitting: Nurse Practitioner

## 2014-06-06 ENCOUNTER — Other Ambulatory Visit: Payer: Self-pay | Admitting: *Deleted

## 2014-06-06 MED ORDER — LISINOPRIL 40 MG PO TABS: 40.0000 mg | ORAL_TABLET | Freq: Every day | ORAL | Status: DC

## 2014-06-19 ENCOUNTER — Other Ambulatory Visit: Payer: Self-pay | Admitting: Family Medicine

## 2014-06-20 ENCOUNTER — Other Ambulatory Visit: Payer: Self-pay | Admitting: *Deleted

## 2014-06-20 DIAGNOSIS — F411 Generalized anxiety disorder: Secondary | ICD-10-CM

## 2014-06-20 MED ORDER — CITALOPRAM HYDROBROMIDE 20 MG PO TABS: ORAL_TABLET | ORAL | Status: DC

## 2014-06-21 ENCOUNTER — Other Ambulatory Visit: Payer: Self-pay | Admitting: Family Medicine

## 2014-06-30 ENCOUNTER — Other Ambulatory Visit: Payer: Self-pay | Admitting: Family Medicine

## 2014-07-02 ENCOUNTER — Other Ambulatory Visit: Payer: Self-pay | Admitting: *Deleted

## 2014-07-02 DIAGNOSIS — E785 Hyperlipidemia, unspecified: Secondary | ICD-10-CM

## 2014-07-02 MED ORDER — ATORVASTATIN CALCIUM 40 MG PO TABS
40.0000 mg | ORAL_TABLET | Freq: Every day | ORAL | Status: DC
Start: 2014-07-02 — End: 2014-07-11

## 2014-07-02 NOTE — Telephone Encounter (Signed)
Patient last seen in office on 03-24-14. Patient of Dr Jacelyn Grip Rx last filled on 05-30-14 for #[phone]. Please advise. If approved please route to pool A so nurse can call to pharmacy

## 2014-07-02 NOTE — Telephone Encounter (Signed)
Called in.

## 2014-07-02 NOTE — Telephone Encounter (Signed)
This is okay to refill x1. Please schedule her a visit with a provider for future refills

## 2014-07-06 ENCOUNTER — Encounter (HOSPITAL_COMMUNITY): Payer: Self-pay | Admitting: Emergency Medicine

## 2014-07-06 ENCOUNTER — Emergency Department (HOSPITAL_COMMUNITY): Payer: Medicare Other

## 2014-07-06 ENCOUNTER — Emergency Department (HOSPITAL_COMMUNITY)
Admission: EM | Admit: 2014-07-06 | Discharge: 2014-07-06 | Disposition: A | Discharge status: Home / Self Care | Payer: Medicare Other | Attending: Emergency Medicine | Admitting: Emergency Medicine

## 2014-07-06 DIAGNOSIS — Z79899 Other long term (current) drug therapy: Secondary | ICD-10-CM | POA: Insufficient documentation

## 2014-07-06 DIAGNOSIS — Z7983 Long term (current) use of bisphosphonates: Secondary | ICD-10-CM | POA: Insufficient documentation

## 2014-07-06 DIAGNOSIS — Z853 Personal history of malignant neoplasm of breast: Secondary | ICD-10-CM | POA: Insufficient documentation

## 2014-07-06 DIAGNOSIS — R4182 Altered mental status, unspecified: Secondary | ICD-10-CM

## 2014-07-06 DIAGNOSIS — F17200 Nicotine dependence, unspecified, uncomplicated: Secondary | ICD-10-CM | POA: Insufficient documentation

## 2014-07-06 DIAGNOSIS — E119 Type 2 diabetes mellitus without complications: Secondary | ICD-10-CM | POA: Insufficient documentation

## 2014-07-06 DIAGNOSIS — F29 Unspecified psychosis not due to a substance or known physiological condition: Secondary | ICD-10-CM | POA: Insufficient documentation

## 2014-07-06 DIAGNOSIS — Z8739 Personal history of other diseases of the musculoskeletal system and connective tissue: Secondary | ICD-10-CM | POA: Insufficient documentation

## 2014-07-06 DIAGNOSIS — E7800 Pure hypercholesterolemia, unspecified: Secondary | ICD-10-CM | POA: Insufficient documentation

## 2014-07-06 DIAGNOSIS — Z8719 Personal history of other diseases of the digestive system: Secondary | ICD-10-CM | POA: Insufficient documentation

## 2014-07-06 LAB — URINALYSIS, ROUTINE W REFLEX MICROSCOPIC
BILIRUBIN URINE: NEGATIVE
Glucose, UA: NEGATIVE mg/dL
Hgb urine dipstick: NEGATIVE
KETONES UR: NEGATIVE mg/dL
Leukocytes, UA: NEGATIVE
NITRITE: NEGATIVE
PH: 6.5 (ref 5.0–8.0)
Protein, ur: NEGATIVE mg/dL
Specific Gravity, Urine: <1.005 — ABNORMAL LOW (ref 1.005–1.030)
Urobilinogen, UA: 0.2 mg/dL (ref 0.0–1.0)

## 2014-07-06 LAB — CBC WITH DIFFERENTIAL/PLATELET
BASOS ABS: 0 10*3/uL (ref 0.0–0.1)
Basophils Relative: 1 % (ref 0–1)
EOS PCT: 1 % (ref 0–5)
Eosinophils Absolute: 0.1 10*3/uL (ref 0.0–0.7)
HCT: 40.6 % (ref 36.0–46.0)
Hemoglobin: 13.9 g/dL (ref 12.0–15.0)
LYMPHS ABS: 3.1 10*3/uL (ref 0.7–4.0)
Lymphocytes Relative: 37 % (ref 12–46)
MCH: 27.9 pg (ref 26.0–34.0)
MCHC: 34.2 g/dL (ref 30.0–36.0)
MCV: 81.4 fL (ref 78.0–100.0)
Monocytes Absolute: 0.6 10*3/uL (ref 0.1–1.0)
Monocytes Relative: 7 % (ref 3–12)
Neutro Abs: 4.5 10*3/uL (ref 1.7–7.7)
Neutrophils Relative %: 54 % (ref 43–77)
Platelets: 315 10*3/uL (ref 150–400)
RBC: 4.99 MIL/uL (ref 3.87–5.11)
RDW: 15.2 % (ref 11.5–15.5)
WBC: 8.4 10*3/uL (ref 4.0–10.5)

## 2014-07-06 LAB — BASIC METABOLIC PANEL
Anion gap: 12 (ref 5–15)
BUN: 13 mg/dL (ref 6–23)
CALCIUM: 10.3 mg/dL (ref 8.4–10.5)
CO2: 31 mEq/L (ref 19–32)
CREATININE: 0.64 mg/dL (ref 0.50–1.10)
Chloride: 91 mEq/L — ABNORMAL LOW (ref 96–112)
GFR calc Af Amer: >90 mL/min (ref >90)
GFR, EST NON AFRICAN AMERICAN: 85 mL/min — AB (ref >90)
GLUCOSE: 71 mg/dL (ref 70–99)
Potassium: 4.4 mEq/L (ref 3.7–5.3)
Sodium: 134 mEq/L — ABNORMAL LOW (ref 137–147)

## 2014-07-06 LAB — TROPONIN I: Troponin I: <0.3 ng/mL (ref <0.30)

## 2014-07-06 NOTE — ED Notes (Signed)
Per family patient has had shaking "spells for last few days" but today patient had a violent "shaking spell this morning that lasted 3 minutes in which patient was unable to talk. Family states she was yelling out and unable to speak." Family also reports that patient has had been very "forgetful" lately. Patient alert to date but confused to hospital.

## 2014-07-06 NOTE — Discharge Instructions (Signed)
We did the following tests today:  CT head, CBC, chemistry panel, urinalysis, troponin, EKG.   They were all normal.  Followup your primary care Dr.

## 2014-07-06 NOTE — ED Notes (Signed)
Patient with no complaints at this time. Respirations even and unlabored. Skin warm/dry. Discharge instructions reviewed with patient at this time. Patient given opportunity to voice concerns/ask questions. Patient discharged at this time and left Emergency Department with steady gait.   

## 2014-07-06 NOTE — ED Provider Notes (Signed)
CSN: [id number]     Arrival date & time 07/06/14  1149 History  This chart was scribed for Nat Christen, MD by Vernell Barrier, ED scribe. This patient was seen in room APA14/APA14 and the patient's care was started at 12:16 PM.    Chief Complaint  Patient presents with  . Altered Mental Status   The history is provided by the patient and the spouse. No language interpreter was used.   HPI Comments: Jacqueline Cervantes is a 79 y.o. female who presents to the Emergency Department for shaking spell this morning. Asymptomatic currently. Pt has mild recollection of the events. States she was eating breakfast prior to the episode. Husband reports 1 sudden onset violent shaking spell in which pt had rhythmic movement with her hands back and forth while making noise as in attempt to speak that lasted for approximately 3-4 minutes before fianlly slowing down. Husband states he made her lay down on the bed. Pt proceeded to get up, states she was all right but husband reports she was still shaking. Family also reports she has been very forgetful lately.  PCP: [facility]  Past Medical History  Diagnosis Date  . Hypercholesteremia   . Diabetes mellitus   . History of breast cancer   . Arthritis   . Hypertension   . GERD (gastroesophageal reflux disease)   . Cancer     Breast   Past Surgical History  Procedure Laterality Date  . Abdominal hysterectomy    . Breast surgery  05/11/2006    mastectomy   Family History  Problem Relation Age of Onset  . Diabetes Mother   . Cancer Father    History  Substance Use Topics  . Smoking status: Current Every Day Smoker -- 0.50 packs/day for 51 years    Types: Cigarettes  . Smokeless tobacco: Never Used  . Alcohol Use: No   OB History   Grav Para Term Preterm Abortions TAB SAB Ect Mult Living   1 1 1       1      Review of Systems  Psychiatric/Behavioral: Positive for confusion.   A complete 10 system review of systems was obtained and  all systems are negative except as noted in the HPI and PMH.   Allergies  Review of patient's allergies indicates no known allergies.  Home Medications   Prior to Admission medications   Medication Sig Start Date End Date Taking? Authorizing Provider  alendronate (FOSAMAX) 70 MG tablet Take 70 mg by mouth once a week. Take with a full glass of water on an empty stomach. Takes on Fridays 03/06/14  Yes Vernie Shanks, MD  ALPRAZolam Duanne Moron) 0.5 MG tablet Take 0.5 mg by mouth 2 (two) times daily.   Yes Historical Provider, MD  amLODipine (NORVASC) 10 MG tablet Take 1 tablet (10 mg total) by mouth daily. 03/06/14  Yes Vernie Shanks, MD  atorvastatin (LIPITOR) 40 MG tablet Take 1 tablet (40 mg total) by mouth daily. 07/02/14  Yes Chipper Herb, MD  Calcium Carbonate-Vitamin D (CALCIUM 600+D) 600-400 MG-UNIT per tablet Take 1 tablet by mouth daily.     Yes Historical Provider, MD  citalopram (CELEXA) 20 MG tablet Take 20 mg by mouth daily.   Yes Historical Provider, MD  fenofibrate 160 MG tablet Take 1 tablet (160 mg total) by mouth daily. 03/06/14  Yes Vernie Shanks, MD  hydrochlorothiazide (MICROZIDE) 12.5 MG capsule Take 1 capsule (12.5 mg total) by mouth daily. 03/06/14  Yes Vernie Shanks, MD  lisinopril (PRINIVIL,ZESTRIL) 40 MG tablet Take 1 tablet (40 mg total) by mouth daily. 06/06/14  Yes Sharion Balloon, FNP  metFORMIN (GLUCOPHAGE) 500 MG tablet Take 500-1,000 mg by mouth 2 (two) times daily with a meal. Takes 1000 mg in the morning and 500 mg in the evening.   Yes Historical Provider, MD  Omega-3 Fatty Acids (FISH OIL PO) Take 1 capsule by mouth daily.   Yes Historical Provider, MD  polyethylene glycol (MIRALAX / GLYCOLAX) packet Take 17 g by mouth daily.   Yes Historical Provider, MD   Triage vitals: BP 166/67  Pulse 90  Temp(Src) 98.3 F (36.8 C) (Oral)  Resp 22  Ht 5\' 4"  (1.626 m)  Wt 125 lb (56.7 kg)  BMI 21.45 kg/m2  SpO2 95%  Physical Exam  Nursing note and vitals  reviewed. Constitutional: She is oriented to person, place, and time. She appears well-developed and well-nourished.  HENT:  Head: Normocephalic and atraumatic.  Eyes: Conjunctivae and EOM are normal. Pupils are equal, round, and reactive to light.  Neck: Normal range of motion. Neck supple.  Cardiovascular: Normal rate, regular rhythm and normal heart sounds.   Pulmonary/Chest: Effort normal and breath sounds normal.  Abdominal: Soft. Bowel sounds are normal.  Musculoskeletal: Normal range of motion.  Neurological: She is alert and oriented to person, place, and time.  Skin: Skin is warm and dry.  Psychiatric: She has a normal mood and affect. Her behavior is normal.    ED Course  Procedures (including critical care time) DIAGNOSTIC STUDIES: Oxygen Saturation is 95% on room air, normal by my interpretation.    COORDINATION OF CARE: At 2:27 PM: Discussed treatment plan with patient which includes CT head, labs. Patient agrees.   Labs Review Labs Reviewed  BASIC METABOLIC PANEL - Abnormal; Notable for the following:    Sodium 134 (*)    Chloride 91 (*)    GFR calc non Af Amer 85 (*)    All other components within normal limits  URINALYSIS, ROUTINE W REFLEX MICROSCOPIC - Abnormal; Notable for the following:    Specific Gravity, Urine <1.005 (*)    All other components within normal limits  CBC WITH DIFFERENTIAL  TROPONIN I    Imaging Review Ct Head Wo Contrast  07/06/2014   CLINICAL DATA:  Altered mental status, single episode of violence shaking reported by family lasting for 3-4 min, history diabetes, breast cancer 2007, hypertension  EXAM: CT HEAD WITHOUT CONTRAST  TECHNIQUE: Contiguous axial images were obtained from the base of the skull through the vertex without intravenous contrast.  COMPARISON:  None  FINDINGS: Generalized atrophy.  Normal ventricular morphology.  No midline shift or mass effect.  Small vessel chronic ischemic changes of deep cerebral white matter.  No  definite intracranial hemorrhage, mass lesion or evidence acute infarction.  No extra-axial fluid collections.  Bones demineralized.  Sinuses unremarkable.  IMPRESSION: Atrophy with small vessel chronic ischemic changes of deep cerebral white matter.  No acute intracranial abnormalities.   Electronically Signed   By: Lavonia Dana M.D.   On: 07/06/2014 14:06     EKG Interpretation   Date/Time:  Sunday July 06 2014 12:10:22 EDT Ventricular Rate:  84 PR Interval:  140 QRS Duration: 90 QT Interval:  380 QTC Calculation: 449 R Axis:   64 Text Interpretation:  Sinus rhythm Probable left atrial enlargement RSR'  in V1 or V2, right VCD or RVH Left ventricular hypertrophy Nonspecific T  abnormalities,  lateral leads Confirmed by Lacinda Axon  MD, Ruth ([id number]) on  07/06/2014 12:37:33 PM      MDM   Final diagnoses:  Altered mental status, unspecified altered mental status type   patient has totally normal physical exam in the emergency department. Although patient appeared to have tonic-clonic movements of her arms, there was no postictal state.  Uncertain etiology of symptoms.  CT head, EKG, labs, troponin, urinalysis all normal. Discussed findings with patient and her family. They have primary care followup.  I personally performed the services described in this documentation, which was scribed in my presence. The recorded information has been reviewed and is accurate.     Nat Christen, MD 07/06/14 [phone]

## 2014-07-09 ENCOUNTER — Ambulatory Visit (INDEPENDENT_AMBULATORY_CARE_PROVIDER_SITE_OTHER): Payer: Medicare Other | Admitting: General Surgery

## 2014-07-09 ENCOUNTER — Encounter (INDEPENDENT_AMBULATORY_CARE_PROVIDER_SITE_OTHER): Payer: Self-pay | Admitting: General Surgery

## 2014-07-09 VITALS — BP 112/56 | HR 82 | Temp 99.4°F | Resp 18 | Ht 64.0 in | Wt 127.6 lb

## 2014-07-09 DIAGNOSIS — K6289 Other specified diseases of anus and rectum: Secondary | ICD-10-CM

## 2014-07-09 MED ORDER — LIDOCAINE 5 % EX OINT: 1.0000 "application " | TOPICAL_OINTMENT | CUTANEOUS | Status: DC | PRN

## 2014-07-09 NOTE — Progress Notes (Signed)
Jacqueline Cervantes is a 79 y.o. female who is here for a follow up visit regarding her hemorrhoids. She is having anal pain and still has constipation.  She reports a sharp pain with bowel movements. She is taking a fiber supplement twice a day and states that she is drinking plenty of water. She denies any bleeding. She does feel that she has prolapsing tissue. She is using laxatives on a regular basis.   Objective:  Filed Vitals:   07/09/14 1527  BP: 112/56  Pulse: 82  Temp: 99.4 F (37.4 C)  Resp: 18   General appearance: alert and cooperative  Resp: clear to auscultation bilaterally  Cardio: regular rate and rhythm  Anal: slight sphincter htn, unable to do complete anoscopy due to pain.   Assessment and Plan:  Continue fiber and miralax 1-3 times and day for constipation. Use suppositories to decrease anal inflammation. Will give Rx for lidocaine ointment and counseled pt on sitz baths to help relieve pain. F/U PRN

## 2014-07-09 NOTE — Patient Instructions (Signed)
Cont fiber.  Use miralax to titrate to soft bowel movements.  Use suppositories, warm baths and lidocaine ointment to help with pain.

## 2014-07-11 ENCOUNTER — Ambulatory Visit (INDEPENDENT_AMBULATORY_CARE_PROVIDER_SITE_OTHER): Payer: Medicare Other | Admitting: Family

## 2014-07-11 ENCOUNTER — Encounter: Payer: Self-pay | Admitting: Family

## 2014-07-11 VITALS — BP 132/68 | HR 90 | Temp 98.1°F | Ht 64.0 in | Wt 127.0 lb

## 2014-07-11 DIAGNOSIS — F411 Generalized anxiety disorder: Secondary | ICD-10-CM | POA: Insufficient documentation

## 2014-07-11 DIAGNOSIS — R062 Wheezing: Secondary | ICD-10-CM

## 2014-07-11 DIAGNOSIS — Z720 Tobacco use: Secondary | ICD-10-CM

## 2014-07-11 DIAGNOSIS — I10 Essential (primary) hypertension: Secondary | ICD-10-CM

## 2014-07-11 DIAGNOSIS — E119 Type 2 diabetes mellitus without complications: Secondary | ICD-10-CM

## 2014-07-11 DIAGNOSIS — M810 Age-related osteoporosis without current pathological fracture: Secondary | ICD-10-CM

## 2014-07-11 DIAGNOSIS — F17200 Nicotine dependence, unspecified, uncomplicated: Secondary | ICD-10-CM

## 2014-07-11 DIAGNOSIS — E785 Hyperlipidemia, unspecified: Secondary | ICD-10-CM

## 2014-07-11 LAB — POCT GLYCOSYLATED HEMOGLOBIN (HGB A1C): Hemoglobin A1C: 5.6

## 2014-07-11 MED ORDER — ALPRAZOLAM 0.5 MG PO TABS: 0.5000 mg | ORAL_TABLET | Freq: Two times a day (BID) | ORAL | Status: DC

## 2014-07-11 MED ORDER — ATORVASTATIN CALCIUM 40 MG PO TABS: 40.0000 mg | ORAL_TABLET | Freq: Every day | ORAL | Status: DC

## 2014-07-11 MED ORDER — ALBUTEROL SULFATE HFA 108 (90 BASE) MCG/ACT IN AERS: 2.0000 | INHALATION_SPRAY | Freq: Four times a day (QID) | RESPIRATORY_TRACT | Status: DC | PRN

## 2014-07-11 MED ORDER — METFORMIN HCL 500 MG PO TABS: 500.0000 mg | ORAL_TABLET | Freq: Two times a day (BID) | ORAL | Status: DC

## 2014-07-11 MED ORDER — CITALOPRAM HYDROBROMIDE 20 MG PO TABS: 20.0000 mg | ORAL_TABLET | Freq: Every day | ORAL | Status: DC

## 2014-07-11 MED ORDER — AMLODIPINE BESYLATE 10 MG PO TABS: 10.0000 mg | ORAL_TABLET | Freq: Every day | ORAL | Status: DC

## 2014-07-11 MED ORDER — ALENDRONATE SODIUM 70 MG PO TABS: 70.0000 mg | ORAL_TABLET | ORAL | Status: DC

## 2014-07-11 MED ORDER — HYDROCHLOROTHIAZIDE 12.5 MG PO CAPS: 12.5000 mg | ORAL_CAPSULE | Freq: Every day | ORAL | Status: DC

## 2014-07-11 MED ORDER — ALBUTEROL SULFATE (2.5 MG/3ML) 0.083% IN NEBU: 2.5000 mg | INHALATION_SOLUTION | Freq: Four times a day (QID) | RESPIRATORY_TRACT | Status: DC | PRN

## 2014-07-11 MED ORDER — BUDESONIDE-FORMOTEROL FUMARATE 80-4.5 MCG/ACT IN AERO: 2.0000 | INHALATION_SPRAY | Freq: Two times a day (BID) | RESPIRATORY_TRACT | Status: DC

## 2014-07-11 MED ORDER — FENOFIBRATE 160 MG PO TABS: 160.0000 mg | ORAL_TABLET | Freq: Every day | ORAL | Status: DC

## 2014-07-11 MED ORDER — LISINOPRIL 40 MG PO TABS: 40.0000 mg | ORAL_TABLET | Freq: Every day | ORAL | Status: DC

## 2014-07-11 NOTE — Progress Notes (Signed)
Subjective:    Patient ID: Jacqueline Cervantes, female    DOB: [birth year], 79 y.o.   MRN: [id number]  Hyperlipidemia This is a chronic problem. The current episode started more than 1 year ago. The problem is controlled. Recent lipid tests were reviewed and are normal. Exacerbating diseases include diabetes. She has no history of hypothyroidism. Factors aggravating her hyperlipidemia include smoking. Associated symptoms include shortness of breath. Pertinent negatives include no focal sensory loss, leg pain or myalgias. Current antihyperlipidemic treatment includes statins and fibric acid derivatives. The current treatment provides moderate improvement of lipids. Compliance problems include adherence to exercise.  Risk factors for coronary artery disease include diabetes mellitus, dyslipidemia, hypertension and post-menopausal.  Hypertension This is a chronic problem. The current episode started more than 1 year ago. The problem has been resolved since onset. The problem is controlled. Associated symptoms include anxiety and shortness of breath. Pertinent negatives include no blurred vision, headaches, malaise/fatigue, palpitations or peripheral edema. Risk factors for coronary artery disease include diabetes mellitus, dyslipidemia, post-menopausal state and smoking/tobacco exposure. Past treatments include diuretics and ACE inhibitors. The current treatment provides moderate improvement. There is no history of kidney disease, CAD/MI, CVA, heart failure or a thyroid problem. There is no history of sleep apnea.  Diabetes She presents for her follow-up diabetic visit. She has type 2 diabetes mellitus. Her disease course has been improving. Pertinent negatives for hypoglycemia include no confusion, dizziness, headaches or nervousness/anxiousness. Pertinent negatives for diabetes include no blurred vision, no foot paresthesias, no foot ulcerations and no visual change. Symptoms are improving. Pertinent negatives  for diabetic complications include no CVA, heart disease or peripheral neuropathy. Risk factors for coronary artery disease include diabetes mellitus, dyslipidemia, hypertension, post-menopausal and tobacco exposure. Current diabetic treatment includes oral agent (monotherapy). She is compliant with treatment all of the time. She is following a generally healthy diet. An ACE inhibitor/angiotensin II receptor blocker is being taken. Eye exam is current.  Anxiety Presents for follow-up visit. Symptoms include shortness of breath. Patient reports no confusion, depressed mood, dizziness, excessive worry, insomnia, irritability, nervous/anxious behavior, palpitations or panic. Symptoms occur rarely. The severity of symptoms is mild. The quality of sleep is good.   Her past medical history is significant for anxiety/panic attacks. There is no history of depression. Past treatments include SSRIs and benzodiazephines. The treatment provided significant relief. Compliance with prior treatments has been good.  Osteoporosis Pt currently taking alendronate 70 mg weekly. Pt states she is due for Dexascan in Aug 2016.     Review of Systems  Constitutional: Negative.  Negative for malaise/fatigue and irritability.  HENT: Negative.   Eyes: Negative.  Negative for blurred vision.  Respiratory: Positive for shortness of breath.   Cardiovascular: Negative.  Negative for palpitations.  Gastrointestinal: Negative.   Endocrine: Negative.   Genitourinary: Negative.   Musculoskeletal: Negative.  Negative for myalgias.  Neurological: Negative.  Negative for dizziness and headaches.  Hematological: Negative.   Psychiatric/Behavioral: Negative.  Negative for confusion. The patient is not nervous/anxious and does not have insomnia.   All other systems reviewed and are negative.      Objective:   Physical Exam  Vitals reviewed. Constitutional: She is oriented to person, place, and time. She appears well-developed  and well-nourished. No distress.  HENT:  Head: Normocephalic and atraumatic.  Right Ear: External ear normal.  Mouth/Throat: Oropharynx is clear and moist.  Eyes: Pupils are equal, round, and reactive to light.  Neck: Normal range of motion. Neck supple.  No thyromegaly present.  Cardiovascular: Normal rate, regular rhythm, normal heart sounds and intact distal pulses.   No murmur heard. Pulmonary/Chest: Effort normal. No respiratory distress. She has wheezes.  Wheezes in bilateral lungs, nonproductive cough  Abdominal: Soft. Bowel sounds are normal. She exhibits no distension. There is no tenderness.  Musculoskeletal: Normal range of motion. She exhibits no edema and no tenderness.  Neurological: She is alert and oriented to person, place, and time. She has normal reflexes. No cranial nerve deficit.  Skin: Skin is warm and dry.  Psychiatric: She has a normal mood and affect. Her behavior is normal. Judgment and thought content normal.      BP 132/68  Pulse 90  Temp(Src) 98.1 F (36.7 C) (Oral)  Ht 5' 4" (1.626 m)  Wt 127 lb (57.607 kg)  BMI 21.79 kg/m2     Assessment & Plan:  1. Essential hypertension - CMP14+EGFR - amLODipine (NORVASC) 10 MG tablet; Take 1 tablet (10 mg total) by mouth daily.  Dispense: 90 tablet; Refill: 3 - hydrochlorothiazide (MICROZIDE) 12.5 MG capsule; Take 1 capsule (12.5 mg total) by mouth daily.  Dispense: 90 capsule; Refill: 3 - lisinopril (PRINIVIL,ZESTRIL) 40 MG tablet; Take 1 tablet (40 mg total) by mouth daily.  Dispense: 90 tablet; Refill: 2  2. Type 2 diabetes mellitus without complication - POCT glycosylated hemoglobin (Hb A1C) - metFORMIN (GLUCOPHAGE) 500 MG tablet; Take 1-2 tablets (500-1,000 mg total) by mouth 2 (two) times daily with a meal. Takes 1000 mg in the morning and 500 mg in the evening.  Dispense: 180 tablet; Refill: 3  3. Osteoporosis, unspecified - Vit D  25 hydroxy (rtn osteoporosis monitoring) - alendronate (FOSAMAX) 70 MG  tablet; Take 1 tablet (70 mg total) by mouth once a week. Take with a full glass of water on an empty stomach. Takes on Fridays  Dispense: 12 tablet; Refill: 3  4. HLD (hyperlipidemia) - Lipid panel - fenofibrate 160 MG tablet; Take 1 tablet (160 mg total) by mouth daily.  Dispense: 90 tablet; Refill: 3 - atorvastatin (LIPITOR) 40 MG tablet; Take 1 tablet (40 mg total) by mouth daily.  Dispense: 90 tablet; Refill: 3  5. Tobacco user   6. Generalized anxiety disorder - citalopram (CELEXA) 20 MG tablet; Take 1 tablet (20 mg total) by mouth daily.  Dispense: 90 tablet; Refill: 3 - ALPRAZolam (XANAX) 0.5 MG tablet; Take 1 tablet (0.5 mg total) by mouth 2 (two) times daily.  Dispense: 60 tablet; Refill: 1  7. Wheezing -Smoking cessation discussed - budesonide-formoterol (SYMBICORT) 80-4.5 MCG/ACT inhaler; Inhale 2 puffs into the lungs 2 (two) times daily.  Dispense: 1 Inhaler; Refill: 3 - albuterol (PROVENTIL) (2.5 MG/3ML) 0.083% nebulizer solution; Take 3 mLs (2.5 mg total) by nebulization every 6 (six) hours as needed for wheezing or shortness of breath.  Dispense: 75 mL; Refill: 12   Continue all meds Labs pending Health Maintenance reviewed Diet and exercise encouraged RTO 3 months  Evelina Dun, FNP

## 2014-07-11 NOTE — Patient Instructions (Signed)

## 2014-07-12 LAB — VITAMIN D 25 HYDROXY (VIT D DEFICIENCY, FRACTURES): VIT D 25 HYDROXY: 51.6 ng/mL (ref 30.0–100.0)

## 2014-07-12 LAB — CMP14+EGFR
A/G RATIO: 1.7 (ref 1.1–2.5)
ALK PHOS: 21 IU/L — AB (ref 39–117)
ALT: 13 IU/L (ref 0–32)
AST: 19 IU/L (ref 0–40)
Albumin: 4.5 g/dL (ref 3.5–4.8)
BUN / CREAT RATIO: 18 (ref 11–26)
BUN: 14 mg/dL (ref 8–27)
CO2: 27 mmol/L (ref 18–29)
CREATININE: 0.79 mg/dL (ref 0.57–1.00)
Calcium: 10.5 mg/dL — ABNORMAL HIGH (ref 8.7–10.3)
Chloride: 88 mmol/L — ABNORMAL LOW (ref 97–108)
GFR calc Af Amer: 84 mL/min/{1.73_m2} (ref >59)
GFR, EST NON AFRICAN AMERICAN: 73 mL/min/{1.73_m2} (ref >59)
Globulin, Total: 2.7 g/dL (ref 1.5–4.5)
Glucose: 78 mg/dL (ref 65–99)
Potassium: 5.1 mmol/L (ref 3.5–5.2)
Sodium: 133 mmol/L — ABNORMAL LOW (ref 134–144)
Total Bilirubin: 0.3 mg/dL (ref 0.0–1.2)
Total Protein: 7.2 g/dL (ref 6.0–8.5)

## 2014-07-12 LAB — LIPID PANEL
Chol/HDL Ratio: 2.2 ratio units (ref 0.0–4.4)
Cholesterol, Total: 115 mg/dL (ref 100–199)
HDL: 53 mg/dL (ref >39)
LDL Calculated: 39 mg/dL (ref 0–99)
Triglycerides: 116 mg/dL (ref 0–149)
VLDL Cholesterol Cal: 23 mg/dL (ref 5–40)

## 2014-07-15 ENCOUNTER — Encounter (INDEPENDENT_AMBULATORY_CARE_PROVIDER_SITE_OTHER): Payer: Medicare Other | Admitting: General Surgery

## 2014-07-24 ENCOUNTER — Other Ambulatory Visit: Payer: Self-pay | Admitting: Family Medicine

## 2014-07-31 ENCOUNTER — Other Ambulatory Visit: Payer: Self-pay | Admitting: Family Medicine

## 2014-08-21 ENCOUNTER — Other Ambulatory Visit: Payer: Self-pay | Admitting: *Deleted

## 2014-08-21 DIAGNOSIS — M810 Age-related osteoporosis without current pathological fracture: Secondary | ICD-10-CM

## 2014-09-16 ENCOUNTER — Other Ambulatory Visit: Payer: Self-pay | Admitting: Nurse Practitioner

## 2014-09-29 ENCOUNTER — Other Ambulatory Visit: Payer: Self-pay | Admitting: Family

## 2014-09-30 NOTE — Telephone Encounter (Signed)
Called in.

## 2014-09-30 NOTE — Telephone Encounter (Signed)
Last filled 09/01/14, last seen 07/11/14. Route to pool A, [facility]

## 2014-10-01 ENCOUNTER — Other Ambulatory Visit: Payer: Self-pay | Admitting: Family

## 2014-10-16 ENCOUNTER — Encounter: Payer: Self-pay | Admitting: Nurse Practitioner

## 2014-10-16 ENCOUNTER — Ambulatory Visit (INDEPENDENT_AMBULATORY_CARE_PROVIDER_SITE_OTHER): Payer: Medicare Other | Admitting: Nurse Practitioner

## 2014-10-16 VITALS — BP 130/77 | HR 90 | Temp 100.0°F

## 2014-10-16 DIAGNOSIS — E119 Type 2 diabetes mellitus without complications: Secondary | ICD-10-CM

## 2014-10-16 DIAGNOSIS — Z23 Encounter for immunization: Secondary | ICD-10-CM

## 2014-10-16 DIAGNOSIS — F411 Generalized anxiety disorder: Secondary | ICD-10-CM

## 2014-10-16 DIAGNOSIS — I10 Essential (primary) hypertension: Secondary | ICD-10-CM

## 2014-10-16 DIAGNOSIS — Z87891 Personal history of nicotine dependence: Secondary | ICD-10-CM

## 2014-10-16 DIAGNOSIS — J449 Chronic obstructive pulmonary disease, unspecified: Secondary | ICD-10-CM

## 2014-10-16 DIAGNOSIS — E785 Hyperlipidemia, unspecified: Secondary | ICD-10-CM

## 2014-10-16 LAB — POCT GLYCOSYLATED HEMOGLOBIN (HGB A1C): HEMOGLOBIN A1C: 5.6

## 2014-10-16 NOTE — Patient Instructions (Signed)

## 2014-10-16 NOTE — Progress Notes (Signed)
Subjective:    Patient ID: Jacqueline Cervantes, female    DOB: 12/06/1935, 79 y.o.   MRN: [id number]  Patient is here today for chronic visit follow up. No acute complaint.   Hyperlipidemia This is a chronic problem. The current episode started more than 1 year ago. The problem is controlled. Recent lipid tests were reviewed and are normal. Exacerbating diseases include diabetes. She has no history of hypothyroidism. Factors aggravating her hyperlipidemia include smoking. Associated symptoms include shortness of breath. Pertinent negatives include no focal sensory loss, leg pain or myalgias. Current antihyperlipidemic treatment includes statins and fibric acid derivatives. The current treatment provides moderate improvement of lipids. Compliance problems include adherence to exercise.  Risk factors for coronary artery disease include diabetes mellitus, dyslipidemia, hypertension and post-menopausal.  Hypertension This is a chronic problem. The current episode started more than 1 year ago. The problem has been resolved since onset. The problem is controlled. Associated symptoms include anxiety and shortness of breath. Pertinent negatives include no blurred vision, headaches, malaise/fatigue, palpitations or peripheral edema. Risk factors for coronary artery disease include diabetes mellitus, dyslipidemia, post-menopausal state and smoking/tobacco exposure. Past treatments include diuretics and ACE inhibitors. The current treatment provides moderate improvement. There is no history of kidney disease, CAD/MI, CVA, heart failure or a thyroid problem. There is no history of sleep apnea.  Diabetes She presents for her follow-up diabetic visit. She has type 2 diabetes mellitus. Her disease course has been improving. Pertinent negatives for hypoglycemia include no confusion, dizziness, headaches or nervousness/anxiousness. Pertinent negatives for diabetes include no blurred vision, no foot paresthesias, no foot  ulcerations and no visual change. Symptoms are improving. Pertinent negatives for diabetic complications include no CVA, heart disease or peripheral neuropathy. Risk factors for coronary artery disease include diabetes mellitus, dyslipidemia, hypertension, post-menopausal and tobacco exposure. Current diabetic treatment includes oral agent (monotherapy). She is compliant with treatment all of the time. She is following a generally healthy diet. An ACE inhibitor/angiotensin II receptor blocker is being taken. Eye exam is current.  Anxiety Presents for follow-up visit. Symptoms include shortness of breath. Patient reports no confusion, depressed mood, dizziness, excessive worry, insomnia, irritability, nervous/anxious behavior, palpitations or panic. Symptoms occur rarely. The severity of symptoms is mild. The quality of sleep is good.   Her past medical history is significant for anxiety/panic attacks. There is no history of depression. Past treatments include SSRIs and benzodiazephines. The treatment provided significant relief. Compliance with prior treatments has been good.  Osteoporosis Pt currently taking alendronate 70 mg weekly. Pt states she is due for Dexascan in Aug 2016.  COPD Uses neb machine one time a day along with symbicort- Patient still SMOKES  Review of Systems  Constitutional: Negative.  Negative for malaise/fatigue and irritability.  HENT: Negative.   Eyes: Negative.  Negative for blurred vision.  Respiratory: Positive for shortness of breath.   Cardiovascular: Negative.  Negative for palpitations.  Gastrointestinal: Negative.   Endocrine: Negative.   Genitourinary: Negative.   Musculoskeletal: Negative.  Negative for myalgias.  Neurological: Negative.  Negative for dizziness and headaches.  Hematological: Negative.   Psychiatric/Behavioral: Negative.  Negative for confusion. The patient is not nervous/anxious and does not have insomnia.   All other systems reviewed and are  negative.      Objective:   Physical Exam  Vitals reviewed. Constitutional: She is oriented to person, place, and time. She appears well-developed and well-nourished. No distress.  HENT:  Head: Normocephalic and atraumatic.  Right Ear: External ear  normal.  Mouth/Throat: Oropharynx is clear and moist.  Eyes: Pupils are equal, round, and reactive to light.  Neck: Normal range of motion. Neck supple. No thyromegaly present.  Cardiovascular: Normal rate, regular rhythm, normal heart sounds and intact distal pulses.   No murmur heard. Pulmonary/Chest: Effort normal. No respiratory distress.  Wheezes in bilateral lungs, nonproductive cough  Abdominal: Soft. Bowel sounds are normal. She exhibits no distension. There is no tenderness.  Musculoskeletal: Normal range of motion. She exhibits no edema and no tenderness.  Neurological: She is alert and oriented to person, place, and time. She has normal reflexes. No cranial nerve deficit.  Skin: Skin is warm and dry.  Psychiatric: She has a normal mood and affect. Her behavior is normal. Judgment and thought content normal.    BP 130/77  Pulse 90  Temp(Src) 100 F (37.8 C) (Oral)  Results for orders placed in visit on 10/16/14  POCT GLYCOSYLATED HEMOGLOBIN (HGB A1C)      Result Value Ref Range   Hemoglobin A1C 5.6         Assessment & Plan:   1. Type 2 diabetes mellitus without complication - POCT glycosylated hemoglobin (Hb A1C)  2. Former smoker Smokes once in a while  3. Generalized anxiety disorder Stress management  4. HLD (hyperlipidemia) - NMR, lipoprofile Low fat diet  5. Essential hypertension - CMP14+EGFR Low sodium diet 6. Encounter for immunization  7. COPD with chronic bronchitis STOP SMOKING!!!    Labs pending Health maintenance reviewed Diet and exercise encouraged Continue all meds Follow up  In 3 months   [place], FNP

## 2014-10-17 LAB — CMP14+EGFR
ALT: 12 IU/L (ref 0–32)
AST: 15 IU/L (ref 0–40)
Albumin/Globulin Ratio: 2 (ref 1.1–2.5)
Albumin: 4.5 g/dL (ref 3.5–4.8)
Alkaline Phosphatase: 20 IU/L — ABNORMAL LOW (ref 39–117)
BUN/Creatinine Ratio: 16 (ref 11–26)
BUN: 12 mg/dL (ref 8–27)
CALCIUM: 9.8 mg/dL (ref 8.7–10.3)
CHLORIDE: 89 mmol/L — AB (ref 97–108)
CO2: 29 mmol/L (ref 18–29)
Creatinine, Ser: 0.74 mg/dL (ref 0.57–1.00)
GFR calc Af Amer: 90 mL/min/{1.73_m2} (ref >59)
GFR calc non Af Amer: 78 mL/min/{1.73_m2} (ref >59)
GLUCOSE: 124 mg/dL — AB (ref 65–99)
Globulin, Total: 2.3 g/dL (ref 1.5–4.5)
POTASSIUM: 4.3 mmol/L (ref 3.5–5.2)
Sodium: 131 mmol/L — ABNORMAL LOW (ref 134–144)
TOTAL PROTEIN: 6.8 g/dL (ref 6.0–8.5)
Total Bilirubin: 0.2 mg/dL (ref 0.0–1.2)

## 2014-10-17 LAB — NMR, LIPOPROFILE
Cholesterol: 116 mg/dL (ref 100–199)
HDL CHOLESTEROL BY NMR: 49 mg/dL (ref >39)
HDL Particle Number: 38.9 umol/L (ref >=30.5)
LDL Particle Number: 718 nmol/L (ref <1000)
LDL SIZE: 20.4 nm (ref >20.5)
LDLC SERPL CALC-MCNC: 47 mg/dL (ref 0–99)
LP-IR Score: 56 — ABNORMAL HIGH (ref <=45)
Small LDL Particle Number: 493 nmol/L (ref <=527)
TRIGLYCERIDES BY NMR: 99 mg/dL (ref 0–149)

## 2014-10-27 ENCOUNTER — Encounter: Payer: Self-pay | Admitting: Nurse Practitioner

## 2014-11-30 ENCOUNTER — Other Ambulatory Visit: Payer: Self-pay | Admitting: Family

## 2014-12-01 NOTE — Telephone Encounter (Signed)
This prescription is okay 1

## 2014-12-01 NOTE — Telephone Encounter (Signed)
Last seen 10/16/14 MMM If approved route to nurse to call into [facility]

## 2014-12-01 NOTE — Telephone Encounter (Signed)
Please review and advise.

## 2014-12-02 ENCOUNTER — Other Ambulatory Visit: Payer: Self-pay | Admitting: Family

## 2014-12-02 ENCOUNTER — Telehealth: Payer: Self-pay | Admitting: Family Medicine

## 2014-12-02 NOTE — Telephone Encounter (Signed)
Called to confirm xanax refill.

## 2014-12-09 ENCOUNTER — Telehealth: Payer: Self-pay | Admitting: Hematology and Oncology

## 2014-12-09 NOTE — Telephone Encounter (Signed)
, °

## 2015-01-30 ENCOUNTER — Other Ambulatory Visit: Payer: Self-pay

## 2015-01-30 DIAGNOSIS — Z1231 Encounter for screening mammogram for malignant neoplasm of breast: Secondary | ICD-10-CM

## 2015-02-02 ENCOUNTER — Other Ambulatory Visit: Payer: Self-pay | Admitting: Family Medicine

## 2015-02-03 NOTE — Telephone Encounter (Signed)
Please call in xanax with 1 refills 

## 2015-02-03 NOTE — Telephone Encounter (Signed)
Last seen 10/16/14  MMM If approved route to nurse to call into [facility]

## 2015-02-05 NOTE — Telephone Encounter (Signed)
Left refill on voicemail

## 2015-02-13 ENCOUNTER — Encounter: Payer: Self-pay | Admitting: Nurse Practitioner

## 2015-02-13 ENCOUNTER — Ambulatory Visit (INDEPENDENT_AMBULATORY_CARE_PROVIDER_SITE_OTHER): Payer: Medicare Other | Admitting: Nurse Practitioner

## 2015-02-13 VITALS — BP 125/71 | HR 93 | Temp 97.9°F | Ht 64.0 in | Wt 132.0 lb

## 2015-02-13 DIAGNOSIS — E119 Type 2 diabetes mellitus without complications: Secondary | ICD-10-CM | POA: Diagnosis not present

## 2015-02-13 DIAGNOSIS — I10 Essential (primary) hypertension: Secondary | ICD-10-CM

## 2015-02-13 DIAGNOSIS — F411 Generalized anxiety disorder: Secondary | ICD-10-CM

## 2015-02-13 DIAGNOSIS — E785 Hyperlipidemia, unspecified: Secondary | ICD-10-CM

## 2015-02-13 DIAGNOSIS — J449 Chronic obstructive pulmonary disease, unspecified: Secondary | ICD-10-CM | POA: Diagnosis not present

## 2015-02-13 DIAGNOSIS — Z720 Tobacco use: Secondary | ICD-10-CM | POA: Diagnosis not present

## 2015-02-13 LAB — POCT GLYCOSYLATED HEMOGLOBIN (HGB A1C): Hemoglobin A1C: 5.6

## 2015-02-13 NOTE — Progress Notes (Signed)
Subjective:    Patient ID: Jacqueline Cervantes, female    DOB: 03-04-1935, 80 y.o.   MRN: [id number]  Patient is here today for chronic visit follow up. No acute complaint.   Hyperlipidemia This is a chronic problem. The current episode started more than 1 year ago. The problem is controlled. Recent lipid tests were reviewed and are normal. Exacerbating diseases include diabetes. She has no history of hypothyroidism or obesity. Pertinent negatives include no myalgias. Current antihyperlipidemic treatment includes statins and fibric acid derivatives. The current treatment provides moderate improvement of lipids. Compliance problems include adherence to diet and adherence to exercise.  Risk factors for coronary artery disease include diabetes mellitus, dyslipidemia, hypertension, post-menopausal and a sedentary lifestyle.  Hypertension This is a chronic problem. The current episode started more than 1 year ago. The problem is unchanged. The problem is controlled. Pertinent negatives include no headaches. Risk factors for coronary artery disease include smoking/tobacco exposure, diabetes mellitus, dyslipidemia, post-menopausal state and sedentary lifestyle. Past treatments include calcium channel blockers and ACE inhibitors. The current treatment provides moderate improvement. Compliance problems include diet and exercise.   Diabetes She presents for her follow-up diabetic visit. She has type 2 diabetes mellitus. No MedicAlert identification noted. Her disease course has been fluctuating. Pertinent negatives for hypoglycemia include no headaches. Pertinent negatives for diabetes include no visual change. There are no hypoglycemic complications. Symptoms are stable. There are no diabetic complications. Risk factors for coronary artery disease include diabetes mellitus, dyslipidemia, family history, hypertension, post-menopausal, sedentary lifestyle and tobacco exposure. Current diabetic treatment includes oral  agent (monotherapy). She is compliant with treatment most of the time. Her weight is stable. She is following a diabetic diet. When asked about meal planning, she reported none. She has not had a previous visit with a dietitian. She rarely participates in exercise. There is no change in her home blood glucose trend. Her breakfast blood glucose is taken between 8-9 am. Her breakfast blood glucose range is generally 110-130 mg/dl. Her overall blood glucose range is 110-130 mg/dl. An ACE inhibitor/angiotensin II receptor blocker is being taken. She does not see a podiatrist.Eye exam is not current.  Osteoporosis Pt currently taking alendronate 70 mg weekly. Pt states she is due for Dexascan in Aug 2016.  COPD Uses neb machine one time a day along with symbicort- Patient still SMOKES 2-3 x a day GAD Takes xanax usually just at night but occassionally needs during the day.    Review of Systems  Constitutional: Negative.   HENT: Negative.   Eyes: Negative.   Cardiovascular: Negative.   Gastrointestinal: Negative.   Endocrine: Negative.   Genitourinary: Negative.   Musculoskeletal: Negative.  Negative for myalgias.  Neurological: Negative.  Negative for headaches.  Hematological: Negative.   Psychiatric/Behavioral: Negative.   All other systems reviewed and are negative.      Objective:   Physical Exam  Constitutional: She is oriented to person, place, and time. She appears well-developed and well-nourished. No distress.  HENT:  Head: Normocephalic and atraumatic.  Right Ear: External ear normal.  Mouth/Throat: Oropharynx is clear and moist.  Eyes: Pupils are equal, round, and reactive to light.  Neck: Normal range of motion. Neck supple. No thyromegaly present.  Cardiovascular: Normal rate, regular rhythm, normal heart sounds and intact distal pulses.   No murmur heard. Pulmonary/Chest: Effort normal. No respiratory distress. She has wheezes (onsp and exp wheezes throughout).  Wheezes  in bilateral lungs, nonproductive cough  Abdominal: Soft. Bowel sounds are normal.  She exhibits no distension. There is no tenderness.  Musculoskeletal: Normal range of motion. She exhibits no edema or tenderness.  Neurological: She is alert and oriented to person, place, and time. She has normal reflexes. No cranial nerve deficit.  Skin: Skin is warm and dry.  Psychiatric: She has a normal mood and affect. Her behavior is normal. Judgment and thought content normal.  Vitals reviewed.   BP 125/71 mmHg  Pulse 93  Temp(Src) 97.9 F (36.6 C) (Oral)  Ht _0  (1.626 m)  Wt 132 lb (59.875 kg)  BMI 22.65 kg/m2  Results for orders placed or performed in visit on 02/13/15  POCT glycosylated hemoglobin (Hb A1C)  Result Value Ref Range   Hemoglobin A1C 5.6        Assessment & Plan:  1. Type 2 diabetes mellitus without complication Watch carbs - POCT glycosylated hemoglobin (Hb A1C)  2. HLD (hyperlipidemia) Low fat diet - NMR, lipoprofile  3. Essential hypertension Do not add slat to diet - CMP14+EGFR  4. COPD with chronic bronchitis STOP SMOKING  5. Tobacco user STOP SMOKING  6. Generalized anxiety disorder Stress management    Labs pending Health maintenance reviewed Diet and exercise encouraged Continue all meds Follow up  In 3 months   [place], FNP

## 2015-02-13 NOTE — Patient Instructions (Signed)

## 2015-02-15 LAB — CMP14+EGFR
A/G RATIO: 1.4 (ref 1.1–2.5)
ALT: 11 IU/L (ref 0–32)
AST: 20 IU/L (ref 0–40)
Albumin: 4.2 g/dL (ref 3.5–4.8)
Alkaline Phosphatase: 28 IU/L — ABNORMAL LOW (ref 39–117)
BUN/Creatinine Ratio: 18 (ref 11–26)
BUN: 14 mg/dL (ref 8–27)
Bilirubin Total: 0.3 mg/dL (ref 0.0–1.2)
CO2: 30 mmol/L — ABNORMAL HIGH (ref 18–29)
Calcium: 10.4 mg/dL — ABNORMAL HIGH (ref 8.7–10.3)
Chloride: 93 mmol/L — ABNORMAL LOW (ref 97–108)
Creatinine, Ser: 0.79 mg/dL (ref 0.57–1.00)
GFR calc Af Amer: 84 mL/min/{1.73_m2} (ref >59)
GFR, EST NON AFRICAN AMERICAN: 72 mL/min/{1.73_m2} (ref >59)
GLUCOSE: 91 mg/dL (ref 65–99)
Globulin, Total: 3 g/dL (ref 1.5–4.5)
Potassium: 4.9 mmol/L (ref 3.5–5.2)
Sodium: 140 mmol/L (ref 134–144)
Total Protein: 7.2 g/dL (ref 6.0–8.5)

## 2015-02-15 LAB — NMR, LIPOPROFILE
Cholesterol: 129 mg/dL (ref 100–199)
HDL Cholesterol by NMR: 42 mg/dL (ref >39)
HDL PARTICLE NUMBER: 32.2 umol/L (ref >=30.5)
LDL PARTICLE NUMBER: 822 nmol/L (ref <1000)
LDL Size: 19.9 nm (ref >20.5)
LDL-C: 47 mg/dL (ref 0–99)
LP-IR Score: 54 — ABNORMAL HIGH (ref <=45)
Small LDL Particle Number: 572 nmol/L — ABNORMAL HIGH (ref <=527)
Triglycerides by NMR: 199 mg/dL — ABNORMAL HIGH (ref 0–149)

## 2015-04-02 ENCOUNTER — Other Ambulatory Visit: Payer: Self-pay | Admitting: *Deleted

## 2015-04-02 DIAGNOSIS — C50912 Malignant neoplasm of unspecified site of left female breast: Secondary | ICD-10-CM

## 2015-04-06 ENCOUNTER — Other Ambulatory Visit (HOSPITAL_BASED_OUTPATIENT_CLINIC_OR_DEPARTMENT_OTHER): Payer: Medicare Other

## 2015-04-06 ENCOUNTER — Ambulatory Visit (HOSPITAL_BASED_OUTPATIENT_CLINIC_OR_DEPARTMENT_OTHER): Payer: Medicare Other | Admitting: Hematology and Oncology

## 2015-04-06 ENCOUNTER — Ambulatory Visit
Admission: RE | Admit: 2015-04-06 | Discharge: 2015-04-06 | Disposition: A | Discharge status: Home / Self Care | Payer: Medicare Other | Source: Ambulatory Visit

## 2015-04-06 VITALS — BP 124/84 | HR 93 | Temp 98.8°F | Resp 18 | Ht 64.0 in | Wt 127.7 lb

## 2015-04-06 DIAGNOSIS — C50912 Malignant neoplasm of unspecified site of left female breast: Secondary | ICD-10-CM

## 2015-04-06 DIAGNOSIS — M8580 Other specified disorders of bone density and structure, unspecified site: Secondary | ICD-10-CM | POA: Diagnosis not present

## 2015-04-06 DIAGNOSIS — Z853 Personal history of malignant neoplasm of breast: Secondary | ICD-10-CM | POA: Diagnosis not present

## 2015-04-06 DIAGNOSIS — Z1231 Encounter for screening mammogram for malignant neoplasm of breast: Secondary | ICD-10-CM

## 2015-04-06 LAB — CBC WITH DIFFERENTIAL/PLATELET
BASO%: 1.1 % (ref 0.0–2.0)
BASOS ABS: 0.1 10*3/uL (ref 0.0–0.1)
EOS%: 1.2 % (ref 0.0–7.0)
Eosinophils Absolute: 0.1 10*3/uL (ref 0.0–0.5)
HCT: 44.6 % (ref 34.8–46.6)
HGB: 14.4 g/dL (ref 11.6–15.9)
LYMPH#: 2.3 10*3/uL (ref 0.9–3.3)
LYMPH%: 28 % (ref 14.0–49.7)
MCH: 26.9 pg (ref 25.1–34.0)
MCHC: 32.3 g/dL (ref 31.5–36.0)
MCV: 83.5 fL (ref 79.5–101.0)
MONO#: 0.6 10*3/uL (ref 0.1–0.9)
MONO%: 7.8 % (ref 0.0–14.0)
NEUT%: 61.9 % (ref 38.4–76.8)
NEUTROS ABS: 5.1 10*3/uL (ref 1.5–6.5)
Platelets: 317 10*3/uL (ref 145–400)
RBC: 5.33 10*6/uL (ref 3.70–5.45)
RDW: 16.3 % — AB (ref 11.2–14.5)
WBC: 8.3 10*3/uL (ref 3.9–10.3)

## 2015-04-06 LAB — COMPREHENSIVE METABOLIC PANEL (CC13)
ALBUMIN: 4 g/dL (ref 3.5–5.0)
ALK PHOS: 26 U/L — AB (ref 40–150)
ALT: 10 U/L (ref 0–55)
AST: 13 U/L (ref 5–34)
Anion Gap: 9 mEq/L (ref 3–11)
BILIRUBIN TOTAL: 0.41 mg/dL (ref 0.20–1.20)
BUN: 12.8 mg/dL (ref 7.0–26.0)
CO2: 32 mEq/L — ABNORMAL HIGH (ref 22–29)
Calcium: 10.1 mg/dL (ref 8.4–10.4)
Chloride: 96 mEq/L — ABNORMAL LOW (ref 98–109)
Creatinine: 0.8 mg/dL (ref 0.6–1.1)
EGFR: 70 mL/min/{1.73_m2} — AB (ref >90)
Glucose: 115 mg/dl (ref 70–140)
Potassium: 4.1 mEq/L (ref 3.5–5.1)
SODIUM: 138 meq/L (ref 136–145)
TOTAL PROTEIN: 7.4 g/dL (ref 6.4–8.3)

## 2015-04-06 NOTE — Assessment & Plan Note (Signed)
Left breast invasive lobular cancer status post left mastectomy 05/11/2006 1.2 cm and 1.5 cm, 1/3 lymph nodes positive, ER 77%, PR 68%, HER-2 indeterminate, Ki-67 7%; second tumor ER 94%, PR 88%, HER-2/neu negative, Ki-67 15% started Arimidex in May 2007 completed May 2013  Breast cancer surveillance: 1. Breast exam 04/06/2015 is normal 2. Bone density showing osteopenia 3. Mammograms done today 04/06/2015  Return to clinic in one year to survivorship clinic

## 2015-04-06 NOTE — Progress Notes (Signed)
Patient Care Team: Mary-Margaret Martin, FNP as PCP - General (Nurse Practitioner)  DIAGNOSIS: Breast cancer, left breast   Staging form: Breast, AJCC 7th Edition     Clinical: No stage assigned - Unsigned     Pathologic: Stage IA (T1, N0, cM0) - Signed by Lindsey N Cornetto, NP on 03/02/2013  CHIEF COMPLIANT: Follow-up of breast cancer  INTERVAL HISTORY: Jacqueline Cervantes is a 80-year-old with above-mentioned history of left breast cancer diagnosed in 2007 and completed antiestrogen therapy as of 2013. She is currently on surveillance and observation. She reports no new problems or concerns. Denies any lumps or nodules.  REVIEW OF SYSTEMS:   Constitutional: Denies fevers, chills or abnormal weight loss Eyes: Denies blurriness of vision Ears, nose, mouth, throat, and face: Denies mucositis or sore throat Respiratory: shortness of breasts and wheezing after exertion Cardiovascular: Denies palpitation, chest discomfort or lower extremity swelling Gastrointestinal:  Denies nausea, heartburn or change in bowel habits Skin: Denies abnormal skin rashes Lymphatics: Denies new lymphadenopathy or easy bruising Neurological:Denies numbness, tingling or new weaknesses Behavioral/Psych: Mood is stable, no new changes  Breast:  denies any pain or lumps or nodules in either breasts All other systems were reviewed with the patient and are negative.  I have reviewed the past medical history, past surgical history, social history and family history with the patient and they are unchanged from previous note.  ALLERGIES:  has No Known Allergies.  MEDICATIONS:  Current Outpatient Prescriptions  Medication Sig Dispense Refill  . albuterol (PROVENTIL HFA;VENTOLIN HFA) 108 (90 BASE) MCG/ACT inhaler Inhale 2 puffs into the lungs every 6 (six) hours as needed for wheezing or shortness of breath. 1 Inhaler 3  . alendronate (FOSAMAX) 70 MG tablet Take 1 tablet (70 mg total) by mouth once a week. Take with a full  glass of water on an empty stomach. Takes on Fridays 12 tablet 3  . ALPRAZolam (XANAX) 0.5 MG tablet TAKE 1 TABLET BY MOUTH TWICE A DAY 60 tablet 1  . amLODipine (NORVASC) 10 MG tablet Take 1 tablet (10 mg total) by mouth daily. 90 tablet 3  . atorvastatin (LIPITOR) 40 MG tablet Take 1 tablet (40 mg total) by mouth daily. 90 tablet 3  . budesonide-formoterol (SYMBICORT) 80-4.5 MCG/ACT inhaler Inhale 2 puffs into the lungs 2 (two) times daily. 1 Inhaler 3  . Calcium Carbonate-Vitamin D (CALCIUM 600+D) 600-400 MG-UNIT per tablet Take 1 tablet by mouth daily.      . citalopram (CELEXA) 20 MG tablet TAKE 1 TABLET EVERY DAY 90 tablet 0  . fenofibrate 160 MG tablet Take 1 tablet (160 mg total) by mouth daily. 90 tablet 3  . hydrochlorothiazide (MICROZIDE) 12.5 MG capsule Take 1 capsule (12.5 mg total) by mouth daily. 90 capsule 3  . lisinopril (PRINIVIL,ZESTRIL) 40 MG tablet Take 1 tablet (40 mg total) by mouth daily. 90 tablet 2  . metFORMIN (GLUCOPHAGE) 500 MG tablet Take 1-2 tablets (500-1,000 mg total) by mouth 2 (two) times daily with a meal. Takes 1000 mg in the morning and 500 mg in the evening. 180 tablet 3  . Omega-3 Fatty Acids (FISH OIL PO) Take 1 capsule by mouth daily.    . polyethylene glycol (MIRALAX / GLYCOLAX) packet Take 17 g by mouth daily.    . tobramycin-dexamethasone (TOBRADEX) ophthalmic solution      No current facility-administered medications for this visit.    PHYSICAL EXAMINATION: ECOG PERFORMANCE STATUS: 0 - Asymptomatic  Filed Vitals:   04/06/15 1021  BP:   124/84  Pulse: 93  Temp: 98.8 F (37.1 C)  Resp: 18   Filed Weights   04/06/15 1021  Weight: 127 lb 11.2 oz (57.924 kg)    GENERAL:alert, no distress and comfortable SKIN: skin color, texture, turgor are normal, no rashes or significant lesions EYES: normal, Conjunctiva are pink and non-injected, sclera clear OROPHARYNX:no exudate, no erythema and lips, buccal mucosa, and tongue normal  NECK: supple,  thyroid normal size, non-tender, without nodularity LYMPH:  no palpable lymphadenopathy in the cervical, axillary or inguinal LUNGS: bilateral wheezing HEART: regular rate & rhythm and no murmurs and no lower extremity edema ABDOMEN:abdomen soft, non-tender and normal bowel sounds Musculoskeletal:no cyanosis of digits and no clubbing  NEURO: alert & oriented x 3 with fluent speech, no focal motor/sensory deficits BREAST: No palpable masses or nodules. Left breast mastectomy. No palpable axillary supraclavicular or infraclavicular adenopathy no breast tenderness or nipple discharge. (exam performed in the presence of a chaperone)  LABORATORY DATA:  I have reviewed the data as listed   Chemistry      Component Value Date/Time   NA 138 04/06/2015 1004   NA 140 02/13/2015 1332   NA 134* 07/06/2014 1240   K 4.1 04/06/2015 1004   K 4.9 02/13/2015 1332   CL 93* 02/13/2015 1332   CL 92* 02/28/2013 1240   CO2 32* 04/06/2015 1004   CO2 30* 02/13/2015 1332   BUN 12.8 04/06/2015 1004   BUN 14 02/13/2015 1332   BUN 13 07/06/2014 1240   CREATININE 0.8 04/06/2015 1004   CREATININE 0.79 02/13/2015 1332   CREATININE 0.76 05/09/2013 0911      Component Value Date/Time   CALCIUM 10.1 04/06/2015 1004   CALCIUM 10.4* 02/13/2015 1332   ALKPHOS 26* 04/06/2015 1004   ALKPHOS 28* 02/13/2015 1332   AST 13 04/06/2015 1004   AST 20 02/13/2015 1332   ALT 10 04/06/2015 1004   ALT 11 02/13/2015 1332   BILITOT 0.41 04/06/2015 1004   BILITOT 0.3 02/13/2015 1332   BILITOT 0.2 10/16/2014 1030       Lab Results  Component Value Date   WBC 8.3 04/06/2015   HGB 14.4 04/06/2015   HCT 44.6 04/06/2015   MCV 83.5 04/06/2015   PLT 317 04/06/2015   NEUTROABS 5.1 04/06/2015    ASSESSMENT & PLAN:  Breast cancer, left breast Left breast invasive lobular cancer status post left mastectomy 05/11/2006 1.2 cm and 1.5 cm, 1/3 lymph nodes positive, ER 77%, PR 68%, HER-2 indeterminate, Ki-67 7%; second tumor ER  94%, PR 88%, HER-2/neu negative, Ki-67 15% started Arimidex in May 2007 completed May 2013  Breast cancer surveillance: 1. Breast exam 04/06/2015 is normal 2. Bone density showing osteopenia 3. Mammograms done today 04/06/2015  Return to clinic in one year to survivorship clinic     No orders of the defined types were placed in this encounter.   The patient has a good understanding of the overall plan. she agrees with it. She will call with any problems that may develop before her next visit here.   Rulon Eisenmenger, MD

## 2015-04-07 ENCOUNTER — Other Ambulatory Visit: Payer: Self-pay | Admitting: Nurse Practitioner

## 2015-04-08 NOTE — Telephone Encounter (Signed)
Last filled 03/06/15, last seen 02/13/15. Call into [facility]

## 2015-04-08 NOTE — Telephone Encounter (Signed)
Please call in xanax with 1 refills 

## 2015-04-09 NOTE — Telephone Encounter (Signed)
rx called into pharmacy

## 2015-05-02 ENCOUNTER — Other Ambulatory Visit: Payer: Self-pay | Admitting: Family

## 2015-05-15 ENCOUNTER — Encounter: Payer: Self-pay | Admitting: Nurse Practitioner

## 2015-05-15 ENCOUNTER — Ambulatory Visit (INDEPENDENT_AMBULATORY_CARE_PROVIDER_SITE_OTHER): Payer: Medicare Other | Admitting: Nurse Practitioner

## 2015-05-15 VITALS — BP 134/73 | HR 91 | Temp 97.7°F | Ht 64.0 in | Wt 128.0 lb

## 2015-05-15 DIAGNOSIS — Z720 Tobacco use: Secondary | ICD-10-CM | POA: Diagnosis not present

## 2015-05-15 DIAGNOSIS — R062 Wheezing: Secondary | ICD-10-CM

## 2015-05-15 DIAGNOSIS — Z23 Encounter for immunization: Secondary | ICD-10-CM

## 2015-05-15 DIAGNOSIS — E785 Hyperlipidemia, unspecified: Secondary | ICD-10-CM | POA: Diagnosis not present

## 2015-05-15 DIAGNOSIS — J449 Chronic obstructive pulmonary disease, unspecified: Secondary | ICD-10-CM

## 2015-05-15 DIAGNOSIS — E119 Type 2 diabetes mellitus without complications: Secondary | ICD-10-CM

## 2015-05-15 DIAGNOSIS — I10 Essential (primary) hypertension: Secondary | ICD-10-CM

## 2015-05-15 DIAGNOSIS — F411 Generalized anxiety disorder: Secondary | ICD-10-CM

## 2015-05-15 LAB — POCT GLYCOSYLATED HEMOGLOBIN (HGB A1C): Hemoglobin A1C: 5.6

## 2015-05-15 MED ORDER — METFORMIN HCL 500 MG PO TABS: 500.0000 mg | ORAL_TABLET | Freq: Two times a day (BID) | ORAL | Status: DC

## 2015-05-15 MED ORDER — HYDROCHLOROTHIAZIDE 12.5 MG PO CAPS: 12.5000 mg | ORAL_CAPSULE | Freq: Every day | ORAL | Status: DC

## 2015-05-15 MED ORDER — ALPRAZOLAM 0.5 MG PO TABS: 0.5000 mg | ORAL_TABLET | Freq: Two times a day (BID) | ORAL | Status: DC

## 2015-05-15 MED ORDER — ATORVASTATIN CALCIUM 40 MG PO TABS: 40.0000 mg | ORAL_TABLET | Freq: Every day | ORAL | Status: DC

## 2015-05-15 MED ORDER — FENOFIBRATE 160 MG PO TABS: 160.0000 mg | ORAL_TABLET | Freq: Every day | ORAL | Status: DC

## 2015-05-15 MED ORDER — AMLODIPINE BESYLATE 10 MG PO TABS: 10.0000 mg | ORAL_TABLET | Freq: Every day | ORAL | Status: DC

## 2015-05-15 MED ORDER — BUDESONIDE-FORMOTEROL FUMARATE 80-4.5 MCG/ACT IN AERO: 2.0000 | INHALATION_SPRAY | Freq: Two times a day (BID) | RESPIRATORY_TRACT | Status: DC

## 2015-05-15 NOTE — Patient Instructions (Signed)

## 2015-05-15 NOTE — Addendum Note (Signed)
Addended by: Rolena Infante on: 05/15/2015 05:11 PM   Modules accepted: Orders

## 2015-05-15 NOTE — Addendum Note (Signed)
Addended by: [name] on: 05/15/2015 02:31 PM   Modules accepted: Orders

## 2015-05-15 NOTE — Progress Notes (Signed)
Subjective:    Patient ID: Jacqueline Cervantes, female    DOB: [birth year], 80 y.o.   MRN: [id number]  Patient is here today for chronic visit follow up. No acute complaint.   Hyperlipidemia This is a chronic problem. The current episode started more than 1 year ago. The problem is controlled. Recent lipid tests were reviewed and are normal. Exacerbating diseases include diabetes. She has no history of hypothyroidism or obesity. Pertinent negatives include no myalgias. Current antihyperlipidemic treatment includes statins and fibric acid derivatives. The current treatment provides moderate improvement of lipids. Compliance problems include adherence to diet and adherence to exercise.  Risk factors for coronary artery disease include diabetes mellitus, dyslipidemia, hypertension, post-menopausal and a sedentary lifestyle.  Hypertension This is a chronic problem. The current episode started more than 1 year ago. The problem is unchanged. The problem is controlled. Pertinent negatives include no headaches. Risk factors for coronary artery disease include smoking/tobacco exposure, diabetes mellitus, dyslipidemia, post-menopausal state and sedentary lifestyle. Past treatments include calcium channel blockers and ACE inhibitors. The current treatment provides moderate improvement. Compliance problems include diet and exercise.   Diabetes She presents for her follow-up diabetic visit. She has type 2 diabetes mellitus. No MedicAlert identification noted. Her disease course has been fluctuating. Pertinent negatives for hypoglycemia include no headaches. Pertinent negatives for diabetes include no visual change. There are no hypoglycemic complications. Symptoms are stable. There are no diabetic complications. Risk factors for coronary artery disease include diabetes mellitus, dyslipidemia, family history, hypertension, post-menopausal, sedentary lifestyle and tobacco exposure. Current diabetic treatment includes oral  agent (monotherapy). She is compliant with treatment most of the time. Her weight is stable. She is following a diabetic diet. When asked about meal planning, she reported none. She has not had a previous visit with a dietitian. She rarely participates in exercise. There is no change in her home blood glucose trend. Her breakfast blood glucose is taken between 8-9 am. Her breakfast blood glucose range is generally 110-130 mg/dl. Her overall blood glucose range is 110-130 mg/dl. An ACE inhibitor/angiotensin II receptor blocker is being taken. She does not see a podiatrist.Eye exam is not current.  Osteoporosis Pt currently taking alendronate 70 mg weekly. Pt states she is due for Dexascan in Aug 2016.  COPD Uses neb machine one time a day along with symbicort- Patient still SMOKES 2-3 x a day GAD Takes xanax usually just at night but occassionally needs during the day.    Review of Systems  Constitutional: Negative.   HENT: Negative.   Eyes: Negative.   Cardiovascular: Negative.   Gastrointestinal: Negative.   Endocrine: Negative.   Genitourinary: Negative.   Musculoskeletal: Negative.  Negative for myalgias.  Neurological: Negative.  Negative for headaches.  Hematological: Negative.   Psychiatric/Behavioral: Negative.   All other systems reviewed and are negative.      Objective:   Physical Exam  Constitutional: She is oriented to person, place, and time. She appears well-developed and well-nourished. No distress.  HENT:  Head: Normocephalic and atraumatic.  Right Ear: External ear normal.  Mouth/Throat: Oropharynx is clear and moist.  Eyes: Pupils are equal, round, and reactive to light.  Neck: Normal range of motion. Neck supple. No thyromegaly present.  Cardiovascular: Normal rate, regular rhythm and intact distal pulses.   Murmur (1/6 systolic murmur) heard. Pulmonary/Chest: Effort normal. No respiratory distress. She has wheezes (onsp and exp wheezes throughout).  Wheezes  in bilateral lungs, nonproductive cough  Abdominal: Soft. Bowel sounds are normal. She  exhibits no distension. There is no tenderness.  Musculoskeletal: Normal range of motion. She exhibits no edema or tenderness.  Neurological: She is alert and oriented to person, place, and time. She has normal reflexes. No cranial nerve deficit.  Skin: Skin is warm and dry.  Psychiatric: She has a normal mood and affect. Her behavior is normal. Judgment and thought content normal.  Vitals reviewed.   BP 134/73 mmHg  Pulse 91  Temp(Src) 97.7 F (36.5 C) (Oral)  Ht _0  (1.626 m)  Wt 128 lb (58.06 kg)  BMI 21.96 kg/m2  Results for orders placed or performed in visit on 05/15/15  POCT glycosylated hemoglobin (Hb A1C)  Result Value Ref Range   Hemoglobin A1C 5.6        Assessment & Plan:    1. Type 2 diabetes mellitus without complication Continue to watch carbs in diet - POCT glycosylated hemoglobin (Hb A1C) - metFORMIN (GLUCOPHAGE) 500 MG tablet; Take 1-2 tablets (500-1,000 mg total) by mouth 2 (two) times daily with a meal. Takes 1000 mg in the morning and 500 mg in the evening.  Dispense: 180 tablet; Refill: 3  2. Essential hypertension Do not add salt to diet - CMP14+EGFR - NMR, lipoprofile - amLODipine (NORVASC) 10 MG tablet; Take 1 tablet (10 mg total) by mouth daily.  Dispense: 90 tablet; Refill: 3 - hydrochlorothiazide (MICROZIDE) 12.5 MG capsule; Take 1 capsule (12.5 mg total) by mouth daily.  Dispense: 90 capsule; Refill: 3  3. COPD with chronic bronchitis Continue inhalers  4. Tobacco user Smoking cessation encouraged  5. HLD (hyperlipidemia) Low fat diet - fenofibrate 160 MG tablet; Take 1 tablet (160 mg total) by mouth daily.  Dispense: 90 tablet; Refill: 3 - atorvastatin (LIPITOR) 40 MG tablet; Take 1 tablet (40 mg total) by mouth daily.  Dispense: 90 tablet; Refill: 3  6. Generalized anxiety disorder Stress management  7. Wheezing - budesonide-formoterol  (SYMBICORT) 80-4.5 MCG/ACT inhaler; Inhale 2 puffs into the lungs 2 (two) times daily.  Dispense: 1 Inhaler; Refill: 3   prevnar 13 today Labs pending Health maintenance reviewed Diet and exercise encouraged Continue all meds Follow up  In 3 month   [place], FNP

## 2015-05-16 LAB — NMR, LIPOPROFILE
CHOLESTEROL: 129 mg/dL (ref 100–199)
HDL CHOLESTEROL BY NMR: 48 mg/dL (ref >39)
HDL Particle Number: 38.9 umol/L (ref >=30.5)
LDL Particle Number: 883 nmol/L (ref <1000)
LDL Size: 19.7 nm (ref >20.5)
LDL-C: 52 mg/dL (ref 0–99)
LP-IR Score: 55 — ABNORMAL HIGH (ref <=45)
SMALL LDL PARTICLE NUMBER: 739 nmol/L — AB (ref <=527)
Triglycerides by NMR: 147 mg/dL (ref 0–149)

## 2015-05-16 LAB — CMP14+EGFR
ALT: 10 IU/L (ref 0–32)
AST: 15 IU/L (ref 0–40)
Albumin/Globulin Ratio: 1.9 (ref 1.1–2.5)
Albumin: 4.6 g/dL (ref 3.5–4.8)
Alkaline Phosphatase: 21 IU/L — ABNORMAL LOW (ref 39–117)
BUN/Creatinine Ratio: 22 (ref 11–26)
BUN: 17 mg/dL (ref 8–27)
Bilirubin Total: 0.2 mg/dL (ref 0.0–1.2)
CO2: 30 mmol/L — AB (ref 18–29)
CREATININE: 0.78 mg/dL (ref 0.57–1.00)
Calcium: 10.5 mg/dL — ABNORMAL HIGH (ref 8.7–10.3)
Chloride: 90 mmol/L — ABNORMAL LOW (ref 97–108)
GFR calc Af Amer: 85 mL/min/{1.73_m2} (ref >59)
GFR, EST NON AFRICAN AMERICAN: 74 mL/min/{1.73_m2} (ref >59)
Globulin, Total: 2.4 g/dL (ref 1.5–4.5)
Glucose: 158 mg/dL — ABNORMAL HIGH (ref 65–99)
POTASSIUM: 4 mmol/L (ref 3.5–5.2)
Sodium: 135 mmol/L (ref 134–144)
Total Protein: 7 g/dL (ref 6.0–8.5)

## 2015-06-05 ENCOUNTER — Encounter: Payer: Self-pay | Admitting: *Deleted

## 2015-06-09 DIAGNOSIS — L570 Actinic keratosis: Secondary | ICD-10-CM | POA: Diagnosis not present

## 2015-07-21 ENCOUNTER — Other Ambulatory Visit: Payer: Self-pay | Admitting: Nurse Practitioner

## 2015-07-21 DIAGNOSIS — Z780 Asymptomatic menopausal state: Secondary | ICD-10-CM

## 2015-07-28 ENCOUNTER — Other Ambulatory Visit: Payer: Self-pay | Admitting: Family

## 2015-08-07 ENCOUNTER — Other Ambulatory Visit: Payer: Self-pay | Admitting: Nurse Practitioner

## 2015-08-07 NOTE — Telephone Encounter (Signed)
Last seen 05/15/15  MMM If approved route to nurse to call into [facility]

## 2015-08-07 NOTE — Telephone Encounter (Signed)
Please call in xanax with 1 refills 

## 2015-08-07 NOTE — Telephone Encounter (Signed)
rx called into pharmacy

## 2015-08-19 ENCOUNTER — Ambulatory Visit: Payer: Medicare Other | Admitting: Nurse Practitioner

## 2015-08-20 ENCOUNTER — Ambulatory Visit (INDEPENDENT_AMBULATORY_CARE_PROVIDER_SITE_OTHER): Payer: Medicare Other

## 2015-08-20 ENCOUNTER — Encounter: Payer: Self-pay | Admitting: Nurse Practitioner

## 2015-08-20 ENCOUNTER — Ambulatory Visit (INDEPENDENT_AMBULATORY_CARE_PROVIDER_SITE_OTHER): Payer: Medicare Other | Admitting: Nurse Practitioner

## 2015-08-20 VITALS — BP 115/59 | HR 83 | Temp 97.6°F | Ht 64.0 in | Wt 129.0 lb

## 2015-08-20 DIAGNOSIS — E785 Hyperlipidemia, unspecified: Secondary | ICD-10-CM | POA: Diagnosis not present

## 2015-08-20 DIAGNOSIS — Z720 Tobacco use: Secondary | ICD-10-CM

## 2015-08-20 DIAGNOSIS — J449 Chronic obstructive pulmonary disease, unspecified: Secondary | ICD-10-CM

## 2015-08-20 DIAGNOSIS — E119 Type 2 diabetes mellitus without complications: Secondary | ICD-10-CM

## 2015-08-20 DIAGNOSIS — Z780 Asymptomatic menopausal state: Secondary | ICD-10-CM

## 2015-08-20 DIAGNOSIS — I10 Essential (primary) hypertension: Secondary | ICD-10-CM

## 2015-08-20 DIAGNOSIS — F411 Generalized anxiety disorder: Secondary | ICD-10-CM | POA: Diagnosis not present

## 2015-08-20 DIAGNOSIS — M810 Age-related osteoporosis without current pathological fracture: Secondary | ICD-10-CM

## 2015-08-20 DIAGNOSIS — R062 Wheezing: Secondary | ICD-10-CM

## 2015-08-20 LAB — POCT GLYCOSYLATED HEMOGLOBIN (HGB A1C): HEMOGLOBIN A1C: 5.8

## 2015-08-20 LAB — POCT UA - MICROALBUMIN: Microalbumin Ur, POC: NEGATIVE mg/L

## 2015-08-20 MED ORDER — BUDESONIDE-FORMOTEROL FUMARATE 80-4.5 MCG/ACT IN AERO: 2.0000 | INHALATION_SPRAY | Freq: Two times a day (BID) | RESPIRATORY_TRACT | Status: DC

## 2015-08-20 MED ORDER — HYDROCHLOROTHIAZIDE 12.5 MG PO CAPS: 12.5000 mg | ORAL_CAPSULE | Freq: Every day | ORAL | Status: DC

## 2015-08-20 NOTE — Progress Notes (Signed)
Subjective:    Patient ID: Jacqueline Cervantes, female    DOB: [birth year], 80 y.o.   MRN: [id number]  Patient is here today for chronic visit follow up. No acute complaint.   Hyperlipidemia This is a chronic problem. The current episode started more than 1 year ago. The problem is controlled. Recent lipid tests were reviewed and are normal. Exacerbating diseases include diabetes. She has no history of hypothyroidism or obesity. Pertinent negatives include no myalgias. Current antihyperlipidemic treatment includes statins and fibric acid derivatives. The current treatment provides moderate improvement of lipids. Compliance problems include adherence to diet and adherence to exercise.  Risk factors for coronary artery disease include diabetes mellitus, dyslipidemia, hypertension, post-menopausal and a sedentary lifestyle.  Hypertension This is a chronic problem. The current episode started more than 1 year ago. The problem is unchanged. The problem is controlled. Pertinent negatives include no headaches. Risk factors for coronary artery disease include smoking/tobacco exposure, diabetes mellitus, dyslipidemia, post-menopausal state and sedentary lifestyle. Past treatments include calcium channel blockers and ACE inhibitors. The current treatment provides moderate improvement. Compliance problems include diet and exercise.   Diabetes She presents for her follow-up diabetic visit. She has type 2 diabetes mellitus. No MedicAlert identification noted. Her disease course has been fluctuating. Pertinent negatives for hypoglycemia include no headaches. Pertinent negatives for diabetes include no visual change. There are no hypoglycemic complications. Symptoms are stable. There are no diabetic complications. Risk factors for coronary artery disease include diabetes mellitus, dyslipidemia, family history, hypertension, post-menopausal, sedentary lifestyle and tobacco exposure. Current diabetic treatment includes oral  agent (monotherapy). She is compliant with treatment most of the time. Her weight is stable. She is following a diabetic diet. When asked about meal planning, she reported none. She has not had a previous visit with a dietitian. She rarely participates in exercise. There is no change in her home blood glucose trend. Her breakfast blood glucose is taken between 8-9 am. Her breakfast blood glucose range is generally 110-130 mg/dl. Her overall blood glucose range is 110-130 mg/dl. An ACE inhibitor/angiotensin II receptor blocker is being taken. She does not see a podiatrist.Eye exam is not current.  Osteoporosis Pt currently taking alendronate 70 mg weekly. Pt states she is due for Dexascan in Aug 2016.  COPD Uses neb machine one time a day along with symbicort- Patient still SMOKES 2-3 x a day GAD Takes xanax usually just at night but occassionally needs during the day.    Review of Systems  Constitutional: Negative.   HENT: Negative.   Eyes: Negative.   Cardiovascular: Negative.   Gastrointestinal: Negative.   Endocrine: Negative.   Genitourinary: Negative.   Musculoskeletal: Negative.  Negative for myalgias.  Neurological: Negative.  Negative for headaches.  Hematological: Negative.   Psychiatric/Behavioral: Negative.   All other systems reviewed and are negative.      Objective:   Physical Exam  Constitutional: She is oriented to person, place, and time. She appears well-developed and well-nourished. No distress.  HENT:  Head: Normocephalic and atraumatic.  Right Ear: External ear normal.  Mouth/Throat: Oropharynx is clear and moist.  Eyes: Pupils are equal, round, and reactive to light.  Neck: Normal range of motion. Neck supple. No thyromegaly present.  Cardiovascular: Normal rate, regular rhythm and intact distal pulses.   Murmur (1/6 systolic murmur) heard. Pulmonary/Chest: Effort normal. No respiratory distress. She has wheezes (onsp and exp wheezes throughout).  Wheezes  in bilateral lungs, nonproductive cough  Abdominal: Soft. Bowel sounds are normal. She  exhibits no distension. There is no tenderness.  Musculoskeletal: Normal range of motion. She exhibits no edema or tenderness.  Neurological: She is alert and oriented to person, place, and time. She has normal reflexes. No cranial nerve deficit.  Skin: Skin is warm and dry.  Psychiatric: She has a normal mood and affect. Her behavior is normal. Judgment and thought content normal.  Vitals reviewed.   BP 115/59 mmHg  Pulse 83  Temp(Src) 97.6 F (36.4 C) (Oral)  Ht _0  (1.626 m)  Wt 129 lb (58.514 kg)  BMI 22.13 kg/m2  Results for orders placed or performed in visit on 08/20/15  POCT glycosylated hemoglobin (Hb A1C)  Result Value Ref Range   Hemoglobin A1C 5.8   POCT UA - Microalbumin  Result Value Ref Range   Microalbumin Ur, POC negative mg/L       Assessment & Plan:    1. Type 2 diabetes mellitus without complication Continue to watch carbs in diet - POCT glycosylated hemoglobin (Hb A1C) - metFORMIN (GLUCOPHAGE) 500 MG tablet; Take 1-2 tablets (500-1,000 mg total) by mouth 2 (two) times daily with a meal. Takes 1000 mg in the morning and 500 mg in the evening.  Dispense: 180 tablet; Refill: 3  2. Essential hypertension Do not add salt to diet - CMP14+EGFR - NMR, lipoprofile - amLODipine (NORVASC) 10 MG tablet; Take 1 tablet (10 mg total) by mouth daily.  Dispense: 90 tablet; Refill: 3 - hydrochlorothiazide (MICROZIDE) 12.5 MG capsule; Take 1 capsule (12.5 mg total) by mouth daily.  Dispense: 90 capsule; Refill: 3  3. COPD with chronic bronchitis Continue inhalers  4. Tobacco user Smoking cessation encouraged  5. HLD (hyperlipidemia) Low fat diet - fenofibrate 160 MG tablet; Take 1 tablet (160 mg total) by mouth daily.  Dispense: 90 tablet; Refill: 3 - atorvastatin (LIPITOR) 40 MG tablet; Take 1 tablet (40 mg total) by mouth daily.  Dispense: 90 tablet; Refill: 3  6.  Generalized anxiety disorder Stress management  7. Wheezing - budesonide-formoterol (SYMBICORT) 80-4.5 MCG/ACT inhaler; Inhale 2 puffs into the lungs 2 (two) times daily.  Dispense: 1 Inhaler; Refill: 3   Patient to schedule eye exam and mammogram Labs pending Health maintenance reviewed Diet and exercise encouraged Continue all meds Follow up  In 3 month   [place], FNP

## 2015-08-20 NOTE — Patient Instructions (Signed)

## 2015-08-21 LAB — LIPID PANEL
CHOL/HDL RATIO: 2.3 ratio (ref 0.0–4.4)
Cholesterol, Total: 117 mg/dL (ref 100–199)
HDL: 50 mg/dL (ref >39)
LDL Calculated: 45 mg/dL (ref 0–99)
Triglycerides: 110 mg/dL (ref 0–149)
VLDL Cholesterol Cal: 22 mg/dL (ref 5–40)

## 2015-08-21 LAB — CMP14+EGFR
A/G RATIO: 1.6 (ref 1.1–2.5)
ALBUMIN: 4.4 g/dL (ref 3.5–4.8)
ALT: 13 IU/L (ref 0–32)
AST: 14 IU/L (ref 0–40)
Alkaline Phosphatase: 22 IU/L — ABNORMAL LOW (ref 39–117)
BUN / CREAT RATIO: 21 (ref 11–26)
BUN: 14 mg/dL (ref 8–27)
Bilirubin Total: 0.3 mg/dL (ref 0.0–1.2)
CALCIUM: 9.9 mg/dL (ref 8.7–10.3)
CO2: 29 mmol/L (ref 18–29)
Chloride: 86 mmol/L — ABNORMAL LOW (ref 97–108)
Creatinine, Ser: 0.68 mg/dL (ref 0.57–1.00)
GFR calc Af Amer: 98 mL/min/{1.73_m2} (ref >59)
GFR, EST NON AFRICAN AMERICAN: 85 mL/min/{1.73_m2} (ref >59)
GLOBULIN, TOTAL: 2.7 g/dL (ref 1.5–4.5)
Glucose: 111 mg/dL — ABNORMAL HIGH (ref 65–99)
Potassium: 4.8 mmol/L (ref 3.5–5.2)
SODIUM: 127 mmol/L — AB (ref 134–144)
Total Protein: 7.1 g/dL (ref 6.0–8.5)

## 2015-09-03 ENCOUNTER — Emergency Department (HOSPITAL_COMMUNITY): Payer: Medicare Other

## 2015-09-03 ENCOUNTER — Encounter: Payer: Self-pay | Admitting: Nurse Practitioner

## 2015-09-03 ENCOUNTER — Inpatient Hospital Stay (HOSPITAL_COMMUNITY)
Admission: EM | Admit: 2015-09-03 | Discharge: 2015-09-05 | DRG: 190 | Disposition: A | Discharge status: Home / Self Care | Payer: Medicare Other | Attending: Internal Medicine | Admitting: Internal Medicine

## 2015-09-03 ENCOUNTER — Ambulatory Visit (INDEPENDENT_AMBULATORY_CARE_PROVIDER_SITE_OTHER): Payer: Medicare Other | Admitting: Nurse Practitioner

## 2015-09-03 ENCOUNTER — Encounter (HOSPITAL_COMMUNITY): Payer: Self-pay | Admitting: Emergency Medicine

## 2015-09-03 VITALS — BP 133/75 | HR 91 | Temp 97.8°F | Resp 0 | Ht 64.0 in | Wt 128.0 lb

## 2015-09-03 DIAGNOSIS — Z9010 Acquired absence of unspecified breast and nipple: Secondary | ICD-10-CM | POA: Diagnosis present

## 2015-09-03 DIAGNOSIS — E222 Syndrome of inappropriate secretion of antidiuretic hormone: Secondary | ICD-10-CM | POA: Diagnosis not present

## 2015-09-03 DIAGNOSIS — K219 Gastro-esophageal reflux disease without esophagitis: Secondary | ICD-10-CM | POA: Diagnosis present

## 2015-09-03 DIAGNOSIS — Z720 Tobacco use: Secondary | ICD-10-CM | POA: Diagnosis not present

## 2015-09-03 DIAGNOSIS — Z79899 Other long term (current) drug therapy: Secondary | ICD-10-CM

## 2015-09-03 DIAGNOSIS — M1990 Unspecified osteoarthritis, unspecified site: Secondary | ICD-10-CM | POA: Diagnosis not present

## 2015-09-03 DIAGNOSIS — E780 Pure hypercholesterolemia: Secondary | ICD-10-CM | POA: Diagnosis present

## 2015-09-03 DIAGNOSIS — Z66 Do not resuscitate: Secondary | ICD-10-CM | POA: Diagnosis present

## 2015-09-03 DIAGNOSIS — E785 Hyperlipidemia, unspecified: Secondary | ICD-10-CM | POA: Diagnosis present

## 2015-09-03 DIAGNOSIS — E089 Diabetes mellitus due to underlying condition without complications: Secondary | ICD-10-CM

## 2015-09-03 DIAGNOSIS — F17201 Nicotine dependence, unspecified, in remission: Secondary | ICD-10-CM | POA: Diagnosis present

## 2015-09-03 DIAGNOSIS — Z23 Encounter for immunization: Secondary | ICD-10-CM

## 2015-09-03 DIAGNOSIS — F17210 Nicotine dependence, cigarettes, uncomplicated: Secondary | ICD-10-CM | POA: Diagnosis present

## 2015-09-03 DIAGNOSIS — I10 Essential (primary) hypertension: Secondary | ICD-10-CM | POA: Diagnosis not present

## 2015-09-03 DIAGNOSIS — Z85828 Personal history of other malignant neoplasm of skin: Secondary | ICD-10-CM

## 2015-09-03 DIAGNOSIS — J449 Chronic obstructive pulmonary disease, unspecified: Secondary | ICD-10-CM | POA: Diagnosis not present

## 2015-09-03 DIAGNOSIS — R7981 Abnormal blood-gas level: Secondary | ICD-10-CM

## 2015-09-03 DIAGNOSIS — J441 Chronic obstructive pulmonary disease with (acute) exacerbation: Secondary | ICD-10-CM | POA: Diagnosis not present

## 2015-09-03 DIAGNOSIS — F329 Major depressive disorder, single episode, unspecified: Secondary | ICD-10-CM | POA: Diagnosis present

## 2015-09-03 DIAGNOSIS — M810 Age-related osteoporosis without current pathological fracture: Secondary | ICD-10-CM | POA: Diagnosis not present

## 2015-09-03 DIAGNOSIS — J9621 Acute and chronic respiratory failure with hypoxia: Secondary | ICD-10-CM | POA: Diagnosis not present

## 2015-09-03 DIAGNOSIS — R0602 Shortness of breath: Secondary | ICD-10-CM | POA: Diagnosis not present

## 2015-09-03 DIAGNOSIS — Z853 Personal history of malignant neoplasm of breast: Secondary | ICD-10-CM | POA: Diagnosis not present

## 2015-09-03 DIAGNOSIS — R9431 Abnormal electrocardiogram [ECG] [EKG]: Secondary | ICD-10-CM | POA: Diagnosis not present

## 2015-09-03 DIAGNOSIS — R069 Unspecified abnormalities of breathing: Secondary | ICD-10-CM | POA: Diagnosis not present

## 2015-09-03 DIAGNOSIS — Z90710 Acquired absence of both cervix and uterus: Secondary | ICD-10-CM | POA: Diagnosis not present

## 2015-09-03 DIAGNOSIS — F411 Generalized anxiety disorder: Secondary | ICD-10-CM | POA: Diagnosis not present

## 2015-09-03 DIAGNOSIS — E119 Type 2 diabetes mellitus without complications: Secondary | ICD-10-CM | POA: Diagnosis present

## 2015-09-03 HISTORY — DX: Chronic obstructive pulmonary disease, unspecified: J44.9

## 2015-09-03 HISTORY — DX: Unspecified malignant neoplasm of skin, unspecified: C44.90

## 2015-09-03 HISTORY — DX: Depression, unspecified: F32.A

## 2015-09-03 HISTORY — DX: Major depressive disorder, single episode, unspecified: F32.9

## 2015-09-03 HISTORY — DX: Anxiety disorder, unspecified: F41.9

## 2015-09-03 HISTORY — DX: Other specified chronic obstructive pulmonary disease: J44.89

## 2015-09-03 HISTORY — DX: Malignant neoplasm of unspecified site of left female breast: C50.912

## 2015-09-03 HISTORY — DX: Type 2 diabetes mellitus without complications: E11.9

## 2015-09-03 HISTORY — DX: Chronic obstructive pulmonary disease with (acute) exacerbation: J44.1

## 2015-09-03 HISTORY — DX: Dependence on supplemental oxygen: Z99.81

## 2015-09-03 LAB — CBC WITH DIFFERENTIAL/PLATELET
Basophils Absolute: 0 10*3/uL (ref 0.0–0.1)
Basophils Relative: 1 % (ref 0–1)
EOS PCT: 2 % (ref 0–5)
Eosinophils Absolute: 0.2 10*3/uL (ref 0.0–0.7)
HCT: 40.3 % (ref 36.0–46.0)
Hemoglobin: 13.2 g/dL (ref 12.0–15.0)
LYMPHS ABS: 3.8 10*3/uL (ref 0.7–4.0)
Lymphocytes Relative: 44 % (ref 12–46)
MCH: 27 pg (ref 26.0–34.0)
MCHC: 32.8 g/dL (ref 30.0–36.0)
MCV: 82.4 fL (ref 78.0–100.0)
MONOS PCT: 7 % (ref 3–12)
Monocytes Absolute: 0.6 10*3/uL (ref 0.1–1.0)
Neutro Abs: 4 10*3/uL (ref 1.7–7.7)
Neutrophils Relative %: 46 % (ref 43–77)
PLATELETS: 258 10*3/uL (ref 150–400)
RBC: 4.89 MIL/uL (ref 3.87–5.11)
RDW: 16.3 % — ABNORMAL HIGH (ref 11.5–15.5)
WBC: 8.6 10*3/uL (ref 4.0–10.5)

## 2015-09-03 LAB — GLUCOSE, CAPILLARY
GLUCOSE-CAPILLARY: 177 mg/dL — AB (ref 65–99)
Glucose-Capillary: 164 mg/dL — ABNORMAL HIGH (ref 65–99)
Glucose-Capillary: 174 mg/dL — ABNORMAL HIGH (ref 65–99)

## 2015-09-03 LAB — I-STAT ARTERIAL BLOOD GAS, ED
Acid-Base Excess: 3 mmol/L — ABNORMAL HIGH (ref 0.0–2.0)
Bicarbonate: 30.1 mEq/L — ABNORMAL HIGH (ref 20.0–24.0)
O2 Saturation: 92 %
PCO2 ART: 52.1 mmHg — AB (ref 35.0–45.0)
PH ART: 7.369 (ref 7.350–7.450)
TCO2: 32 mmol/L (ref 0–100)
pO2, Arterial: 67 mmHg — ABNORMAL LOW (ref 80.0–100.0)

## 2015-09-03 LAB — COMPREHENSIVE METABOLIC PANEL
ALT: 14 U/L (ref 14–54)
ANION GAP: 8 (ref 5–15)
AST: 19 U/L (ref 15–41)
Albumin: 3.9 g/dL (ref 3.5–5.0)
Alkaline Phosphatase: 18 U/L — ABNORMAL LOW (ref 38–126)
BUN: 13 mg/dL (ref 6–20)
CHLORIDE: 97 mmol/L — AB (ref 101–111)
CO2: 32 mmol/L (ref 22–32)
Calcium: 9.6 mg/dL (ref 8.9–10.3)
Creatinine, Ser: 0.72 mg/dL (ref 0.44–1.00)
GFR calc non Af Amer: >60 mL/min (ref >60)
Glucose, Bld: 126 mg/dL — ABNORMAL HIGH (ref 65–99)
POTASSIUM: 3.5 mmol/L (ref 3.5–5.1)
SODIUM: 137 mmol/L (ref 135–145)
Total Bilirubin: 0.5 mg/dL (ref 0.3–1.2)
Total Protein: 6.8 g/dL (ref 6.5–8.1)

## 2015-09-03 LAB — TROPONIN I: Troponin I: <0.03 ng/mL (ref <0.031)

## 2015-09-03 LAB — MAGNESIUM: MAGNESIUM: 1.7 mg/dL (ref 1.7–2.4)

## 2015-09-03 LAB — BRAIN NATRIURETIC PEPTIDE: B NATRIURETIC PEPTIDE 5: 109.2 pg/mL — AB (ref 0.0–100.0)

## 2015-09-03 MED ORDER — FENOFIBRATE 160 MG PO TABS
160.0000 mg | ORAL_TABLET | Freq: Every day | ORAL | Status: DC
  Administered 2015-09-03 – 2015-09-05 (×3): 160 mg via ORAL
  Filled 2015-09-03 (×3): qty 1

## 2015-09-03 MED ORDER — ENOXAPARIN SODIUM 40 MG/0.4ML ~~LOC~~ SOLN
40.0000 mg | SUBCUTANEOUS | Status: DC
  Administered 2015-09-03 – 2015-09-04 (×2): 40 mg via SUBCUTANEOUS
  Filled 2015-09-03 (×2): qty 0.4

## 2015-09-03 MED ORDER — IPRATROPIUM BROMIDE 0.02 % IN SOLN: 0.5000 mg | RESPIRATORY_TRACT | Status: DC | PRN

## 2015-09-03 MED ORDER — LEVALBUTEROL HCL 0.63 MG/3ML IN NEBU: 0.6300 mg | INHALATION_SOLUTION | RESPIRATORY_TRACT | Status: DC | PRN

## 2015-09-03 MED ORDER — NICOTINE 14 MG/24HR TD PT24
14.0000 mg | MEDICATED_PATCH | Freq: Every day | TRANSDERMAL | Status: DC
  Administered 2015-09-03 – 2015-09-05 (×3): 14 mg via TRANSDERMAL
  Filled 2015-09-03 (×3): qty 1

## 2015-09-03 MED ORDER — POLYETHYLENE GLYCOL 3350 17 G PO PACK
17.0000 g | PACK | Freq: Every day | ORAL | Status: DC
  Administered 2015-09-03 – 2015-09-04 (×2): 17 g via ORAL
  Filled 2015-09-03 (×3): qty 1

## 2015-09-03 MED ORDER — SODIUM CHLORIDE 0.9 % IJ SOLN
3.0000 mL | Freq: Two times a day (BID) | INTRAMUSCULAR | Status: DC
  Administered 2015-09-04 – 2015-09-05 (×2): 3 mL via INTRAVENOUS

## 2015-09-03 MED ORDER — LIDOCAINE HCL (PF) 1 % IJ SOLN
5.0000 mL | Freq: Once | INTRAMUSCULAR | Status: AC
  Administered 2015-09-03: 5 mL
  Filled 2015-09-03: qty 5

## 2015-09-03 MED ORDER — AMLODIPINE BESYLATE 10 MG PO TABS
10.0000 mg | ORAL_TABLET | Freq: Every day | ORAL | Status: DC
  Administered 2015-09-03 – 2015-09-05 (×3): 10 mg via ORAL
  Filled 2015-09-03 (×3): qty 1

## 2015-09-03 MED ORDER — ONDANSETRON HCL 4 MG PO TABS
4.0000 mg | ORAL_TABLET | Freq: Four times a day (QID) | ORAL | Status: DC | PRN
Start: 2015-09-03 — End: 2015-09-04

## 2015-09-03 MED ORDER — PANTOPRAZOLE SODIUM 40 MG PO TBEC
40.0000 mg | DELAYED_RELEASE_TABLET | Freq: Every day | ORAL | Status: DC
  Administered 2015-09-04 – 2015-09-05 (×2): 40 mg via ORAL
  Filled 2015-09-03 (×3): qty 1

## 2015-09-03 MED ORDER — ALBUTEROL SULFATE (2.5 MG/3ML) 0.083% IN NEBU: 2.5000 mg | INHALATION_SOLUTION | RESPIRATORY_TRACT | Status: DC | PRN

## 2015-09-03 MED ORDER — INSULIN ASPART 100 UNIT/ML ~~LOC~~ SOLN
0.0000 [IU] | Freq: Three times a day (TID) | SUBCUTANEOUS | Status: DC
  Administered 2015-09-03: 3 [IU] via SUBCUTANEOUS
  Administered 2015-09-04: 5 [IU] via SUBCUTANEOUS
  Administered 2015-09-04: 3 [IU] via SUBCUTANEOUS
  Administered 2015-09-04: 5 [IU] via SUBCUTANEOUS
  Administered 2015-09-05: 3 [IU] via SUBCUTANEOUS

## 2015-09-03 MED ORDER — POTASSIUM CHLORIDE CRYS ER 20 MEQ PO TBCR
40.0000 meq | EXTENDED_RELEASE_TABLET | Freq: Once | ORAL | Status: AC
  Administered 2015-09-03: 40 meq via ORAL
  Filled 2015-09-03: qty 2

## 2015-09-03 MED ORDER — LEVOFLOXACIN IN D5W 500 MG/100ML IV SOLN
500.0000 mg | INTRAVENOUS | Status: DC
  Administered 2015-09-03 – 2015-09-04 (×2): 500 mg via INTRAVENOUS
  Filled 2015-09-03 (×2): qty 100

## 2015-09-03 MED ORDER — IPRATROPIUM BROMIDE 0.02 % IN SOLN
0.5000 mg | Freq: Four times a day (QID) | RESPIRATORY_TRACT | Status: DC
  Administered 2015-09-03 – 2015-09-04 (×2): 0.5 mg via RESPIRATORY_TRACT
  Filled 2015-09-03 (×2): qty 2.5

## 2015-09-03 MED ORDER — CITALOPRAM HYDROBROMIDE 20 MG PO TABS
20.0000 mg | ORAL_TABLET | Freq: Every day | ORAL | Status: DC
  Administered 2015-09-03 – 2015-09-05 (×3): 20 mg via ORAL
  Filled 2015-09-03 (×3): qty 1

## 2015-09-03 MED ORDER — LEVALBUTEROL HCL 0.63 MG/3ML IN NEBU
0.6300 mg | INHALATION_SOLUTION | Freq: Four times a day (QID) | RESPIRATORY_TRACT | Status: DC
  Administered 2015-09-03 – 2015-09-04 (×2): 0.63 mg via RESPIRATORY_TRACT
  Filled 2015-09-03 (×2): qty 3

## 2015-09-03 MED ORDER — SODIUM CHLORIDE 0.9 % IV SOLN
INTRAVENOUS | Status: DC
  Administered 2015-09-03 – 2015-09-04 (×2): via INTRAVENOUS

## 2015-09-03 MED ORDER — ACETAMINOPHEN 650 MG RE SUPP: 650.0000 mg | Freq: Four times a day (QID) | RECTAL | Status: DC | PRN

## 2015-09-03 MED ORDER — ATORVASTATIN CALCIUM 40 MG PO TABS
40.0000 mg | ORAL_TABLET | Freq: Every day | ORAL | Status: DC
  Administered 2015-09-04: 40 mg via ORAL
  Filled 2015-09-03: qty 1

## 2015-09-03 MED ORDER — ONDANSETRON HCL 4 MG/2ML IJ SOLN: 4.0000 mg | Freq: Four times a day (QID) | INTRAMUSCULAR | Status: DC | PRN

## 2015-09-03 MED ORDER — INFLUENZA VAC SPLIT QUAD 0.5 ML IM SUSY
0.5000 mL | PREFILLED_SYRINGE | INTRAMUSCULAR | Status: AC
  Administered 2015-09-04: 0.5 mL via INTRAMUSCULAR
  Filled 2015-09-03: qty 0.5

## 2015-09-03 MED ORDER — ACETAMINOPHEN 325 MG PO TABS
650.0000 mg | ORAL_TABLET | Freq: Four times a day (QID) | ORAL | Status: DC | PRN
  Filled 2015-09-03: qty 2

## 2015-09-03 MED ORDER — ALPRAZOLAM 0.5 MG PO TABS
0.5000 mg | ORAL_TABLET | Freq: Two times a day (BID) | ORAL | Status: DC
  Administered 2015-09-03 – 2015-09-04 (×2): 0.5 mg via ORAL
  Filled 2015-09-03 (×2): qty 1

## 2015-09-03 MED ORDER — SENNOSIDES-DOCUSATE SODIUM 8.6-50 MG PO TABS
1.0000 | ORAL_TABLET | Freq: Every evening | ORAL | Status: DC | PRN
  Administered 2015-09-05: 1 via ORAL
  Filled 2015-09-03: qty 1

## 2015-09-03 MED ORDER — OXYCODONE HCL 5 MG PO TABS: 5.0000 mg | ORAL_TABLET | ORAL | Status: DC | PRN

## 2015-09-03 MED ORDER — SORBITOL 70 % SOLN: 30.0000 mL | Freq: Every day | Status: DC | PRN

## 2015-09-03 MED ORDER — METHYLPREDNISOLONE SODIUM SUCC 125 MG IJ SOLR
80.0000 mg | Freq: Four times a day (QID) | INTRAMUSCULAR | Status: DC
  Administered 2015-09-03 – 2015-09-04 (×4): 80 mg via INTRAVENOUS
  Filled 2015-09-03 (×4): qty 2

## 2015-09-03 NOTE — ED Notes (Signed)
Pt returned to room. Monitored by pulse ox, bp cuff, and 5-lead. 

## 2015-09-03 NOTE — Progress Notes (Signed)
   Subjective:    Patient ID: Jacqueline Cervantes, female    DOB: 04-12-1935, 80 y.o.   MRN: [id number]  HPI Patient brought in by daughter- she is very concerned about her breathing- SHe has been having some confusion- They have O2 monitor and hers was 82 on room air. She used her husbands o2 and it increased to 88%. Family is very worried.    Review of Systems  Constitutional: Positive for fatigue.  Respiratory: Positive for cough, shortness of breath, wheezing and stridor.   Cardiovascular: Negative.   Gastrointestinal: Negative.   Musculoskeletal: Negative.   Psychiatric/Behavioral: Negative.   All other systems reviewed and are negative.      Objective:   Physical Exam  Constitutional: She is oriented to person, place, and time. She appears well-developed and well-nourished.  Cardiovascular: Normal rate, regular rhythm and normal heart sounds.   Pulmonary/Chest: Effort normal. She has wheezes (exp sheezes throughout). She has rales (crackles throughout).  Neurological: She is alert and oriented to person, place, and time.  Skin: Skin is warm.  Psychiatric: She has a normal mood and affect. Her behavior is normal. Judgment and thought content normal.    BP 133/75 mmHg  Pulse 91  Temp(Src) 97.8 F (36.6 C) (Oral)  Resp 0  Ht '5\' 4"'$  (1.626 m)  Wt 128 lb (58.06 kg)  BMI 21.96 kg/m2  SpO2 88%  SAO2 89% on room air sitting- decreased to 84% with walking  O2 via nassl [place]nulla at 2L- 1130 O2 sat on 2L 96%-1143  Iv 18g right forearm- NACL KVO    Assessment & Plan:   1. COPD with chronic bronchitis   2. Low oxygen saturation    Transported to [facility] vis ambulance Will let ER do chest x ray and work up  [employer], Will

## 2015-09-03 NOTE — ED Provider Notes (Signed)
CSN: [id number]     Arrival date & time 09/03/15  1248 History   First MD Initiated Contact with Patient 09/03/15 1249     Chief Complaint  Patient presents with  . Shortness of Breath     (Consider location/radiation/quality/duration/timing/severity/associated sxs/prior Treatment) HPI Comments: Patient presents to ER for evaluation of shortness of breath. Patient reports that she has had worsening shortness of breath for several days, increased cough with minimal sputum production. Patient went to her primary care doctor today and was sent to the ER by and because of difficulty breathing. EMS reports that she had significant wheezing apparent on their arrival. She was placed on supplemental oxygen, administered IV Solu-Medrol and duo nebs, has improved. Patient reports her breathing is improved now since treatment. She does have a history of COPD. There is no chest pain.   Past Medical History  Diagnosis Date  . Hypercholesteremia   . Diabetes mellitus   . History of breast cancer   . Arthritis   . Hypertension   . GERD (gastroesophageal reflux disease)   . Cancer     Breast   Past Surgical History  Procedure Laterality Date  . Abdominal hysterectomy    . Breast surgery  05/11/2006    mastectomy   Family History  Problem Relation Age of Onset  . Diabetes Mother   . Cancer Father     unknown   Social History  Substance Use Topics  . Smoking status: Current Every Day Smoker -- 0.50 packs/day for 51 years    Types: Cigarettes  . Smokeless tobacco: Never Used  . Alcohol Use: No   OB History    Gravida Para Term Preterm AB TAB SAB Ectopic Multiple Living   '1 1 1       1     '$ Review of Systems  Constitutional: Negative for fever.  Respiratory: Positive for cough and shortness of breath.   Cardiovascular: Negative for chest pain and leg swelling.  All other systems reviewed and are negative.     Allergies  Review of patient's allergies indicates no known  allergies.  Home Medications   Prior to Admission medications   Medication Sig Start Date End Date Taking? Authorizing Provider  albuterol (PROVENTIL HFA;VENTOLIN HFA) 108 (90 BASE) MCG/ACT inhaler Inhale 2 puffs into the lungs every 6 (six) hours as needed for wheezing or shortness of breath. 07/11/14  Yes Sharion Balloon, FNP  alendronate (FOSAMAX) 70 MG tablet TAKE 1 TABLET BY MOUTH EVERY WEEK WITH A FULL GLASS OF WATER ON AN EMPTY STOMACH ON FRIDAYS 07/28/15  Yes Mary-Margaret Hassell Done, FNP  ALPRAZolam Duanne Moron) 0.5 MG tablet TAKE 1 TABLET BY MOUTH TWO TIMES A DAY 08/07/15  Yes Mary-Margaret Hassell Done, FNP  amLODipine (NORVASC) 10 MG tablet Take 1 tablet (10 mg total) by mouth daily. 05/15/15  Yes Mary-Margaret Hassell Done, FNP  atorvastatin (LIPITOR) 40 MG tablet Take 1 tablet (40 mg total) by mouth daily. 05/15/15  Yes Mary-Margaret Hassell Done, FNP  budesonide-formoterol (SYMBICORT) 80-4.5 MCG/ACT inhaler Inhale 2 puffs into the lungs 2 (two) times daily. 08/20/15  Yes Mary-Margaret Hassell Done, FNP  Calcium Carbonate-Vitamin D (CALCIUM 600+D) 600-400 MG-UNIT per tablet Take 1 tablet by mouth daily.      Historical Provider, MD  citalopram (CELEXA) 20 MG tablet TAKE 1 TABLET EVERY DAY 09/17/14   Mary-Margaret Hassell Done, FNP  fenofibrate 160 MG tablet Take 1 tablet (160 mg total) by mouth daily. 05/15/15   Mary-Margaret Hassell Done, FNP  hydrochlorothiazide (MICROZIDE) 12.5 MG capsule Take  1 capsule (12.5 mg total) by mouth daily. 08/20/15   Mary-Margaret Hassell Done, FNP  lisinopril (PRINIVIL,ZESTRIL) 40 MG tablet TAKE 1 TABLET (40 MG TOTAL) BY MOUTH DAILY. 05/04/15   Mary-Margaret Hassell Done, FNP  metFORMIN (GLUCOPHAGE) 500 MG tablet Take 1-2 tablets (500-1,000 mg total) by mouth 2 (two) times daily with a meal. Takes 1000 mg in the morning and 500 mg in the evening. 05/15/15   Mary-Margaret Hassell Done, FNP  Omega-3 Fatty Acids (FISH OIL PO) Take 1 capsule by mouth daily.    Historical Provider, MD  polyethylene glycol (MIRALAX / GLYCOLAX)  packet Take 17 g by mouth daily.    Historical Provider, MD  tobramycin-dexamethasone [facility]) ophthalmic solution  06/30/14   Historical Provider, MD   BP 125/63 mmHg  Pulse 101  Temp(Src) 98.1 F (36.7 C) (Oral)  Resp 18  SpO2 95% Physical Exam  Constitutional: She is oriented to person, place, and time. She appears well-developed and well-nourished. No distress.  HENT:  Head: Normocephalic and atraumatic.  Right Ear: Hearing normal.  Left Ear: Hearing normal.  Nose: Nose normal.  Mouth/Throat: Oropharynx is clear and moist and mucous membranes are normal.  Eyes: Conjunctivae and EOM are normal. Pupils are equal, round, and reactive to light.  Neck: Normal range of motion. Neck supple.  Cardiovascular: Regular rhythm, S1 normal and S2 normal.  Exam reveals no gallop and no friction rub.   No murmur heard. Pulmonary/Chest: Accessory muscle usage present. Tachypnea noted. No respiratory distress. She has decreased breath sounds. She has wheezes. She exhibits no tenderness.  Abdominal: Soft. Normal appearance and bowel sounds are normal. There is no hepatosplenomegaly. There is no tenderness. There is no rebound, no guarding, no tenderness at McBurney's point and negative Murphy's sign. No hernia.  Musculoskeletal: Normal range of motion.  Neurological: She is alert and oriented to person, place, and time. She has normal strength. No cranial nerve deficit or sensory deficit. Coordination normal. GCS eye subscore is 4. GCS verbal subscore is 5. GCS motor subscore is 6.  Skin: Skin is warm, dry and intact. No rash noted. No cyanosis.  Psychiatric: She has a normal mood and affect. Her speech is normal and behavior is normal. Thought content normal.  Nursing note and vitals reviewed.   ED Course  ARTERIAL BLOOD GAS Date/Time: 09/03/2015 1:49 PM Performed by: Orpah Greek Authorized by: Orpah Greek Consent: Verbal consent obtained. Risks and benefits: risks, benefits  and alternatives were discussed Consent given by: patient Patient understanding: patient states understanding of the procedure being performed Patient consent: the patient's understanding of the procedure matches consent given Procedure consent: procedure consent matches procedure scheduled Site marked: the operative site was marked Patient identity confirmed: verbally with patient and hospital-assigned identification number Time out: Immediately prior to procedure a "time out" was called to verify the correct patient, procedure, equipment, support staff and site/side marked as required. Anesthesia: local infiltration Local anesthetic: lidocaine 1% without epinephrine Anesthetic total: 1 ml Location: right radial Preparation: Patient was prepped and draped in the usual sterile fashion. Allen's test normal: yes Number of attempts: 1 Method: Single percutaneous needle puncture (utilizing ultrasound guidance) Manual pressure: manual pressure applied for more than 5 minutes Post-procedure: dressing applied Post-procedure CMS: normal Patient tolerance: Patient tolerated the procedure well with no immediate complications   (including critical care time)  Labs Review Labs Reviewed  CBC WITH DIFFERENTIAL/PLATELET - Abnormal; Notable for the following:    RDW 16.3 (*)    All other components within  normal limits  COMPREHENSIVE METABOLIC PANEL - Abnormal; Notable for the following:    Chloride 97 (*)    Glucose, Bld 126 (*)    Alkaline Phosphatase 18 (*)    All other components within normal limits  BRAIN NATRIURETIC PEPTIDE - Abnormal; Notable for the following:    B Natriuretic Peptide 109.2 (*)    All other components within normal limits  I-STAT ARTERIAL BLOOD GAS, ED - Abnormal; Notable for the following:    pCO2 arterial 52.1 (*)    pO2, Arterial 67.0 (*)    Bicarbonate 30.1 (*)    Acid-Base Excess 3.0 (*)    All other components within normal limits  TROPONIN I    Imaging  Review Dg Chest 2 View  09/03/2015   CLINICAL DATA:  Shortness of breath.  EXAM: CHEST  2 VIEW  COMPARISON:  Chest x-ray dated 08/16/2011 and report of CT scan of the chest dated 08/16/2011  FINDINGS: Heart size and pulmonary vascularity are normal. Extensive calcification in the aorta. Old compression deformities of T8 and L1. Lungs are clear.  IMPRESSION: No active cardiopulmonary disease.   Electronically Signed   By: Lorriane Shire M.D.   On: 09/03/2015 14:25   I have personally reviewed and evaluated these images and lab results as part of my medical decision-making.   EKG Interpretation   Date/Time:  Thursday September 03 2015 12:53:53 EDT Ventricular Rate:  97 PR Interval:  153 QRS Duration: 88 QT Interval:  400 QTC Calculation: 508 R Axis:   68 Text Interpretation:  Sinus rhythm LVH with secondary repolarization  abnormality Anterior Q waves, possibly due to LVH Prolonged QT interval  Baseline wander in lead(s) V1 No significant change since last tracing  Confirmed by Rudell Marlowe  MD, [place] [phone]) on 09/03/2015 1:00:39 PM      MDM   Final diagnoses:  None  copd exacerbation  Patient presents to the ER for evaluation of worsening shortness of breath. Patient has had a history of COPD. She has been using her albuterol at home without improvement. She does not use oxygen at home. She visited her primary care doctor this morning and was sent to the ER for further evaluation because of severe dyspnea. Patient found to be mildly hypoxic on arrival. She also is mildly retaining CO2, by blood gas. She is mentating well, however.  Cardiac evaluation unremarkable. Chest x-ray does not show acute pathology. Patient still having significant wheezing after nebulizer treatments and Solu-Medrol. She will require hospitalization for further management.    Orpah Greek, MD 09/03/15 [phone]

## 2015-09-03 NOTE — ED Notes (Signed)
RA O2 sats at 86%, pt placed on 2L O2 nasal cannula, sats at 89% - pt has COPD.

## 2015-09-03 NOTE — ED Notes (Signed)
Pt to ED via EMS - pt called out due to respiratory distress. 86% on RA, was at doctors office at [facility]. In route pt was given 125 mg solumedrol, 7.5 albuterol and 0.5 atrovent. A/o x4. Pt smokes 1/2/ day and has COPD. VSS

## 2015-09-03 NOTE — ED Notes (Signed)
Pt placed in gown and in bed. Pt monitored by pulse ox, bp cuff, and 12-lead. 

## 2015-09-03 NOTE — ED Notes (Signed)
Patient transported to X-ray 

## 2015-09-03 NOTE — H&P (Signed)
[facility] History and Physical  Jacqueline Cervantes:[id number] DOB: 1935-05-09 DOA: 09/03/2015  Referring physician: Dr. Betsey Holiday PCP: [name], FNP   Chief Complaint: Shortness of breath/hypoxia  HPI: Jacqueline Cervantes is a 80 y.o. female  With history of diabetes mellitus, hypertension, gastroesophageal reflux disease, ongoing tobacco abuse presenting to the ED with several days of worsening shortness of breath, nonproductive cough, wheezing. Patient's daughter had come to patient's home and noted that patient was hypoxic with sats in the 31s per son-in-law. Patient subsequently presented to PCPs office where she was sent to the ED due to shortness of breath hypoxia and wheezing. On route via EMS patient was given some supplemental oxygen, IV Solu-Medrol, DuoNeb's with clinical improvement. Patient denies fever, no chills, no nausea, no vomiting, no chest pain, no abdominal pain, no dysuria, no constipation, no diarrhea, no melanotic, no hematemesis, no hematochezia, no visual changes. Patient does endorse some generalized weakness and soft stools. Patient stated she started using her husband's oxygen due to her shortness of breath and hypoxia. Patient was seen in the ED given some DuoNeb's with continued improvement in his symptoms. [facility] were called to admit the patient for further evaluation and management. In the ED, ABG obtained had a pH of 7.369, PCO2 of 52, PO2 of 67, bicarbonate of 30. Comprehensive metabolic profile with a chloride of 97 glucose of 126 alk phosphatase of 18 otherwise was within normal limits. BNP was 109.2. First set of troponin was negative. CBC was unremarkable. Chest x-ray is negative for any acute abnormalities. EKG showed QTC prolongation and LVH with repolarization.   Review of Systems: As per history of present illness otherwise negative. Constitutional:  No weight loss, night sweats, Fevers, chills, fatigue.  HEENT:  No headaches,  Difficulty swallowing,Tooth/dental problems,Sore throat,  No sneezing, itching, ear ache, nasal congestion, post nasal drip,  Cardio-vascular:  No chest pain, Orthopnea, PND, swelling in lower extremities, anasarca, dizziness, palpitations  GI:  No heartburn, indigestion, abdominal pain, nausea, vomiting, diarrhea, change in bowel habits, loss of appetite  Resp:  No shortness of breath with exertion or at rest. No excess mucus, no productive cough, No non-productive cough, No coughing up of blood.No change in color of mucus.No wheezing.No chest wall deformity  Skin:  no rash or lesions.  GU:  no dysuria, change in color of urine, no urgency or frequency. No flank pain.  Musculoskeletal:  No joint pain or swelling. No decreased range of motion. No back pain.  Psych:  No change in mood or affect. No depression or anxiety. No memory loss.   Past Medical History  Diagnosis Date  . Hypercholesteremia   . Diabetes mellitus   . History of breast cancer   . Arthritis   . Hypertension   . GERD (gastroesophageal reflux disease)   . Cancer     Breast   Past Surgical History  Procedure Laterality Date  . Abdominal hysterectomy    . Breast surgery  05/11/2006    mastectomy   Social History:  reports that she has been smoking Cigarettes.  She has a 25.5 pack-year smoking history. She has never used smokeless tobacco. She reports that she does not drink alcohol or use illicit drugs.  No Known Allergies  Family History  Problem Relation Age of Onset  . Diabetes Mother   . Cancer Father     unknown     Prior to Admission medications   Medication Sig Start Date End Date Taking? Authorizing Provider  albuterol (PROVENTIL HFA;VENTOLIN HFA) 108 (90 BASE) MCG/ACT inhaler Inhale 2 puffs into the lungs every 6 (six) hours as needed for wheezing or shortness of breath. 07/11/14  Yes Sharion Balloon, FNP  alendronate (FOSAMAX) 70 MG tablet TAKE 1 TABLET BY MOUTH EVERY WEEK WITH A FULL GLASS OF  WATER ON AN EMPTY STOMACH ON FRIDAYS 07/28/15  Yes Mary-Margaret Hassell Done, FNP  ALPRAZolam Duanne Moron) 0.5 MG tablet TAKE 1 TABLET BY MOUTH TWO TIMES A DAY 08/07/15  Yes Mary-Margaret Hassell Done, FNP  amLODipine (NORVASC) 10 MG tablet Take 1 tablet (10 mg total) by mouth daily. 05/15/15  Yes Mary-Margaret Hassell Done, FNP  atorvastatin (LIPITOR) 40 MG tablet Take 1 tablet (40 mg total) by mouth daily. 05/15/15  Yes Mary-Margaret Hassell Done, FNP  budesonide-formoterol (SYMBICORT) 80-4.5 MCG/ACT inhaler Inhale 2 puffs into the lungs 2 (two) times daily. 08/20/15  Yes Mary-Margaret Hassell Done, FNP  Calcium Carbonate-Vitamin D (CALCIUM 600+D) 600-400 MG-UNIT per tablet Take 1 tablet by mouth daily.     Yes Historical Provider, MD  citalopram (CELEXA) 20 MG tablet TAKE 1 TABLET EVERY DAY 09/17/14  Yes Mary-Margaret Hassell Done, FNP  fenofibrate 160 MG tablet Take 1 tablet (160 mg total) by mouth daily. 05/15/15  Yes Mary-Margaret Hassell Done, FNP  hydrochlorothiazide (MICROZIDE) 12.5 MG capsule Take 1 capsule (12.5 mg total) by mouth daily. 08/20/15  Yes Mary-Margaret Hassell Done, FNP  lisinopril (PRINIVIL,ZESTRIL) 40 MG tablet TAKE 1 TABLET (40 MG TOTAL) BY MOUTH DAILY. 05/04/15  Yes Mary-Margaret Hassell Done, FNP  metFORMIN (GLUCOPHAGE) 500 MG tablet Take 1-2 tablets (500-1,000 mg total) by mouth 2 (two) times daily with a meal. Takes 1000 mg in the morning and 500 mg in the evening. 05/15/15  Yes Mary-Margaret Hassell Done, FNP  Omega-3 Fatty Acids (FISH OIL PO) Take 1 capsule by mouth daily.   Yes Historical Provider, MD  polyethylene glycol (MIRALAX / GLYCOLAX) packet Take 17 g by mouth daily.   Yes Historical Provider, MD   Physical Exam: Filed Vitals:   09/03/15 1545 09/03/15 1600 09/03/15 1703 09/03/15 1705  BP: 123/60 145/72 138/74   Pulse: 91 89 93   Temp:   98.6 F (37 C)   TempSrc:   Oral   Resp: '22 26 20   ' Height:   '5\' 4"'  (1.626 m)   Weight:   57.7 kg (127 lb 3.3 oz)   SpO2: 94% 93% 89% 94%    Wt Readings from Last 3 Encounters:  09/03/15  57.7 kg (127 lb 3.3 oz)  09/03/15 58.06 kg (128 lb)  08/20/15 58.514 kg (129 lb)    General:  Frail elderly female lying on gurney speaking in somewhat full sentences in no acute cardiopulmonary distress.  Eyes: PERRLA, EOMI, normal lids, irises & conjunctiva ENT: grossly normal hearing, lips & tongue Neck: no LAD, masses or thyromegaly Cardiovascular: RRR, no m/r/g. No LE edema. Telemetry: SR, no arrhythmias  Respiratory: Tight. Inspiratory and expiratory wheezing. No crackles. Poor air movement.  Abdomen: soft, ntnd, positive bowel sounds, no rebound, no guarding  Skin: no rash or induration seen on limited exam Musculoskeletal: grossly normal tone BUE/BLE Psychiatric: grossly normal mood and affect, speech fluent and appropriate Neurologic:  Alert and on to 3. Cranial nerves II through XII are grossly intact. No focal deficits.          Labs on Admission:  Basic Metabolic Panel:  Recent Labs Lab 09/03/15 1304  NA 137  K 3.5  CL 97*  CO2 32  GLUCOSE 126*  BUN 13  CREATININE 0.72  CALCIUM 9.6   Liver Function Tests:  Recent Labs Lab 09/03/15 1304  AST 19  ALT 14  ALKPHOS 18*  BILITOT 0.5  PROT 6.8  ALBUMIN 3.9   No results for input(s): LIPASE, AMYLASE in the last 168 hours. No results for input(s): AMMONIA in the last 168 hours. CBC:  Recent Labs Lab 09/03/15 1304  WBC 8.6  NEUTROABS 4.0  HGB 13.2  HCT 40.3  MCV 82.4  PLT 258   Cardiac Enzymes:  Recent Labs Lab 09/03/15 1304  TROPONINI <0.03    BNP (last 3 results)  Recent Labs  09/03/15 1304  BNP 109.2*    ProBNP (last 3 results) No results for input(s): PROBNP in the last 8760 hours.  CBG:  Recent Labs Lab 09/03/15 1735  GLUCAP 177*    Radiological Exams on Admission: Dg Chest 2 View  09/03/2015   CLINICAL DATA:  Shortness of breath.  EXAM: CHEST  2 VIEW  COMPARISON:  Chest x-ray dated 08/16/2011 and report of CT scan of the chest dated 08/16/2011  FINDINGS: Heart size and  pulmonary vascularity are normal. Extensive calcification in the aorta. Old compression deformities of T8 and L1. Lungs are clear.  IMPRESSION: No active cardiopulmonary disease.   Electronically Signed   By: Lorriane Shire M.D.   On: 09/03/2015 14:25    EKG: Independently reviewed. LVH with repolarization.  Assessment/Plan Principal Problem:   COPD with acute exacerbation Active Problems:   HTN (hypertension)   HLD (hyperlipidemia)   DM (diabetes mellitus)   Osteoporosis   Tobacco user   Generalized anxiety disorder   COPD with chronic bronchitis   COPD exacerbation  #1 COPD with acute exacerbation Patient with ongoing tobacco abuse presented with shortness of breath, hypoxia and wheezing. Will admit the patient to telemetry. Place on IV Solu-Medrol, IV Levaquin, scheduled nebulizer treatments, oxygen, Tessalon Perles, follow.  #2 hypertension BP stable. Resume home dose Norvasc. Hold HCTZ and ACE inhibitor for now and may resume if blood pressure increases. Follow.  #3 diabetes mellitus Check a hemoglobin A1c. Place on sliding scale insulin.  #4 tobacco abuse Tobacco cessation. Place on a nicotine patch.  #5 hyperlipidemia Continue home regimen.  #6 Gen. anxiety disorder Continue Celexa and anxiolytics.  #7 prophylaxis PPI for GI prophylaxis. Lovenox for DVT prophylaxis.  Code Status: DO NOT RESUSCITATE DVT Prophylaxis: Lovenox Family Communication: Updated patient, daughter and son-in-law at bedside. Disposition Plan: Admit to telemetry.  Time spent: 75 minutes  Kathye Cipriani M.D. [facility] Pager [phone]

## 2015-09-04 DIAGNOSIS — Z79899 Other long term (current) drug therapy: Secondary | ICD-10-CM | POA: Diagnosis not present

## 2015-09-04 DIAGNOSIS — E780 Pure hypercholesterolemia: Secondary | ICD-10-CM | POA: Diagnosis not present

## 2015-09-04 DIAGNOSIS — F411 Generalized anxiety disorder: Secondary | ICD-10-CM | POA: Diagnosis not present

## 2015-09-04 DIAGNOSIS — J441 Chronic obstructive pulmonary disease with (acute) exacerbation: Principal | ICD-10-CM

## 2015-09-04 DIAGNOSIS — Z66 Do not resuscitate: Secondary | ICD-10-CM | POA: Diagnosis not present

## 2015-09-04 DIAGNOSIS — M810 Age-related osteoporosis without current pathological fracture: Secondary | ICD-10-CM | POA: Diagnosis not present

## 2015-09-04 DIAGNOSIS — J9621 Acute and chronic respiratory failure with hypoxia: Secondary | ICD-10-CM | POA: Diagnosis not present

## 2015-09-04 DIAGNOSIS — R9431 Abnormal electrocardiogram [ECG] [EKG]: Secondary | ICD-10-CM | POA: Diagnosis not present

## 2015-09-04 DIAGNOSIS — F17210 Nicotine dependence, cigarettes, uncomplicated: Secondary | ICD-10-CM | POA: Diagnosis not present

## 2015-09-04 DIAGNOSIS — Z9010 Acquired absence of unspecified breast and nipple: Secondary | ICD-10-CM | POA: Diagnosis not present

## 2015-09-04 DIAGNOSIS — I10 Essential (primary) hypertension: Secondary | ICD-10-CM | POA: Diagnosis not present

## 2015-09-04 DIAGNOSIS — Z23 Encounter for immunization: Secondary | ICD-10-CM | POA: Diagnosis not present

## 2015-09-04 DIAGNOSIS — F329 Major depressive disorder, single episode, unspecified: Secondary | ICD-10-CM | POA: Diagnosis not present

## 2015-09-04 DIAGNOSIS — E222 Syndrome of inappropriate secretion of antidiuretic hormone: Secondary | ICD-10-CM | POA: Diagnosis not present

## 2015-09-04 DIAGNOSIS — K219 Gastro-esophageal reflux disease without esophagitis: Secondary | ICD-10-CM | POA: Diagnosis not present

## 2015-09-04 DIAGNOSIS — Z853 Personal history of malignant neoplasm of breast: Secondary | ICD-10-CM | POA: Diagnosis not present

## 2015-09-04 DIAGNOSIS — Z85828 Personal history of other malignant neoplasm of skin: Secondary | ICD-10-CM | POA: Diagnosis not present

## 2015-09-04 DIAGNOSIS — Z90710 Acquired absence of both cervix and uterus: Secondary | ICD-10-CM | POA: Diagnosis not present

## 2015-09-04 DIAGNOSIS — E119 Type 2 diabetes mellitus without complications: Secondary | ICD-10-CM | POA: Diagnosis not present

## 2015-09-04 DIAGNOSIS — M1990 Unspecified osteoarthritis, unspecified site: Secondary | ICD-10-CM | POA: Diagnosis not present

## 2015-09-04 LAB — CBC
HCT: 37 % (ref 36.0–46.0)
Hemoglobin: 12.2 g/dL (ref 12.0–15.0)
MCH: 26.9 pg (ref 26.0–34.0)
MCHC: 33 g/dL (ref 30.0–36.0)
MCV: 81.5 fL (ref 78.0–100.0)
PLATELETS: 276 10*3/uL (ref 150–400)
RBC: 4.54 MIL/uL (ref 3.87–5.11)
RDW: 16.2 % — AB (ref 11.5–15.5)
WBC: 7.7 10*3/uL (ref 4.0–10.5)

## 2015-09-04 LAB — URINALYSIS, ROUTINE W REFLEX MICROSCOPIC
BILIRUBIN URINE: NEGATIVE
Glucose, UA: 250 mg/dL — AB
HGB URINE DIPSTICK: NEGATIVE
KETONES UR: NEGATIVE mg/dL
Leukocytes, UA: NEGATIVE
Nitrite: POSITIVE — AB
Protein, ur: NEGATIVE mg/dL
SPECIFIC GRAVITY, URINE: 1.018 (ref 1.005–1.030)
UROBILINOGEN UA: 1 mg/dL (ref 0.0–1.0)
pH: 7 (ref 5.0–8.0)

## 2015-09-04 LAB — URINE MICROSCOPIC-ADD ON

## 2015-09-04 LAB — HEMOGLOBIN A1C
HEMOGLOBIN A1C: 6 % — AB (ref 4.8–5.6)
MEAN PLASMA GLUCOSE: 126 mg/dL

## 2015-09-04 LAB — GLUCOSE, CAPILLARY
GLUCOSE-CAPILLARY: 150 mg/dL — AB (ref 65–99)
Glucose-Capillary: 245 mg/dL — ABNORMAL HIGH (ref 65–99)
Glucose-Capillary: 160 mg/dL — ABNORMAL HIGH (ref 65–99)
Glucose-Capillary: 213 mg/dL — ABNORMAL HIGH (ref 65–99)

## 2015-09-04 LAB — CREATININE, URINE, RANDOM: Creatinine, Urine: 79.68 mg/dL

## 2015-09-04 LAB — BASIC METABOLIC PANEL
Anion gap: 9 (ref 5–15)
BUN: 12 mg/dL (ref 6–20)
CALCIUM: 8.4 mg/dL — AB (ref 8.9–10.3)
CHLORIDE: 95 mmol/L — AB (ref 101–111)
CO2: 26 mmol/L (ref 22–32)
CREATININE: 0.73 mg/dL (ref 0.44–1.00)
GFR calc Af Amer: >60 mL/min (ref >60)
GFR calc non Af Amer: >60 mL/min (ref >60)
Glucose, Bld: 226 mg/dL — ABNORMAL HIGH (ref 65–99)
Potassium: 4.1 mmol/L (ref 3.5–5.1)
SODIUM: 130 mmol/L — AB (ref 135–145)

## 2015-09-04 LAB — OSMOLALITY: Osmolality: 286 mOsm/kg (ref 275–300)

## 2015-09-04 LAB — SODIUM, URINE, RANDOM: SODIUM UR: 28 mmol/L

## 2015-09-04 LAB — URIC ACID: Uric Acid, Serum: 2 mg/dL — ABNORMAL LOW (ref 2.3–6.6)

## 2015-09-04 LAB — OSMOLALITY, URINE: Osmolality, Ur: 577 mOsm/kg (ref 390–1090)

## 2015-09-04 MED ORDER — ALPRAZOLAM 0.5 MG PO TABS
0.5000 mg | ORAL_TABLET | Freq: Two times a day (BID) | ORAL | Status: DC
  Administered 2015-09-04 – 2015-09-05 (×2): 0.5 mg via ORAL
  Filled 2015-09-04 (×2): qty 1

## 2015-09-04 MED ORDER — FUROSEMIDE 10 MG/ML IJ SOLN
20.0000 mg | Freq: Once | INTRAMUSCULAR | Status: AC
  Administered 2015-09-04: 20 mg via INTRAVENOUS
  Filled 2015-09-04: qty 2

## 2015-09-04 MED ORDER — LEVALBUTEROL HCL 0.63 MG/3ML IN NEBU
0.6300 mg | INHALATION_SOLUTION | Freq: Three times a day (TID) | RESPIRATORY_TRACT | Status: DC
  Administered 2015-09-04 – 2015-09-05 (×5): 0.63 mg via RESPIRATORY_TRACT
  Filled 2015-09-04 (×5): qty 3

## 2015-09-04 MED ORDER — ZOLPIDEM TARTRATE 5 MG PO TABS
5.0000 mg | ORAL_TABLET | Freq: Once | ORAL | Status: AC
  Administered 2015-09-04: 5 mg via ORAL
  Filled 2015-09-04: qty 1

## 2015-09-04 MED ORDER — IPRATROPIUM BROMIDE 0.02 % IN SOLN
0.5000 mg | Freq: Three times a day (TID) | RESPIRATORY_TRACT | Status: DC
  Administered 2015-09-04 – 2015-09-05 (×5): 0.5 mg via RESPIRATORY_TRACT
  Filled 2015-09-04 (×5): qty 2.5

## 2015-09-04 MED ORDER — METHYLPREDNISOLONE SODIUM SUCC 125 MG IJ SOLR
80.0000 mg | Freq: Three times a day (TID) | INTRAMUSCULAR | Status: DC
  Administered 2015-09-04 – 2015-09-05 (×3): 80 mg via INTRAVENOUS
  Filled 2015-09-04 (×3): qty 2

## 2015-09-04 MED ORDER — ALPRAZOLAM 0.5 MG PO TABS
0.5000 mg | ORAL_TABLET | Freq: Once | ORAL | Status: AC
  Administered 2015-09-04: 0.5 mg via ORAL
  Filled 2015-09-04: qty 1

## 2015-09-04 NOTE — Evaluation (Signed)
Physical Therapy Evaluation Patient Details Name: Jacqueline Cervantes MRN: [id number] DOB: Feb 23, 1935 Today's Date: 09/04/2015   History of Present Illness  80 y/o WF admitted with acute exacerbation of COPD.  Clinical Impression  Pt admitted with above diagnosis. Pt currently with functional limitations due to the deficits listed below (see PT Problem List).  Pt will benefit from skilled PT to increase their independence and safety with mobility to allow discharge to the venue listed below.  Pt feels she is close to baseline and states she is not interested in HHPT at this time.  Spoke with her insurance rep who stated a [place] will becoming out to see her so if she ends up needing PT, they can request at that time.     Follow Up Recommendations Supervision for mobility/OOB    Equipment Recommendations  None recommended by PT    Recommendations for Other Services       Precautions / Restrictions Precautions Precautions: Fall Restrictions Weight Bearing Restrictions: No      Mobility  Bed Mobility Overal bed mobility: Modified Independent                Transfers Overall transfer level: Needs assistance Equipment used: None Transfers: Sit to/from Stand;Stand Pivot Transfers Sit to Stand: Supervision Stand pivot transfers: Min guard          Ambulation/Gait Ambulation/Gait assistance: Min guard Ambulation Distance (Feet): 25 Feet (x 2 reps) Assistive device: None Gait Pattern/deviations: Step-through pattern     General Gait Details: Pt declined ambulation in hallway due to having diarrhea and wanting to stay close to [facility].  Amb with o2 and no dyspnea noted, but pt c/o overall feeling weak.  Stairs            Wheelchair Mobility    Modified Rankin (Stroke Patients Only)       Balance Overall balance assessment: Needs assistance   Sitting balance-Leahy Scale: Good     Standing balance support: No upper extremity supported Standing balance-Leahy  Scale: Fair                               Pertinent Vitals/Pain Pain Assessment: No/denies pain    Home Living Family/patient expects to be discharged to:: Private residence Living Arrangements: Spouse/significant other;Children Available Help at Discharge: Family;Available 24 hours/day Type of Home: Mobile home Home Access: Ramped entrance     Home Layout: One level Home Equipment: [place] - single point;Shower seat      Prior Function Level of Independence: Independent               Hand Dominance   Dominant Hand: Right    Extremity/Trunk Assessment   Upper Extremity Assessment: Defer to OT evaluation           Lower Extremity Assessment: Overall WFL for tasks assessed;Generalized weakness      Cervical / Trunk Assessment: Normal  Communication   Communication: No difficulties  Cognition Arousal/Alertness: Awake/alert Behavior During Therapy: WFL for tasks assessed/performed Overall Cognitive Status: Within Functional Limits for tasks assessed                      General Comments      Exercises        Assessment/Plan    PT Assessment    PT Diagnosis Difficulty walking   PT Problem List    PT Treatment Interventions     PT Goals (  Current goals can be found in the Care Plan section) Acute Rehab PT Goals Patient Stated Goal: Feel better PT Goal Formulation: With patient Time For Goal Achievement: 09/18/15 Potential to Achieve Goals: Good    Frequency     Barriers to discharge        Co-evaluation               End of Session Equipment Utilized During Treatment: Oxygen Activity Tolerance: Patient tolerated treatment well;Patient limited by fatigue Patient left: in chair;with call bell/phone within reach;with chair alarm set Nurse Communication:  (nurse requested to keep o2 on during gait)         Time: 4825-0037 PT Time Calculation (min) (ACUTE ONLY): 20 min   Charges:   PT Evaluation $Initial PT  Evaluation Tier I: 1 Procedure     PT G Codes:        Jacqueline Cervantes 09/04/2015, 12:32 PM

## 2015-09-04 NOTE — Progress Notes (Signed)
Patient Demographics:    Jacqueline Cervantes, is a 80 y.o. female, DOB - [birth year], JEH:[id number]  Admit date - 09/03/2015   Admitting Physician Eugenie Filler, MD  Outpatient Primary MD for the patient is [name], [place]  LOS - 1   Chief Complaint  Patient presents with  . Shortness of Breath        Subjective:    [name] today has, No headache, No chest pain, No abdominal pain - No Nausea, No new weakness tingling or numbness, improved Cough - SOB.     Assessment  & Plan :     1. Acute on chronic hypoxic respiratory failure secondary to COPD exacerbation. Ongoing smoking counseled to quit, continue on Levaquin, IV steroids, oxygen as needed, nebulizer treatments both scheduled and as needed. Continue Levaquin. Clinically has improved. Will increase activity likely discharge in 1-2 days.   2. Smoker. Counseled to quit, continue nicotine patch.   3. Dyslipidemia. Continue home dose statin and [facility].   4. Essential hypertension. Continue on Norvasc and monitor blood pressure.   5. GERD. On PPI continue.   6. Anxiety and depression. Continue low-dose benzodiazepine along with Celexa.   7. Hyponatremia. Was getting IV fluids. Likely mild SIADH. Continue urine and serum osmolality along with urine electrolytes, gentle Lasix, hold normal saline. Repeat BMP in the morning.    8. DM type II. Continue sliding scale here. Holding Glucophage.  CBG (last 3)   Recent Labs  09/03/15 2105 09/03/15 2113 09/04/15 0550  GLUCAP 174* 164* 245*     Lab Results  Component Value Date   HGBA1C 6.0* 09/03/2015     Code Status : Full  Family Communication  :  None present  Disposition Plan  : Home in 1-2 days  Consults  :   None  Procedures  : None   DVT Prophylaxis  :   Lovenox   Lab Results  Component Value Date   PLT 276 09/04/2015    Inpatient Medications  Scheduled Meds: . ALPRAZolam  0.5 mg Oral BID  . amLODipine  10 mg Oral Daily  . atorvastatin  40 mg Oral q1800  . citalopram  20 mg Oral Daily  . enoxaparin (LOVENOX) injection  40 mg Subcutaneous Q24H  . fenofibrate  160 mg Oral Daily  . Influenza vac split quadrivalent PF  0.5 mL Intramuscular Tomorrow-1000  . insulin aspart  0-15 Units Subcutaneous TID WC  . ipratropium  0.5 mg Nebulization TID  . levalbuterol  0.63 mg Nebulization TID  . levofloxacin (LEVAQUIN) IV  500 mg Intravenous Q24H  . methylPREDNISolone (SOLU-MEDROL) injection  80 mg Intravenous Q6H  . nicotine  14 mg Transdermal Daily  . pantoprazole  40 mg Oral Q0600  . polyethylene glycol  17 g Oral Daily  . sodium chloride  3 mL Intravenous Q12H   Continuous Infusions:  PRN Meds:.acetaminophen **OR** acetaminophen, ipratropium, levalbuterol, ondansetron **OR** ondansetron (ZOFRAN) IV, oxyCODONE, senna-docusate, sorbitol  Antibiotics  :    Anti-infectives    Start     Dose/Rate Route Frequency Ordered Stop   09/03/15 1730  levofloxacin (LEVAQUIN) IVPB 500 mg     500 mg 100 mL/hr over 60 Minutes Intravenous Every 24 hours 09/03/15 1624  Objective:   Filed Vitals:   09/04/15 0530 09/04/15 0845 09/04/15 0944 09/04/15 1043  BP: 121/57  123/58 123/58  Pulse: 98  91   Temp: 98.4 F (36.9 C)  98.2 F (36.8 C)   TempSrc: Oral  Oral   Resp: 22     Height:      Weight: 58.4 kg (128 lb 12 oz)     SpO2: 97% 96% 98%     Wt Readings from Last 3 Encounters:  09/04/15 58.4 kg (128 lb 12 oz)  09/03/15 58.06 kg (128 lb)  08/20/15 58.514 kg (129 lb)     Intake/Output Summary (Last 24 hours) at 09/04/15 1055 Last data filed at 09/04/15 0600  Gross per 24 hour  Intake   1580 ml  Output    250 ml  Net   1330 ml     Physical Exam  Awake Alert, Oriented X 3, No new F.N deficits, Normal  affect [place].AT,PERRAL Supple Neck,No JVD, No cervical lymphadenopathy appriciated.  Symmetrical Chest wall movement, Mod air movement bilaterally, Coarse breath sounds RRR,No Gallops,Rubs or new Murmurs, No Parasternal Heave +ve B.Sounds, Abd Soft, No tenderness, No organomegaly appriciated, No rebound - guarding or rigidity. No Cyanosis, Clubbing or edema, No new Rash or bruise      Data Review:   Micro Results No results found for this or any previous visit (from the past 240 hour(s)).  Radiology Reports Dg Chest 2 View  09/03/2015   CLINICAL DATA:  Shortness of breath.  EXAM: CHEST  2 VIEW  COMPARISON:  Chest x-ray dated 08/16/2011 and report of CT scan of the chest dated 08/16/2011  FINDINGS: Heart size and pulmonary vascularity are normal. Extensive calcification in the aorta. Old compression deformities of T8 and L1. Lungs are clear.  IMPRESSION: No active cardiopulmonary disease.   Electronically Signed   By: Lorriane Shire M.D.   On: 09/03/2015 14:25     CBC  Recent Labs Lab 09/03/15 1304 09/04/15 0501  WBC 8.6 7.7  HGB 13.2 12.2  HCT 40.3 37.0  PLT 258 276  MCV 82.4 81.5  MCH 27.0 26.9  MCHC 32.8 33.0  RDW 16.3* 16.2*  LYMPHSABS 3.8  --   MONOABS 0.6  --   EOSABS 0.2  --   BASOSABS 0.0  --     Chemistries   Recent Labs Lab 09/03/15 1304 09/04/15 0501  NA 137 130*  K 3.5 4.1  CL 97* 95*  CO2 32 26  GLUCOSE 126* 226*  BUN 13 12  CREATININE 0.72 0.73  CALCIUM 9.6 8.4*  MG 1.7  --   AST 19  --   ALT 14  --   ALKPHOS 18*  --   BILITOT 0.5  --    ------------------------------------------------------------------------------------------------------------------ estimated creatinine clearance is 50.9 mL/min (by C-G formula based on Cr of 0.73). ------------------------------------------------------------------------------------------------------------------  Recent Labs  09/03/15 1735  HGBA1C 6.0*    ------------------------------------------------------------------------------------------------------------------ No results for input(s): CHOL, HDL, LDLCALC, TRIG, CHOLHDL, LDLDIRECT in the last 72 hours. ------------------------------------------------------------------------------------------------------------------ No results for input(s): TSH, T4TOTAL, T3FREE, THYROIDAB in the last 72 hours.  Invalid input(s): FREET3 ------------------------------------------------------------------------------------------------------------------ No results for input(s): VITAMINB12, FOLATE, FERRITIN, TIBC, IRON, RETICCTPCT in the last 72 hours.  Coagulation profile No results for input(s): INR, PROTIME in the last 168 hours.  No results for input(s): DDIMER in the last 72 hours.  Cardiac Enzymes  Recent Labs Lab 09/03/15 1304  TROPONINI <0.03   ------------------------------------------------------------------------------------------------------------------ Invalid input(s): POCBNP   Time Spent in  minutes 35   SINGH,PRASHANT K M.D on 09/04/2015 at 10:55 AM  Between 7am to 7pm - Pager - [phone]  After 7pm go to [email or web address] - password [email or web address]  [facility] -  Office  [phone]

## 2015-09-04 NOTE — Evaluation (Signed)
Occupational Therapy Evaluation Patient Details Name: Jacqueline Cervantes MRN: [id number] DOB: 1935-06-25 Today's Date: 09/04/2015    History of Present Illness 80 y/o WF admitted with acute exacerbation of COPD.   Clinical Impression   Pt demonstrates decline in function and safety with ADLs and ADL mobility with decreased strength, balance and endurance. Pt would benefit from acute OT services to address impairments to increase level of function and safety    Follow Up Recommendations  Home health OT;Supervision - Intermittent    Equipment Recommendations   none   Recommendations for Other Services       Precautions / Restrictions Precautions Precautions: Fall Restrictions Weight Bearing Restrictions: No      Mobility Bed Mobility Overal bed mobility: Modified Independent                Transfers Overall transfer level: Needs assistance Equipment used: None Transfers: Sit to/from Stand;Stand Pivot Transfers Sit to Stand: Min guard Stand pivot transfers: Min guard            Balance Overall balance assessment: Needs assistance Sitting-balance support: Feet supported Sitting balance-Leahy Scale: Good     Standing balance support: No upper extremity supported;During functional activity Standing balance-Leahy Scale: Fair                              ADL Overall ADL's : Needs assistance/impaired     Grooming: Wash/dry hands;Wash/dry face;Standing;Min guard   Upper Body Bathing: Set up;Supervision/ safety;Sitting   Lower Body Bathing: Sit to/from stand;Sitting/lateral leans;Minimal assistance   Upper Body Dressing : Supervision/safety;Set up;Sitting   Lower Body Dressing: Minimal assistance;Sit to/from stand;Sitting/lateral leans   Toilet Transfer: Min guard;BSC;Ambulation   Toileting- Water quality scientist and Hygiene: Min guard;Sit to/from stand       Functional mobility during ADLs: Min guard       Vision Vision Assessment?:  No apparent visual deficits   Perception Perception Perception Tested?: No   Praxis Praxis Praxis tested?: Not tested    Pertinent Vitals/Pain Pain Assessment: No/denies pain     Hand Dominance Right   Extremity/Trunk Assessment Upper Extremity Assessment Upper Extremity Assessment: Generalized weakness;Overall [facility] for tasks assessed   Lower Extremity Assessment Lower Extremity Assessment: Defer to PT evaluation   Cervical / Trunk Assessment Cervical / Trunk Assessment: Normal   Communication Communication Communication: No difficulties   Cognition Arousal/Alertness: Awake/alert Behavior During Therapy: WFL for tasks assessed/performed Overall Cognitive Status: Within Functional Limits for tasks assessed                     General Comments   pt very pleasant and cooperative                 Home Living Family/patient expects to be discharged to:: Private residence Living Arrangements: Spouse/significant other;Children Available Help at Discharge: Family;Available 24 hours/day Type of Home: Mobile home Home Access: Ramped entrance     Home Layout: One level     Bathroom Shower/Tub: Tub/shower unit;Walk-in shower   Bathroom Toilet: Standard Bathroom Accessibility: Yes   Home Equipment: Cane - single point;Shower seat          Prior Functioning/Environment Level of Independence: Independent             OT Diagnosis: Generalized weakness   OT Problem List: Decreased strength;Decreased activity tolerance;Impaired balance (sitting and/or standing);Decreased knowledge of use of DME or AE   OT Treatment/Interventions: Self-care/ADL training;Patient/family education;Therapeutic exercise;Therapeutic  activities;DME and/or AE instruction    OT Goals(Current goals can be found in the care plan section) Acute Rehab OT Goals Patient Stated Goal: Feel better OT Goal Formulation: With patient Time For Goal Achievement: 09/11/15 Potential to  Achieve Goals: Good ADL Goals Pt Will Perform Grooming: with set-up;with supervision;standing Pt Will Perform Lower Body Bathing: with min guard assist;with supervision;with set-up;sitting/lateral leans;sit to/from stand Pt Will Perform Lower Body Dressing: with min guard assist;with supervision;with set-up;sitting/lateral leans;sit to/from stand Pt Will Transfer to Toilet: with supervision Pt Will Perform Toileting - Clothing Manipulation and hygiene: with supervision Additional ADL Goal #1: pt will verbalize and demo 3 energy conservation techniques during ADLs/functional tasks  OT Frequency: Min 2X/week   Barriers to D/C:    none                     End of Session Equipment Utilized During Treatment: Gait belt;Other (comment) (BSC)  Activity Tolerance: Patient tolerated treatment well Patient left: in bed   Time: 2334-3568 OT Time Calculation (min): 23 min Charges:  OT General Charges $OT Visit: 1 Procedure OT Evaluation $Initial OT Evaluation Tier I: 1 Procedure OT Treatments $Therapeutic Activity: 8-22 mins G-Codes:    Britt Bottom 09/04/2015, 12:46 PM

## 2015-09-05 LAB — GLUCOSE, CAPILLARY
GLUCOSE-CAPILLARY: 169 mg/dL — AB (ref 65–99)
GLUCOSE-CAPILLARY: 139 mg/dL — AB (ref 65–99)

## 2015-09-05 LAB — URINE CULTURE

## 2015-09-05 LAB — BASIC METABOLIC PANEL
ANION GAP: 8 (ref 5–15)
BUN: 19 mg/dL (ref 6–20)
CHLORIDE: 96 mmol/L — AB (ref 101–111)
CO2: 28 mmol/L (ref 22–32)
Calcium: 8.7 mg/dL — ABNORMAL LOW (ref 8.9–10.3)
Creatinine, Ser: 0.9 mg/dL (ref 0.44–1.00)
GFR calc non Af Amer: >60 mL/min (ref >60)
Glucose, Bld: 243 mg/dL — ABNORMAL HIGH (ref 65–99)
POTASSIUM: 3.9 mmol/L (ref 3.5–5.1)
SODIUM: 132 mmol/L — AB (ref 135–145)

## 2015-09-05 MED ORDER — ALBUTEROL SULFATE (2.5 MG/3ML) 0.083% IN NEBU: 2.5000 mg | INHALATION_SOLUTION | Freq: Four times a day (QID) | RESPIRATORY_TRACT | Status: DC | PRN

## 2015-09-05 MED ORDER — PREDNISONE 5 MG PO TABS: ORAL_TABLET | ORAL | Status: DC

## 2015-09-05 MED ORDER — LEVOFLOXACIN 500 MG PO TABS: 500.0000 mg | ORAL_TABLET | Freq: Every day | ORAL | Status: DC

## 2015-09-05 MED ORDER — TIOTROPIUM BROMIDE MONOHYDRATE 18 MCG IN CAPS: 18.0000 ug | ORAL_CAPSULE | Freq: Every day | RESPIRATORY_TRACT | Status: DC

## 2015-09-05 MED ORDER — LEVOFLOXACIN 500 MG PO TABS: 500.0000 mg | ORAL_TABLET | ORAL | Status: DC

## 2015-09-05 NOTE — Progress Notes (Signed)
Discharge instructions given. Pt verbalized understanding and all questions were answered.  

## 2015-09-05 NOTE — Discharge Instructions (Signed)
Follow with Primary MD [name], FNP in 7 days   Get CBC, CMP, 2 view Chest X ray checked  by Primary MD next visit.    Activity: As tolerated with Full fall precautions use walker/cane & assistance as needed   Disposition Home    Diet: Heart Healthy Low Carb.  For Heart failure patients - Check your Weight same time everyday, if you gain over 2 pounds, or you develop in leg swelling, experience more shortness of breath or chest pain, call your Primary MD immediately. Follow Cardiac Low Salt Diet and 1.5 lit/day fluid restriction.   On your next visit with your primary care physician please Get Medicines reviewed and adjusted.   Please request your Prim.MD to go over all Hospital Tests and Procedure/Radiological results at the follow up, please get all Hospital records sent to your Prim MD by signing hospital release before you go home.   If you experience worsening of your admission symptoms, develop shortness of breath, life threatening emergency, suicidal or homicidal thoughts you must seek medical attention immediately by calling 911 or calling your MD immediately  if symptoms less severe.  You Must read complete instructions/literature along with all the possible adverse reactions/side effects for all the Medicines you take and that have been prescribed to you. Take any new Medicines after you have completely understood and accpet all the possible adverse reactions/side effects.   Do not drive, operating heavy machinery, perform activities at heights, swimming or participation in water activities or provide baby sitting services if your were admitted for syncope or siezures until you have seen by Primary MD or a Neurologist and advised to do so again.  Do not drive when taking Pain medications.    Do not take more than prescribed Pain, Sleep and Anxiety Medications  Special Instructions: If you have smoked or chewed Tobacco  in the last 2 yrs please stop smoking,  stop any regular Alcohol  and or any Recreational drug use.  Wear Seat belts while driving.   Please note  You were cared for by a hospitalist during your hospital stay. If you have any questions about your discharge medications or the care you received while you were in the hospital after you are discharged, you can call the unit and asked to speak with the hospitalist on call if the hospitalist that took care of you is not available. Once you are discharged, your primary care physician will handle any further medical issues. Please note that NO REFILLS for any discharge medications will be authorized once you are discharged, as it is imperative that you return to your primary care physician (or establish a relationship with a primary care physician if you do not have one) for your aftercare needs so that they can reassess your need for medications and monitor your lab values.

## 2015-09-05 NOTE — Discharge Summary (Signed)
Jacqueline Cervantes, is a 80 y.o. female  DOB 1935-04-17  MRN [id number].  Admission date:  09/03/2015  Admitting Physician  Eugenie Filler, MD  Discharge Date:  09/05/2015   Primary MD  [name], FNP  Recommendations for primary care physician for things to follow:   Check CBC, BMP, 2 view chest x-ray in a week. Outpatient pulmonary follow-up.   Admission Diagnosis  sob   Discharge Diagnosis  sob     Principal Problem:   COPD with acute exacerbation Active Problems:   HTN (hypertension)   HLD (hyperlipidemia)   DM (diabetes mellitus)   Osteoporosis   Tobacco user   Generalized anxiety disorder   COPD with chronic bronchitis   COPD exacerbation      Past Medical History  Diagnosis Date  . Hypercholesteremia   . Hypertension   . GERD (gastroesophageal reflux disease)   . Cancer of left breast   . Skin cancer     "face/nose"  . Type II diabetes mellitus   . COPD with acute exacerbation     /notes 09/03/2015  . COPD with chronic bronchitis     Archie Endo 09/03/2015  . Arthritis     "fingers" (09/03/2015)  . Anxiety   . Depression   . On home oxygen therapy     "I use my husband's oxygen 2-3 times/day; 3L" (09/03/2015)    Past Surgical History  Procedure Laterality Date  . Breast biopsy Left   . Mastectomy Left 05/11/2006  . Abdominal hysterectomy    . Dilation and curettage of uterus    . Cataract extraction, bilateral Bilateral   . Skin cancer excision      "cut it off my nose; went right down the middle, on top"       HPI  from the history and physical done on the day of admission:    Jacqueline Cervantes is a 80 y.o. female with history of diabetes mellitus, hypertension, gastroesophageal reflux disease, ongoing tobacco abuse presenting to the ED with several days of worsening shortness  of breath, nonproductive cough, wheezing. Patient's daughter had come to patient's home and noted that patient was hypoxic with sats in the 58s per son-in-law. Patient subsequently presented to PCPs office where she was sent to the ED due to shortness of breath hypoxia and wheezing. On route via EMS patient was given some supplemental oxygen, IV Solu-Medrol, DuoNeb's with clinical improvement. Patient denies fever, no chills, no nausea, no vomiting, no chest pain, no abdominal pain, no dysuria, no constipation, no diarrhea, no melanotic, no hematemesis, no hematochezia, no visual changes. Patient does endorse some generalized weakness and soft stools. Patient stated she started using her husband's oxygen due to her shortness of breath and hypoxia.  Patient was seen in the ED given some DuoNeb's with continued improvement in his symptoms. [facility] hospitalists were called to admit the patient for further evaluation and management. In the ED, ABG obtained had a pH of 7.369, PCO2 of 52, PO2 of 67, bicarbonate of 30.  Comprehensive metabolic profile with a chloride of 97 glucose of 126 alk phosphatase of 18 otherwise was within normal limits. BNP was 109.2. First set of troponin was negative. CBC was unremarkable. Chest x-ray is negative for any acute abnormalities. EKG showed QTC prolongation and LVH with repolarization.     Hospital Course:     1. Acute on chronic hypoxic respiratory failure secondary to COPD exacerbation. Ongoing smoking counseled to quit, improved after Levaquin, IV steroids, oxygen as needed, nebulizer treatments both scheduled and as needed. She is feeling a whole lot better but has qualified for home oxygen which she will get, will be placed on steroids taper along with 4 more days of Levaquin orally. Have provided her with nebulizer machine, albuterol neb treatments, as been placed on Spiriva, requested to follow with PCP and pulmonologist post discharge.   2. Smoker. Counseled to quit,  she appears motivated.   3. Dyslipidemia. Continue home dose statin and TriCor.   4. Essential hypertension. Continue on Norvasc and monitor blood pressure.   5. GERD. On PPI continue.   6. Anxiety and depression. Continue low-dose benzodiazepine along with Celexa.   7. Hyponatremia. Mild SIADH much improved with fluid restriction. Pedal BMP per PCP in a week.   8. DM type II. Continue home regimen upon discharge follow-up with PCP.       Discharge Condition: Fair  Follow UP  Follow-up Information    Follow up with [name], FNP. Schedule an appointment as soon as possible for a visit in 1 week.   Specialty:  Nurse Practitioner   Contact information:   [place] [address] [phone]       Follow up with [facility], MD. Schedule an appointment as soon as possible for a visit in 1 week.   Specialty:  Pulmonary Disease   Why:  COPD   Contact information:   [place] [address] [phone]        Consults obtained - None  Diet and Activity recommendation: See Discharge Instructions below  Discharge Instructions       Discharge Instructions    Discharge instructions    Complete by:  As directed   Follow with Primary MD [name], FNP in 7 days   Get CBC, CMP, 2 view Chest X ray checked  by Primary MD next visit.    Activity: As tolerated with Full fall precautions use walker/cane & assistance as needed   Disposition Home    Diet: Heart Healthy Low Carb.  For Heart failure patients - Check your Weight same time everyday, if you gain over 2 pounds, or you develop in leg swelling, experience more shortness of breath or chest pain, call your Primary MD immediately. Follow Cardiac Low Salt Diet and 1.5 lit/day fluid restriction.   On your next visit with your primary care physician please Get Medicines reviewed and adjusted.   Please request your Prim.MD to go over all Hospital Tests  and Procedure/Radiological results at the follow up, please get all Hospital records sent to your Prim MD by signing hospital release before you go home.   If you experience worsening of your admission symptoms, develop shortness of breath, life threatening emergency, suicidal or homicidal thoughts you must seek medical attention immediately by calling 911 or calling your MD immediately  if symptoms less severe.  You Must read complete instructions/literature along with all the possible adverse reactions/side effects for all the Medicines you take and that have been  prescribed to you. Take any new Medicines after you have completely understood and accpet all the possible adverse reactions/side effects.   Do not drive, operating heavy machinery, perform activities at heights, swimming or participation in water activities or provide baby sitting services if your were admitted for syncope or siezures until you have seen by Primary MD or a Neurologist and advised to do so again.  Do not drive when taking Pain medications.    Do not take more than prescribed Pain, Sleep and Anxiety Medications  Special Instructions: If you have smoked or chewed Tobacco  in the last 2 yrs please stop smoking, stop any regular Alcohol  and or any Recreational drug use.  Wear Seat belts while driving.   Please note  You were cared for by a hospitalist during your hospital stay. If you have any questions about your discharge medications or the care you received while you were in the hospital after you are discharged, you can call the unit and asked to speak with the hospitalist on call if the hospitalist that took care of you is not available. Once you are discharged, your primary care physician will handle any further medical issues. Please note that NO REFILLS for any discharge medications will be authorized once you are discharged, as it is imperative that you return to your primary care physician (or establish a  relationship with a primary care physician if you do not have one) for your aftercare needs so that they can reassess your need for medications and monitor your lab values.     Increase activity slowly    Complete by:  As directed              Discharge Medications       Medication List    TAKE these medications        albuterol 108 (90 BASE) MCG/ACT inhaler  Commonly known as:  PROVENTIL HFA;VENTOLIN HFA  Inhale 2 puffs into the lungs every 6 (six) hours as needed for wheezing or shortness of breath.     albuterol (2.5 MG/3ML) 0.083% nebulizer solution  Commonly known as:  PROVENTIL  Take 3 mLs (2.5 mg total) by nebulization every 6 (six) hours as needed for wheezing or shortness of breath.     alendronate 70 MG tablet  Commonly known as:  FOSAMAX  TAKE 1 TABLET BY MOUTH EVERY WEEK WITH A FULL GLASS OF WATER ON AN EMPTY STOMACH ON FRIDAYS     ALPRAZolam 0.5 MG tablet  Commonly known as:  XANAX  TAKE 1 TABLET BY MOUTH TWO TIMES A DAY     amLODipine 10 MG tablet  Commonly known as:  NORVASC  Take 1 tablet (10 mg total) by mouth daily.     atorvastatin 40 MG tablet  Commonly known as:  LIPITOR  Take 1 tablet (40 mg total) by mouth daily.     budesonide-formoterol 80-4.5 MCG/ACT inhaler  Commonly known as:  SYMBICORT  Inhale 2 puffs into the lungs 2 (two) times daily.     CALCIUM 600+D 600-400 MG-UNIT per tablet  Generic drug:  Calcium Carbonate-Vitamin D  Take 1 tablet by mouth daily.     citalopram 20 MG tablet  Commonly known as:  CELEXA  TAKE 1 TABLET EVERY DAY     fenofibrate 160 MG tablet  Take 1 tablet (160 mg total) by mouth daily.     FISH OIL PO  Take 1 capsule by mouth daily.     hydrochlorothiazide 12.5  MG capsule  Commonly known as:  MICROZIDE  Take 1 capsule (12.5 mg total) by mouth daily.     levofloxacin 500 MG tablet  Commonly known as:  LEVAQUIN  Take 1 tablet (500 mg total) by mouth daily.     lisinopril 40 MG tablet  Commonly known  as:  PRINIVIL,ZESTRIL  TAKE 1 TABLET (40 MG TOTAL) BY MOUTH DAILY.     metFORMIN 500 MG tablet  Commonly known as:  GLUCOPHAGE  Take 1-2 tablets (500-1,000 mg total) by mouth 2 (two) times daily with a meal. Takes 1000 mg in the morning and 500 mg in the evening.     polyethylene glycol packet  Commonly known as:  MIRALAX / GLYCOLAX  Take 17 g by mouth daily.     predniSONE 5 MG tablet  Commonly known as:  DELTASONE  Label  & dispense according to the schedule below. 10 Pills PO for 3 days then, 8 Pills PO for 3 days, 6 Pills PO for 3 days, 4 Pills PO for 3 days, 2 Pills PO for 3 days, 1 Pills PO for 3 days, 1/2 Pill  PO for 3 days then STOP. Total 95 pills.     tiotropium 18 MCG inhalation capsule  Commonly known as:  SPIRIVA HANDIHALER  Place 1 capsule (18 mcg total) into inhaler and inhale daily.        Major procedures and Radiology Reports - PLEASE review detailed and final reports for all details, in brief -       Dg Chest 2 View  09/03/2015   CLINICAL DATA:  Shortness of breath.  EXAM: CHEST  2 VIEW  COMPARISON:  Chest x-ray dated 08/16/2011 and report of CT scan of the chest dated 08/16/2011  FINDINGS: Heart size and pulmonary vascularity are normal. Extensive calcification in the aorta. Old compression deformities of T8 and L1. Lungs are clear.  IMPRESSION: No active cardiopulmonary disease.   Electronically Signed   By: Lorriane Shire M.D.   On: 09/03/2015 14:25    Micro Results      Recent Results (from the past 240 hour(s))  Urine culture     Status: None   Collection Time: 09/04/15  2:40 AM  Result Value Ref Range Status   Specimen Description URINE, RANDOM  Final   Special Requests NONE  Final   Culture MULTIPLE SPECIES PRESENT, SUGGEST RECOLLECTION  Final   Report Status 09/05/2015 FINAL  Final       Today   Subjective    Reece Agar today has no headache,no chest abdominal pain,no new weakness tingling or numbness, feels much better wants to go  home today.     Objective   Blood pressure 114/57, pulse 88, temperature 98.8 F (37.1 C), temperature source Oral, resp. rate 18, height '5\' 4"'  (1.626 m), weight 59.194 kg (130 lb 8 oz), SpO2 96 %.   Intake/Output Summary (Last 24 hours) at 09/05/15 0952 Last data filed at 09/05/15 0825  Gross per 24 hour  Intake    820 ml  Output   1452 ml  Net   -632 ml    Exam Awake Alert, Oriented x 3, No new F.N deficits, Normal affect [place].AT,PERRAL Supple Neck,No JVD, No cervical lymphadenopathy appriciated.  Symmetrical Chest wall movement, Good air movement bilaterally, mild wheezing RRR,No Gallops,Rubs or new Murmurs, No Parasternal Heave +ve B.Sounds, Abd Soft, Non tender, No organomegaly appriciated, No rebound -guarding or rigidity. No Cyanosis, Clubbing or edema, No new Rash or bruise  Data Review   CBC w Diff: Lab Results  Component Value Date   WBC 7.7 09/04/2015   WBC 8.3 04/06/2015   WBC 8.6 08/02/2013   HGB 12.2 09/04/2015   HGB 14.4 04/06/2015   HGB 13.9 08/02/2013   HCT 37.0 09/04/2015   HCT 44.6 04/06/2015   HCT 41.4 08/02/2013   PLT 276 09/04/2015   PLT 317 04/06/2015   LYMPHOPCT 44 09/03/2015   LYMPHOPCT 28.0 04/06/2015   MONOPCT 7 09/03/2015   MONOPCT 7.8 04/06/2015   EOSPCT 2 09/03/2015   EOSPCT 1.2 04/06/2015   BASOPCT 1 09/03/2015   BASOPCT 1.1 04/06/2015    CMP: Lab Results  Component Value Date   NA 132* 09/05/2015   NA 127* 08/20/2015   NA 138 04/06/2015   K 3.9 09/05/2015   K 4.1 04/06/2015   CL 96* 09/05/2015   CL 92* 02/28/2013   CO2 28 09/05/2015   CO2 32* 04/06/2015   BUN 19 09/05/2015   BUN 14 08/20/2015   BUN 12.8 04/06/2015   CREATININE 0.90 09/05/2015   CREATININE 0.8 04/06/2015   CREATININE 0.76 05/09/2013   PROT 6.8 09/03/2015   PROT 7.1 08/20/2015   PROT 7.4 04/06/2015   ALBUMIN 3.9 09/03/2015   ALBUMIN 4.0 04/06/2015   BILITOT 0.5 09/03/2015   BILITOT 0.3 08/20/2015   BILITOT 0.41 04/06/2015   ALKPHOS 18* 09/03/2015     ALKPHOS 26* 04/06/2015   AST 19 09/03/2015   AST 13 04/06/2015   ALT 14 09/03/2015   ALT 10 04/06/2015  .   Total Time in preparing paper work, data evaluation and todays exam - 35 minutes  Thurnell Lose M.D on 09/05/2015 at 9:52 AM  [facility]   Office  [phone]

## 2015-09-05 NOTE — Progress Notes (Signed)
SATURATION QUALIFICATIONS:  Patient Saturations on Room Air at Rest =91%  Patient Saturations on Room Air while Ambulating 85%   Patient Saturations on 2 Liters of oxygen while Ambulating = 90% :

## 2015-09-05 NOTE — Care Management Note (Signed)
Case Management Note  Patient Details  Name: Jacqueline Cervantes MRN: [id number] Date of Birth: 1935/03/16  Subjective/Objective:                    Action/Plan:   Expected Discharge Date:                  Expected Discharge Plan:  [place]  In-House Referral:     Discharge planning Services  CM Consult  Post Acute Care Choice:  NA Choice offered to:  NA  DME Arranged:  Nebulizer machine, Oxygen DME Agency:  [place]:    [facility] Agency:     Status of Service:  Completed, signed off  Medicare Important Message Given:    Date Medicare IM Given:    Medicare IM give by:    Date Additional Medicare IM Given:    Additional Medicare Important Message give by:     If discussed at [place] of Stay Meetings, dates discussed:    Additional Comments: CM called [facility] DME rep, Merry Proud to please deliver the home oxygen and nebulizer machine to room so pt can discharge.  No other CM needs were communicated. Dellie Catholic, RN 09/05/2015, 10:42 AM

## 2015-09-09 ENCOUNTER — Telehealth: Payer: Self-pay | Admitting: Nurse Practitioner

## 2015-09-10 NOTE — Telephone Encounter (Signed)
Appointment given 9/26 with Dr. Livia Snellen since Ronnald Collum, FNP is on vacation that week.

## 2015-09-21 ENCOUNTER — Ambulatory Visit (INDEPENDENT_AMBULATORY_CARE_PROVIDER_SITE_OTHER): Payer: Medicare Other

## 2015-09-21 ENCOUNTER — Ambulatory Visit (INDEPENDENT_AMBULATORY_CARE_PROVIDER_SITE_OTHER): Payer: Medicare Other | Admitting: Family Medicine

## 2015-09-21 ENCOUNTER — Encounter: Payer: Self-pay | Admitting: Family Medicine

## 2015-09-21 VITALS — BP 120/61 | HR 81 | Temp 98.8°F | Ht 64.0 in | Wt 133.4 lb

## 2015-09-21 DIAGNOSIS — I10 Essential (primary) hypertension: Secondary | ICD-10-CM | POA: Diagnosis not present

## 2015-09-21 DIAGNOSIS — J4489 Other specified chronic obstructive pulmonary disease: Secondary | ICD-10-CM

## 2015-09-21 DIAGNOSIS — Z720 Tobacco use: Secondary | ICD-10-CM

## 2015-09-21 DIAGNOSIS — J42 Unspecified chronic bronchitis: Secondary | ICD-10-CM | POA: Diagnosis not present

## 2015-09-21 DIAGNOSIS — J449 Chronic obstructive pulmonary disease, unspecified: Secondary | ICD-10-CM | POA: Diagnosis not present

## 2015-09-21 DIAGNOSIS — J441 Chronic obstructive pulmonary disease with (acute) exacerbation: Secondary | ICD-10-CM

## 2015-09-21 NOTE — Progress Notes (Signed)
Subjective:  Patient ID: [name], female    DOB: [birth year]  Age: 80 y.o. MRN: [id number]  CC: Hospitalization Follow-up and Injury   HPI AIRIKA ALKHATIB presents for fall. She landed on her right flank yesterday. She has minimal shortness of breath unless exerting herself more than 4 ADLs.. She did not lose consciousness. She was trying to get in the shower and fell over the shower chair that her husband uses. This did not change her breathing to make her more short of breath. She does have some mild pain at the site of impact in the right lower back to flank area. She was hospitalized for 3 days earlier this month for pneumonia and exacerbation of COPD.[facility] report was reviewed and is attached. She has finished her antibiotic, levofloxacin. She continues on multiple inhalers and nebulizer treatments as noted below. She was also placed on a prednisone taper. She should be down to 1 tablet daily now and about to go to one half tablet. She continues to have minimal wheezing and dyspnea.   follow-up of hypertension. Patient has no history of headache chest pain or shortness of breath or recent cough. Patient also denies symptoms of TIA such as numbness weakness lateralizing. Patient checks  blood pressure at home and has not had any elevated readings recently. Patient denies side effects from his medication. States taking it regularly.   History Meron has a past medical history of Hypercholesteremia; Hypertension; GERD (gastroesophageal reflux disease); Cancer of left breast; Skin cancer; Type II diabetes mellitus; COPD with acute exacerbation; COPD with chronic bronchitis; Arthritis; Anxiety; Depression; and On home oxygen therapy.   She has past surgical history that includes Breast biopsy (Left); Mastectomy (Left, 05/11/2006); Abdominal hysterectomy; Dilation and curettage of uterus; Cataract extraction, bilateral (Bilateral); and Skin cancer excision.   Her family history includes  Cancer in her father; Diabetes in her mother.She reports that she has been smoking Cigarettes.  She has a 26 pack-year smoking history. She has never used smokeless tobacco. She reports that she does not drink alcohol or use illicit drugs.  Outpatient Prescriptions Prior to Visit  Medication Sig Dispense Refill  . albuterol (PROVENTIL HFA;VENTOLIN HFA) 108 (90 BASE) MCG/ACT inhaler Inhale 2 puffs into the lungs every 6 (six) hours as needed for wheezing or shortness of breath. 1 Inhaler 3  . albuterol (PROVENTIL) (2.5 MG/3ML) 0.083% nebulizer solution Take 3 mLs (2.5 mg total) by nebulization every 6 (six) hours as needed for wheezing or shortness of breath. 75 mL 12  . alendronate (FOSAMAX) 70 MG tablet TAKE 1 TABLET BY MOUTH EVERY WEEK WITH A FULL GLASS OF WATER ON AN EMPTY STOMACH ON FRIDAYS 12 tablet 3  . ALPRAZolam (XANAX) 0.5 MG tablet TAKE 1 TABLET BY MOUTH TWO TIMES A DAY 60 tablet 1  . amLODipine (NORVASC) 10 MG tablet Take 1 tablet (10 mg total) by mouth daily. 90 tablet 3  . atorvastatin (LIPITOR) 40 MG tablet Take 1 tablet (40 mg total) by mouth daily. 90 tablet 3  . budesonide-formoterol (SYMBICORT) 80-4.5 MCG/ACT inhaler Inhale 2 puffs into the lungs 2 (two) times daily. 1 Inhaler 3  . Calcium Carbonate-Vitamin D (CALCIUM 600+D) 600-400 MG-UNIT per tablet Take 1 tablet by mouth daily.      . citalopram (CELEXA) 20 MG tablet TAKE 1 TABLET EVERY DAY 90 tablet 0  . fenofibrate 160 MG tablet Take 1 tablet (160 mg total) by mouth daily. 90 tablet 3  . hydrochlorothiazide (MICROZIDE) 12.5 MG  capsule Take 1 capsule (12.5 mg total) by mouth daily. 90 capsule 3  . lisinopril (PRINIVIL,ZESTRIL) 40 MG tablet TAKE 1 TABLET (40 MG TOTAL) BY MOUTH DAILY. 90 tablet 1  . metFORMIN (GLUCOPHAGE) 500 MG tablet Take 1-2 tablets (500-1,000 mg total) by mouth 2 (two) times daily with a meal. Takes 1000 mg in the morning and 500 mg in the evening. 180 tablet 3  . Omega-3 Fatty Acids (FISH OIL PO) Take 1  capsule by mouth daily.    . polyethylene glycol (MIRALAX / GLYCOLAX) packet Take 17 g by mouth daily.    . predniSONE (DELTASONE) 5 MG tablet Label  & dispense according to the schedule below. 10 Pills PO for 3 days then, 8 Pills PO for 3 days, 6 Pills PO for 3 days, 4 Pills PO for 3 days, 2 Pills PO for 3 days, 1 Pills PO for 3 days, 1/2 Pill  PO for 3 days then STOP. Total 95 pills. 95 tablet 0  . tiotropium (SPIRIVA HANDIHALER) 18 MCG inhalation capsule Place 1 capsule (18 mcg total) into inhaler and inhale daily. 30 capsule 0  . levofloxacin (LEVAQUIN) 500 MG tablet Take 1 tablet (500 mg total) by mouth daily. (Patient not taking: Reported on 09/21/2015) 4 tablet 0   No facility-administered medications prior to visit.    ROS Review of Systems  Constitutional: Negative for fever, chills, diaphoresis, appetite change, fatigue and unexpected weight change.  HENT: Negative for congestion, ear pain, hearing loss, postnasal drip, rhinorrhea, sneezing, sore throat and trouble swallowing.   Eyes: Negative for pain.  Respiratory: Positive for cough and shortness of breath. Negative for chest tightness and wheezing.        See history of present illness  Cardiovascular: Negative for chest pain, palpitations and leg swelling.  Gastrointestinal: Negative for nausea, vomiting, abdominal pain, diarrhea and constipation.  Genitourinary: Negative for dysuria, frequency and menstrual problem.  Musculoskeletal: Positive for myalgias and arthralgias. Negative for joint swelling.       See history of present illness  Skin: Negative for rash.  Neurological: Negative for dizziness, weakness, numbness and headaches.  Psychiatric/Behavioral: Negative for dysphoric mood and agitation.    Objective:  BP 120/61 mmHg  Pulse 81  Temp(Src) 98.8 F (37.1 C) (Oral)  Ht '5\' 4"'  (1.626 m)  Wt 133 lb 6.4 oz (60.51 kg)  BMI 22.89 kg/m2  SpO2 94%  BP Readings from Last 3 Encounters:  09/21/15 120/61  09/05/15  139/66  09/03/15 133/75    Wt Readings from Last 3 Encounters:  09/21/15 133 lb 6.4 oz (60.51 kg)  09/05/15 130 lb 8 oz (59.194 kg)  09/03/15 128 lb (58.06 kg)     Physical Exam  Constitutional: She is oriented to person, place, and time. She appears well-developed and well-nourished. No distress.  HENT:  Head: Normocephalic and atraumatic.  Right Ear: External ear normal.  Left Ear: External ear normal.  Nose: Nose normal.  Mouth/Throat: Oropharynx is clear and moist.  Eyes: Conjunctivae and EOM are normal. Pupils are equal, round, and reactive to light.  Neck: Normal range of motion. Neck supple. No thyromegaly present.  Cardiovascular: Normal rate, regular rhythm and normal heart sounds.   No murmur heard. Pulmonary/Chest: Effort normal. No respiratory distress. She has wheezes. She has no rales.  She was able to walk from the car to the office and exam room slowly with minimal dyspnea. Her home oxygen is just too heavy for her to carry with her.  Abdominal:  Soft. Bowel sounds are normal. She exhibits no distension. There is no tenderness.  Musculoskeletal: Normal range of motion. She exhibits tenderness (mild tenderness at the right lower back just inferior and lateral to the angle of the scapula. There is full range of motion of the right upper extremity and the lungs do not have a rub in this area.). She exhibits no edema.  Lymphadenopathy:    She has no cervical adenopathy.  Neurological: She is alert and oriented to person, place, and time. She has normal reflexes.  Skin: Skin is warm and dry.  Psychiatric: She has a normal mood and affect.    Lab Results  Component Value Date   HGBA1C 6.0* 09/03/2015   HGBA1C 5.8 08/20/2015   HGBA1C 5.6 05/15/2015    Lab Results  Component Value Date   WBC 7.7 09/04/2015   HGB 12.2 09/04/2015   HCT 37.0 09/04/2015   PLT 276 09/04/2015   GLUCOSE 243* 09/05/2015   CHOL 117 08/20/2015   TRIG 110 08/20/2015   HDL 50 08/20/2015     LDLCALC 45 08/20/2015   ALT 14 09/03/2015   AST 19 09/03/2015   NA 132* 09/05/2015   K 3.9 09/05/2015   CL 96* 09/05/2015   CREATININE 0.90 09/05/2015   BUN 19 09/05/2015   CO2 28 09/05/2015   HGBA1C 6.0* 09/03/2015    Dg Chest 2 View  09/03/2015   CLINICAL DATA:  Shortness of breath.  EXAM: CHEST  2 VIEW  COMPARISON:  Chest x-ray dated 08/16/2011 and report of CT scan of the chest dated 08/16/2011  FINDINGS: Heart size and pulmonary vascularity are normal. Extensive calcification in the aorta. Old compression deformities of T8 and L1. Lungs are clear.  IMPRESSION: No active cardiopulmonary disease.   Electronically Signed   By: Lorriane Shire M.D.   On: 09/03/2015 14:25    Assessment & Plan:   Sheronica was seen today for hospitalization follow-up and injury.  Diagnoses and all orders for this visit:  Chronic bronchitis, unspecified chronic bronchitis type -     DG Chest 2 View -     CBC with Differential/Platelet -     CMP14+EGFR  COPD with chronic bronchitis  COPD exacerbation  Essential hypertension  Tobacco user   I am having Ms. Beadnell maintain her Calcium Carbonate-Vitamin D, Omega-3 Fatty Acids (FISH OIL PO), polyethylene glycol, albuterol, citalopram, lisinopril, metFORMIN, amLODipine, fenofibrate, atorvastatin, alendronate, ALPRAZolam, budesonide-formoterol, hydrochlorothiazide, albuterol, tiotropium, levofloxacin, and predniSONE.  No orders of the defined types were placed in this encounter.     Follow-up: Return in about 6 weeks (around 11/02/2015) for COPD.  Claretta Fraise, M.D.

## 2015-09-23 DIAGNOSIS — J441 Chronic obstructive pulmonary disease with (acute) exacerbation: Secondary | ICD-10-CM | POA: Diagnosis not present

## 2015-09-25 ENCOUNTER — Other Ambulatory Visit: Payer: Medicare Other

## 2015-09-25 ENCOUNTER — Encounter: Payer: Self-pay | Admitting: Internal Medicine

## 2015-09-25 ENCOUNTER — Ambulatory Visit (INDEPENDENT_AMBULATORY_CARE_PROVIDER_SITE_OTHER): Payer: Medicare Other | Admitting: Internal Medicine

## 2015-09-25 VITALS — BP 146/60 | HR 91 | Ht 64.0 in | Wt 133.0 lb

## 2015-09-25 DIAGNOSIS — J449 Chronic obstructive pulmonary disease, unspecified: Secondary | ICD-10-CM

## 2015-09-25 DIAGNOSIS — J441 Chronic obstructive pulmonary disease with (acute) exacerbation: Secondary | ICD-10-CM

## 2015-09-25 MED ORDER — PREDNISONE 10 MG PO TABS: ORAL_TABLET | ORAL | Status: DC

## 2015-09-25 MED ORDER — IPRATROPIUM-ALBUTEROL 0.5-2.5 (3) MG/3ML IN SOLN: 3.0000 mL | Freq: Four times a day (QID) | RESPIRATORY_TRACT | Status: DC | PRN

## 2015-09-25 MED ORDER — FLUTICASONE PROPIONATE HFA 110 MCG/ACT IN AERO: 2.0000 | INHALATION_SPRAY | Freq: Two times a day (BID) | RESPIRATORY_TRACT | Status: DC

## 2015-09-25 MED ORDER — LOSARTAN POTASSIUM 25 MG PO TABS: 25.0000 mg | ORAL_TABLET | Freq: Every day | ORAL | Status: DC

## 2015-09-25 NOTE — Progress Notes (Signed)
Subjective:    Patient ID: Jacqueline Cervantes, female    DOB: 1935-05-23, 80 y.o.   MRN: [id number] PCP [name], FNP  HPI  IOV 09/25/2015  Chief Complaint  Patient presents with  . Pulmonary Consult    Pt recently d/c from [facility] for COPD. Pt denies SOB and CP/tightness. Pt c/o mild prod cough with yellow mucus and wheezing.      80 year old female accompanied by her daughter. At baseline she has mild cough and mild shortness of breath. She is a very poor historian. Severity duration all unknown. Aggravated by  exertion. Relieved by rest. Insidious onset probably some many years ago. Associated cough is mild. Then 09/03/2015 was admitted for respiratory worsening. Was given a diagnosis of COPD exacerbation. Discharged 09/05/2015. This was the first time she was given a COPD diagnosis. She was discharged on oxygen. Her daughter who is here with her today states that there is a strong family history of COPD and many members are on oxygen. She is puzzled that her mother might have COPD because at rest on room air even after discharge saturations are fine and apparently nobody is given a diagnosis of COPD up until current admission. Currently patient's on Spiriva and Symbicort which she might not be taking correctly because of expensive cost. She is also on oxygen and compliance is unknown. She continues to smoke in interest in quitting smoking.  Patient's daughter and husband have COPD  Chest x-ray September 2016 done in the hospital but I personally visualized shows COPD    Normal serum creatinine summer 10 2016  ABG September 2016 shows mild hypercapnia with pH 7.3 6752 and a PO2 of 67     has a past medical history of Hypercholesteremia; Hypertension; GERD (gastroesophageal reflux disease); Cancer of left breast; Skin cancer; Type II diabetes mellitus; COPD with acute exacerbation; COPD with chronic bronchitis; Arthritis; Anxiety; Depression; and On home oxygen therapy.   reports  that she has been smoking Cigarettes.  She has a 26 pack-year smoking history. She has never used smokeless tobacco.  Past Surgical History  Procedure Laterality Date  . Breast biopsy Left   . Mastectomy Left 05/11/2006  . Abdominal hysterectomy    . Dilation and curettage of uterus    . Cataract extraction, bilateral Bilateral   . Skin cancer excision      "cut it off my nose; went right down the middle, on top"    No Known Allergies  Immunization History  Administered Date(s) Administered  . Influenza,inj,Quad PF,36+ Mos 11/01/2013, 10/16/2014, 09/04/2015  . Pneumococcal Conjugate-13 05/15/2015    Family History  Problem Relation Age of Onset  . Diabetes Mother   . Cancer Father     unknown     Current outpatient prescriptions:  .  albuterol (PROVENTIL HFA;VENTOLIN HFA) 108 (90 BASE) MCG/ACT inhaler, Inhale 2 puffs into the lungs every 6 (six) hours as needed for wheezing or shortness of breath., Disp: 1 Inhaler, Rfl: 3 .  albuterol (PROVENTIL) (2.5 MG/3ML) 0.083% nebulizer solution, Take 3 mLs (2.5 mg total) by nebulization every 6 (six) hours as needed for wheezing or shortness of breath., Disp: 75 mL, Rfl: 12 .  alendronate (FOSAMAX) 70 MG tablet, TAKE 1 TABLET BY MOUTH EVERY WEEK WITH A FULL GLASS OF WATER ON AN EMPTY STOMACH ON FRIDAYS, Disp: 12 tablet, Rfl: 3 .  ALPRAZolam (XANAX) 0.5 MG tablet, TAKE 1 TABLET BY MOUTH TWO TIMES A DAY, Disp: 60 tablet, Rfl: 1 .  amLODipine (  NORVASC) 10 MG tablet, Take 1 tablet (10 mg total) by mouth daily., Disp: 90 tablet, Rfl: 3 .  atorvastatin (LIPITOR) 40 MG tablet, Take 1 tablet (40 mg total) by mouth daily., Disp: 90 tablet, Rfl: 3 .  budesonide-formoterol (SYMBICORT) 80-4.5 MCG/ACT inhaler, Inhale 2 puffs into the lungs 2 (two) times daily., Disp: 1 Inhaler, Rfl: 3 .  Calcium Carbonate-Vitamin D (CALCIUM 600+D) 600-400 MG-UNIT per tablet, Take 1 tablet by mouth daily.  , Disp: , Rfl:  .  citalopram (CELEXA) 20 MG tablet, TAKE 1  TABLET EVERY DAY, Disp: 90 tablet, Rfl: 0 .  fenofibrate 160 MG tablet, Take 1 tablet (160 mg total) by mouth daily., Disp: 90 tablet, Rfl: 3 .  hydrochlorothiazide (MICROZIDE) 12.5 MG capsule, Take 1 capsule (12.5 mg total) by mouth daily., Disp: 90 capsule, Rfl: 3 .  lisinopril (PRINIVIL,ZESTRIL) 40 MG tablet, TAKE 1 TABLET (40 MG TOTAL) BY MOUTH DAILY., Disp: 90 tablet, Rfl: 1 .  metFORMIN (GLUCOPHAGE) 500 MG tablet, Take 1-2 tablets (500-1,000 mg total) by mouth 2 (two) times daily with a meal. Takes 1000 mg in the morning and 500 mg in the evening., Disp: 180 tablet, Rfl: 3 .  Omega-3 Fatty Acids (FISH OIL PO), Take 1 capsule by mouth daily., Disp: , Rfl:  .  polyethylene glycol (MIRALAX / GLYCOLAX) packet, Take 17 g by mouth daily., Disp: , Rfl:  .  predniSONE (DELTASONE) 5 MG tablet, Label  & dispense according to the schedule below. 10 Pills PO for 3 days then, 8 Pills PO for 3 days, 6 Pills PO for 3 days, 4 Pills PO for 3 days, 2 Pills PO for 3 days, 1 Pills PO for 3 days, 1/2 Pill  PO for 3 days then STOP. Total 95 pills., Disp: 95 tablet, Rfl: 0 .  tiotropium (SPIRIVA HANDIHALER) 18 MCG inhalation capsule, Place 1 capsule (18 mcg total) into inhaler and inhale daily., Disp: 30 capsule, Rfl: 0    Review of Systems  Constitutional: Negative for fever and unexpected weight change.  HENT: Negative for congestion, dental problem, ear pain, nosebleeds, postnasal drip, rhinorrhea, sinus pressure, sneezing, sore throat and trouble swallowing.   Eyes: Negative for redness and itching.  Respiratory: Positive for cough and wheezing. Negative for chest tightness and shortness of breath.   Cardiovascular: Negative for palpitations and leg swelling.  Gastrointestinal: Negative for nausea and vomiting.  Genitourinary: Negative for dysuria.  Musculoskeletal: Negative for joint swelling.  Skin: Negative for rash.  Neurological: Negative for headaches.  Hematological: Does not bruise/bleed easily.    Psychiatric/Behavioral: Negative for dysphoric mood. The patient is not nervous/anxious.        Objective:   Physical Exam  Constitutional: She is oriented to person, place, and time. She appears well-developed and well-nourished. No distress.  HENT:  Head: Normocephalic and atraumatic.  Right Ear: External ear normal.  Left Ear: External ear normal.  Mouth/Throat: Oropharynx is clear and moist. No oropharyngeal exudate.  Eyes: Conjunctivae and EOM are normal. Pupils are equal, round, and reactive to light. Right eye exhibits no discharge. Left eye exhibits no discharge. No scleral icterus.  Neck: Normal range of motion. Neck supple. No JVD present. No tracheal deviation present. No thyromegaly present.  Cardiovascular: Normal rate, regular rhythm, normal heart sounds and intact distal pulses.  Exam reveals no gallop and no friction rub.   No murmur heard. Pulmonary/Chest: Effort normal. No respiratory distress. She has wheezes. She has no rales. She exhibits no tenderness.  Barrel-shaped chest  Abdominal: Soft. Bowel sounds are normal. She exhibits no distension and no mass. There is no tenderness. There is no rebound and no guarding.  Musculoskeletal: Normal range of motion. She exhibits no edema or tenderness.  Lymphadenopathy:    She has no cervical adenopathy.  Neurological: She is alert and oriented to person, place, and time. She has normal reflexes. No cranial nerve deficit. She exhibits normal muscle tone. Coordination normal.  Skin: Skin is warm and dry. No rash noted. She is not diaphoretic. No erythema. No pallor.  Psychiatric: She has a normal mood and affect. Her behavior is normal. Judgment and thought content normal.  Vitals reviewed.   Filed Vitals:   09/25/15 1405  BP: 146/60  Pulse: 91  Height: '5\' 4"'$  (1.626 m)  Weight: 133 lb (60.328 kg)  SpO2: 93%         Assessment & Plan:     ICD-9-CM ICD-10-CM   1. Chronic obstructive pulmonary disease, unspecified  COPD, unspecified chronic bronchitis type 496 J44.9   2. COPD exacerbation 491.21 J44.1     I think COPD with an exacerbation. Baseline severity is unknown You are actively wheezing right now Take prednisone 40 mg daily x 2 days, then '20mg'$  daily x 2 days, then '10mg'$  daily x 2 days, then '5mg'$  daily x 2 days and stop Lisinopril can be contributing to your wheezing - please stop this  - Instead start losartan 25 mg once daily Stop Spiriva and Symbicort due to cost Instead start DuoNeb 4 times daily and as needed + Flovent 110 g 2 puff 2 times daily Use albuterol as needed For now continue oxygen Alpha 1 genetic test today  Follow-up Do full pulmonary function test in 3 weeks Return to see my nurse practitioner in 3 weeks Reassess oxygen need at follow-up Qit smoking discussion at next visit     Dr. Brand Males, M.D., [facility].C.P Pulmonary and Critical Care Medicine Staff Physician [facility] Pager: [phone], If no answer or between  15:00h - 7:00h: call [phone]  09/25/2015 2:33 PM

## 2015-09-25 NOTE — Patient Instructions (Addendum)
ICD-9-CM ICD-10-CM   1. Chronic obstructive pulmonary disease, unspecified COPD, unspecified chronic bronchitis type 496 J44.9   2. COPD exacerbation 491.21 J44.1    I think COPD You are actively wheezing right now Take prednisone 40 mg daily x 2 days, then '20mg'$  daily x 2 days, then '10mg'$  daily x 2 days, then '5mg'$  daily x 2 days and stop Lisinopril can be contributing to your wheezing - please stop this  - Instead start losartan 25 mg once daily Stop Spiriva and Symbicort due to cost Instead start DuoNeb 4 times daily and as needed + Flovent 110 g 2 puff 2 times daily Use albuterol as needed For now continue oxygen Alpha 1 genetic test today  Follow-up Do full pulmonary function test in 3 weeks Return to see my nurse practitioner in 3 weeks Reassess oxygen need at follow-up Qit smoking discussion at next visit

## 2015-09-25 NOTE — Addendum Note (Signed)
Addended by: Maurice March on: 09/25/2015 03:09 PM   Modules accepted: Orders

## 2015-10-03 LAB — ALPHA-1 ANTITRYPSIN PHENOTYPE: A-1 Antitrypsin: 169 mg/dL (ref 83–199)

## 2015-10-05 ENCOUNTER — Other Ambulatory Visit: Payer: Self-pay | Admitting: Nurse Practitioner

## 2015-10-05 DIAGNOSIS — J441 Chronic obstructive pulmonary disease with (acute) exacerbation: Secondary | ICD-10-CM | POA: Diagnosis not present

## 2015-10-06 NOTE — Telephone Encounter (Signed)
Last filled 09/06/15, last seen 09/03/15. Call in at [facility]

## 2015-10-06 NOTE — Telephone Encounter (Signed)
Please call in xanax with 1 refills 

## 2015-10-16 ENCOUNTER — Ambulatory Visit (HOSPITAL_COMMUNITY)
Admission: RE | Admit: 2015-10-16 | Discharge: 2015-10-16 | Disposition: A | Discharge status: Home / Self Care | Payer: Medicare Other | Source: Ambulatory Visit | Attending: Internal Medicine | Admitting: Internal Medicine

## 2015-10-16 ENCOUNTER — Encounter: Payer: Self-pay | Admitting: Adult Health

## 2015-10-16 ENCOUNTER — Ambulatory Visit (INDEPENDENT_AMBULATORY_CARE_PROVIDER_SITE_OTHER): Payer: Medicare Other | Admitting: Adult Health

## 2015-10-16 VITALS — BP 112/64 | HR 90 | Temp 98.3°F | Ht 64.0 in | Wt 135.0 lb

## 2015-10-16 DIAGNOSIS — R0609 Other forms of dyspnea: Secondary | ICD-10-CM | POA: Diagnosis not present

## 2015-10-16 DIAGNOSIS — R062 Wheezing: Secondary | ICD-10-CM | POA: Diagnosis not present

## 2015-10-16 DIAGNOSIS — F17210 Nicotine dependence, cigarettes, uncomplicated: Secondary | ICD-10-CM | POA: Insufficient documentation

## 2015-10-16 DIAGNOSIS — J441 Chronic obstructive pulmonary disease with (acute) exacerbation: Secondary | ICD-10-CM | POA: Insufficient documentation

## 2015-10-16 DIAGNOSIS — J449 Chronic obstructive pulmonary disease, unspecified: Secondary | ICD-10-CM | POA: Diagnosis not present

## 2015-10-16 DIAGNOSIS — J9611 Chronic respiratory failure with hypoxia: Secondary | ICD-10-CM

## 2015-10-16 DIAGNOSIS — J9610 Chronic respiratory failure, unspecified whether with hypoxia or hypercapnia: Secondary | ICD-10-CM | POA: Insufficient documentation

## 2015-10-16 DIAGNOSIS — R05 Cough: Secondary | ICD-10-CM | POA: Insufficient documentation

## 2015-10-16 LAB — PULMONARY FUNCTION TEST
FEF 25-75 PRE: 0.17 L/s
FEF 25-75 Post: 0.22 L/sec
FEF2575-%CHANGE-POST: 25 %
FEF2575-%PRED-POST: 14 %
FEF2575-%PRED-PRE: 11 %
FEV1-%Change-Post: 9 %
FEV1-%PRED-POST: 30 %
FEV1-%Pred-Pre: 28 %
FEV1-POST: 0.61 L
FEV1-Pre: 0.55 L
FEV1FVC-%Change-Post: 7 %
FEV1FVC-%Pred-Pre: 69 %
FEV6-%CHANGE-POST: 3 %
FEV6-%PRED-POST: 40 %
FEV6-%PRED-PRE: 39 %
FEV6-POST: 1 L
FEV6-Pre: 0.97 L
FEV6FVC-%CHANGE-POST: 1 %
FEV6FVC-%PRED-POST: 95 %
FEV6FVC-%Pred-Pre: 94 %
FVC-%CHANGE-POST: 1 %
FVC-%Pred-Post: 41 %
FVC-%Pred-Pre: 41 %
FVC-POST: 1.1 L
FVC-Pre: 1.08 L
PRE FEV1/FVC RATIO: 51 %
Post FEV1/FVC ratio: 55 %
Post FEV6/FVC ratio: 91 %
Pre FEV6/FVC Ratio: 90 %
RV % PRED: 189 %
RV: 4.42 L
TLC % pred: 113 %
TLC: 5.65 L

## 2015-10-16 MED ORDER — ALBUTEROL SULFATE HFA 108 (90 BASE) MCG/ACT IN AERS: 2.0000 | INHALATION_SPRAY | Freq: Four times a day (QID) | RESPIRATORY_TRACT | Status: DC | PRN

## 2015-10-16 MED ORDER — FLUTICASONE PROPIONATE HFA 110 MCG/ACT IN AERO: 2.0000 | INHALATION_SPRAY | Freq: Two times a day (BID) | RESPIRATORY_TRACT | Status: DC

## 2015-10-16 MED ORDER — ALBUTEROL SULFATE (2.5 MG/3ML) 0.083% IN NEBU
2.5000 mg | INHALATION_SOLUTION | Freq: Once | RESPIRATORY_TRACT | Status: AC
  Administered 2015-10-16: 2.5 mg via RESPIRATORY_TRACT

## 2015-10-16 MED ORDER — ALBUTEROL SULFATE (2.5 MG/3ML) 0.083% IN NEBU: 2.5000 mg | INHALATION_SOLUTION | Freq: Four times a day (QID) | RESPIRATORY_TRACT | Status: DC | PRN

## 2015-10-16 NOTE — Assessment & Plan Note (Addendum)
GOLD IV COPD /O2 dependent  Encouraged on smking cessation   Plan  Continue on Duoneb Four times a day .  Begin Flovent 2 puffs Twice daily   Continue on Oxygen 2l/m  Must quit smoking .  Follow up Dr. Chase Caller in 2 months and As needed

## 2015-10-16 NOTE — Progress Notes (Signed)
   Subjective:    Patient ID: Jacqueline Cervantes, female    DOB: February 13, 1935, 80 y.o.   MRN: [id number]  HPI 80 yo female smoker seen for pulmonary consult by Dr. Chase Caller in Sept 2016 for COPD   10/16/2015 Follow up : COPD   Pt underwent PFT today that FEV1 30%, ratio 55, no sign BD response, FVC 41%. Unable to complete DLCO maneuvers.  She was taken off Lisinopril due ongoing cough.  She was given prednisone taper for active flare.  Changed from Symbicort to Duoneb and Flovent (d/t cost)  Did not have Flovent at pharmacy .  Remains on O2 at 2l/m  Alpha 1 was okay at 169 , MM phenotype.  She feels the nebs are helping her better.  Gets short of breath easily , has some cough on/off.  Flu shot utd      Review of Systems Constitutional:   No  weight loss, night sweats,  Fevers, chills,  +fatigue, or  lassitude.  HEENT:   No headaches,  Difficulty swallowing,  Tooth/dental problems, or  Sore throat,                No sneezing, itching, ear ache, nasal congestion, post nasal drip,   CV:  No chest pain,  Orthopnea, PND, swelling in lower extremities, anasarca, dizziness, palpitations, syncope.   GI  No heartburn, indigestion, abdominal pain, nausea, vomiting, diarrhea, change in bowel habits, loss of appetite, bloody stools.   Resp:    No chest wall deformity  Skin: no rash or lesions.  GU: no dysuria, change in color of urine, no urgency or frequency.  No flank pain, no hematuria   MS:  No joint pain or swelling.  No decreased range of motion.  No back pain.  Psych:  No change in mood or affect. No depression or anxiety.  No memory loss.          Objective:   Physical Exam .GEN: A/Ox3; pleasant , NAD, chronically ill appearing , elderly   HEENT:  [place]/AT,  EACs-clear, TMs-wnl, NOSE-clear, THROAT-clear, no lesions, no postnasal drip or exudate noted.   NECK:  Supple w/ fair ROM; no JVD; normal carotid impulses w/o bruits; no thyromegaly or nodules palpated; no  lymphadenopathy.  RESP  Few trace wheezing no accessory muscle use, no dullness to percussion  CARD:  RRR, no m/r/g  , no peripheral edema, pulses intact, no cyanosis or clubbing.  GI:   Soft & nt; nml bowel sounds; no organomegaly or masses detected.  Musco: Warm bil, no deformities or joint swelling noted.   Neuro: alert, no focal deficits noted.    Skin: Warm, no lesions or rashes         Assessment & Plan:

## 2015-10-16 NOTE — Assessment & Plan Note (Addendum)
Cont on O2 .  

## 2015-10-16 NOTE — Patient Instructions (Signed)
Continue on Duoneb Four times a day .  Begin Flovent 2 puffs Twice daily   Continue on Oxygen 2l/m  Must quit smoking .  Follow up Dr. Chase Caller in 2 months and As needed

## 2015-10-19 ENCOUNTER — Other Ambulatory Visit: Payer: Self-pay | Admitting: Internal Medicine

## 2015-10-19 NOTE — Progress Notes (Signed)
Quick Note:  Called and spoke to pt. Informed her of the results and recs per MR. Pt verbalized understanding and denied any further questions or concerns at this time.   ______ 

## 2015-10-23 ENCOUNTER — Other Ambulatory Visit: Payer: Self-pay | Admitting: Nurse Practitioner

## 2015-10-23 DIAGNOSIS — J441 Chronic obstructive pulmonary disease with (acute) exacerbation: Secondary | ICD-10-CM | POA: Diagnosis not present

## 2015-10-29 DIAGNOSIS — J449 Chronic obstructive pulmonary disease, unspecified: Secondary | ICD-10-CM | POA: Diagnosis not present

## 2015-11-05 DIAGNOSIS — J441 Chronic obstructive pulmonary disease with (acute) exacerbation: Secondary | ICD-10-CM | POA: Diagnosis not present

## 2015-11-26 DIAGNOSIS — J449 Chronic obstructive pulmonary disease, unspecified: Secondary | ICD-10-CM | POA: Diagnosis not present

## 2015-12-05 DIAGNOSIS — J441 Chronic obstructive pulmonary disease with (acute) exacerbation: Secondary | ICD-10-CM | POA: Diagnosis not present

## 2015-12-10 ENCOUNTER — Other Ambulatory Visit: Payer: Self-pay | Admitting: Nurse Practitioner

## 2015-12-10 NOTE — Telephone Encounter (Signed)
Last seen 09/21/15  Dr Livia Snellen  If approved route to nurse to call into [facility]

## 2015-12-11 NOTE — Telephone Encounter (Signed)
Please call in alprazolam with 1 refills 

## 2015-12-11 NOTE — Telephone Encounter (Signed)
Please review and advise.

## 2015-12-14 NOTE — Telephone Encounter (Signed)
rx called into pharmacy

## 2015-12-18 ENCOUNTER — Other Ambulatory Visit: Payer: Self-pay | Admitting: Internal Medicine

## 2015-12-21 ENCOUNTER — Other Ambulatory Visit: Payer: Self-pay | Admitting: Nurse Practitioner

## 2015-12-22 NOTE — Telephone Encounter (Signed)
Dr Chase Caller, please advise if you are going to continue refilling this patients Losartan '25mg'$  - rxd at last OV 10/19/15, pt was changed from Lisinopril to this medication.

## 2015-12-23 NOTE — Telephone Encounter (Signed)
Please review and advise.

## 2015-12-24 NOTE — Telephone Encounter (Signed)
Please call in xanax with 1 refills 

## 2015-12-24 NOTE — Telephone Encounter (Signed)
rx called into pharmacy

## 2015-12-30 ENCOUNTER — Telehealth: Payer: Self-pay | Admitting: *Deleted

## 2015-12-30 NOTE — Telephone Encounter (Signed)
Refill given on losartan.

## 2016-01-05 ENCOUNTER — Ambulatory Visit: Payer: Medicare Other | Admitting: Internal Medicine

## 2016-01-05 DIAGNOSIS — J441 Chronic obstructive pulmonary disease with (acute) exacerbation: Secondary | ICD-10-CM | POA: Diagnosis not present

## 2016-01-26 ENCOUNTER — Ambulatory Visit: Payer: Medicare Other | Admitting: Nurse Practitioner

## 2016-02-01 DIAGNOSIS — J449 Chronic obstructive pulmonary disease, unspecified: Secondary | ICD-10-CM | POA: Diagnosis not present

## 2016-02-05 DIAGNOSIS — J441 Chronic obstructive pulmonary disease with (acute) exacerbation: Secondary | ICD-10-CM | POA: Diagnosis not present

## 2016-02-08 ENCOUNTER — Encounter: Payer: Self-pay | Admitting: Nurse Practitioner

## 2016-02-08 ENCOUNTER — Ambulatory Visit (INDEPENDENT_AMBULATORY_CARE_PROVIDER_SITE_OTHER): Payer: Medicare Other | Admitting: Nurse Practitioner

## 2016-02-08 VITALS — BP 120/68 | HR 98 | Temp 97.0°F | Ht 64.0 in | Wt 122.0 lb

## 2016-02-08 DIAGNOSIS — E119 Type 2 diabetes mellitus without complications: Secondary | ICD-10-CM

## 2016-02-08 DIAGNOSIS — J449 Chronic obstructive pulmonary disease, unspecified: Secondary | ICD-10-CM | POA: Diagnosis not present

## 2016-02-08 DIAGNOSIS — E785 Hyperlipidemia, unspecified: Secondary | ICD-10-CM

## 2016-02-08 DIAGNOSIS — I10 Essential (primary) hypertension: Secondary | ICD-10-CM | POA: Diagnosis not present

## 2016-02-08 DIAGNOSIS — Z720 Tobacco use: Secondary | ICD-10-CM | POA: Diagnosis not present

## 2016-02-08 DIAGNOSIS — F411 Generalized anxiety disorder: Secondary | ICD-10-CM

## 2016-02-08 LAB — POCT GLYCOSYLATED HEMOGLOBIN (HGB A1C): Hemoglobin A1C: 5.5

## 2016-02-08 MED ORDER — ALPRAZOLAM 0.5 MG PO TABS: 0.5000 mg | ORAL_TABLET | Freq: Two times a day (BID) | ORAL | Status: DC

## 2016-02-08 MED ORDER — CITALOPRAM HYDROBROMIDE 40 MG PO TABS: 40.0000 mg | ORAL_TABLET | Freq: Every day | ORAL | Status: DC

## 2016-02-08 NOTE — Patient Instructions (Signed)
Smoking Cessation, Tips for Success If you are ready to quit smoking, congratulations! You have chosen to help yourself be healthier. Cigarettes bring nicotine, tar, carbon monoxide, and other irritants into your body. Your lungs, heart, and blood vessels will be able to work better without these poisons. There are many different ways to quit smoking. Nicotine gum, nicotine patches, a nicotine inhaler, or nicotine nasal spray can help with physical craving. Hypnosis, support groups, and medicines help break the habit of smoking. WHAT THINGS CAN I DO TO MAKE QUITTING EASIER?  Here are some tips to help you quit for good:  Pick a date when you will quit smoking completely. Tell all of your friends and family about your plan to quit on that date.  Do not try to slowly cut down on the number of cigarettes you are smoking. Pick a quit date and quit smoking completely starting on that day.  Throw away all cigarettes.   Clean and remove all ashtrays from your home, work, and car.  On a card, write down your reasons for quitting. Carry the card with you and read it when you get the urge to smoke.  Cleanse your body of nicotine. Drink enough water and fluids to keep your urine clear or pale yellow. Do this after quitting to flush the nicotine from your body.  Learn to predict your moods. Do not let a bad situation be your excuse to have a cigarette. Some situations in your life might tempt you into wanting a cigarette.  Never have "just one" cigarette. It leads to wanting another and another. Remind yourself of your decision to quit.  Change habits associated with smoking. If you smoked while driving or when feeling stressed, try other activities to replace smoking. Stand up when drinking your coffee. Brush your teeth after eating. Sit in a different chair when you read the paper. Avoid alcohol while trying to quit, and try to drink fewer caffeinated beverages. Alcohol and caffeine may urge you to  smoke.  Avoid foods and drinks that can trigger a desire to smoke, such as sugary or spicy foods and alcohol.  Ask people who smoke not to smoke around you.  Have something planned to do right after eating or having a cup of coffee. For example, plan to take a walk or exercise.  Try a relaxation exercise to calm you down and decrease your stress. Remember, you may be tense and nervous for the first 2 weeks after you quit, but this will pass.  Find new activities to keep your hands busy. Play with a pen, coin, or rubber band. Doodle or draw things on paper.  Brush your teeth right after eating. This will help cut down on the craving for the taste of tobacco after meals. You can also try mouthwash.   Use oral substitutes in place of cigarettes. Try using lemon drops, carrots, cinnamon sticks, or chewing gum. Keep them handy so they are available when you have the urge to smoke.  When you have the urge to smoke, try deep breathing.  Designate your home as a nonsmoking area.  If you are a heavy smoker, ask your health care provider about a prescription for nicotine chewing gum. It can ease your withdrawal from nicotine.  Reward yourself. Set aside the cigarette money you save and buy yourself something nice.  Look for support from others. Join a support group or smoking cessation program. Ask someone at home or at work to help you with your plan   to quit smoking.  Always ask yourself, "Do I need this cigarette or is this just a reflex?" Tell yourself, "Today, I choose not to smoke," or "I do not want to smoke." You are reminding yourself of your decision to quit.  Do not replace cigarette smoking with electronic cigarettes (commonly called e-cigarettes). The safety of e-cigarettes is unknown, and some may contain harmful chemicals.  If you relapse, do not give up! Plan ahead and think about what you will do the next time you get the urge to smoke. HOW WILL I FEEL WHEN I QUIT SMOKING? You  may have symptoms of withdrawal because your body is used to nicotine (the addictive substance in cigarettes). You may crave cigarettes, be irritable, feel very hungry, cough often, get headaches, or have difficulty concentrating. The withdrawal symptoms are only temporary. They are strongest when you first quit but will go away within 10-14 days. When withdrawal symptoms occur, stay in control. Think about your reasons for quitting. Remind yourself that these are signs that your body is healing and getting used to being without cigarettes. Remember that withdrawal symptoms are easier to treat than the major diseases that smoking can cause.  Even after the withdrawal is over, expect periodic urges to smoke. However, these cravings are generally short lived and will go away whether you smoke or not. Do not smoke! WHAT RESOURCES ARE AVAILABLE TO HELP ME QUIT SMOKING? Your health care provider can direct you to community resources or hospitals for support, which may include:  Group support.  Education.  Hypnosis.  Therapy.   This information is not intended to replace advice given to you by your health care provider. Make sure you discuss any questions you have with your health care provider.   Document Released: 09/09/2004 Document Revised: 01/02/2015 Document Reviewed: 05/30/2013 Elsevier Interactive Patient Education 2016 [employer] Inc[employer]  

## 2016-02-08 NOTE — Progress Notes (Signed)
Subjective:    Patient ID: Jacqueline Cervantes, female    DOB: 1935-03-18, 81 y.o.   MRN: [id number]  Patient here today for follow up of chronic medical problems.  Outpatient Encounter Prescriptions as of 02/08/2016  Medication Sig  . albuterol (PROVENTIL HFA;VENTOLIN HFA) 108 (90 BASE) MCG/ACT inhaler Inhale 2 puffs into the lungs every 6 (six) hours as needed for wheezing or shortness of breath.  Marland Kitchen albuterol (PROVENTIL) (2.5 MG/3ML) 0.083% nebulizer solution Take 3 mLs (2.5 mg total) by nebulization every 6 (six) hours as needed for wheezing or shortness of breath.  Marland Kitchen alendronate (FOSAMAX) 70 MG tablet TAKE 1 TABLET BY MOUTH EVERY WEEK WITH A FULL GLASS OF WATER ON AN EMPTY STOMACH ON FRIDAYS  . ALPRAZolam (XANAX) 0.5 MG tablet TAKE 1 TABLET TWICE A DAY  . amLODipine (NORVASC) 10 MG tablet Take 1 tablet (10 mg total) by mouth daily.  Marland Kitchen atorvastatin (LIPITOR) 40 MG tablet Take 1 tablet (40 mg total) by mouth daily.  . budesonide-formoterol (SYMBICORT) 80-4.5 MCG/ACT inhaler Inhale 2 puffs into the lungs 2 (two) times daily.  . Calcium Carbonate-Vitamin D (CALCIUM 600+D) 600-400 MG-UNIT per tablet Take 1 tablet by mouth daily.    . citalopram (CELEXA) 20 MG tablet TAKE 1 TABLET EVERY DAY  . fenofibrate 160 MG tablet Take 1 tablet (160 mg total) by mouth daily.  . fluticasone (FLOVENT HFA) 110 MCG/ACT inhaler Inhale 2 puffs into the lungs 2 (two) times daily.  . hydrochlorothiazide (MICROZIDE) 12.5 MG capsule Take 1 capsule (12.5 mg total) by mouth daily.  Marland Kitchen ipratropium-albuterol (DUONEB) 0.5-2.5 (3) MG/3ML SOLN Take 3 mLs by nebulization 4 (four) times daily as needed.  Marland Kitchen lisinopril (PRINIVIL,ZESTRIL) 40 MG tablet TAKE 1 TABLET (40 MG TOTAL) BY MOUTH DAILY.  Marland Kitchen losartan (COZAAR) 25 MG tablet TAKE 1 TABLET (25 MG TOTAL) BY MOUTH DAILY.  . metFORMIN (GLUCOPHAGE) 500 MG tablet TAKE 2 TABLETS IN THE MORNING AND 1 TABLET IN THE EVENING WITH MEALS.  Marland Kitchen Omega-3 Fatty Acids (FISH OIL PO) Take 1 capsule  by mouth daily.  . polyethylene glycol (MIRALAX / GLYCOLAX) packet Take 17 g by mouth daily.   No facility-administered encounter medications on file as of 02/08/2016.      Hyperlipidemia This is a chronic problem. The current episode started more than 1 year ago. The problem is controlled. Recent lipid tests were reviewed and are normal. Exacerbating diseases include diabetes. She has no history of hypothyroidism or obesity. Pertinent negatives include no myalgias. Current antihyperlipidemic treatment includes statins and fibric acid derivatives. The current treatment provides moderate improvement of lipids. Compliance problems include adherence to diet and adherence to exercise.  Risk factors for coronary artery disease include diabetes mellitus, dyslipidemia, hypertension, post-menopausal and a sedentary lifestyle.  Hypertension This is a chronic problem. The current episode started more than 1 year ago. The problem is unchanged. The problem is controlled. Pertinent negatives include no headaches. Risk factors for coronary artery disease include smoking/tobacco exposure, diabetes mellitus, dyslipidemia, post-menopausal state and sedentary lifestyle. Past treatments include calcium channel blockers and ACE inhibitors. The current treatment provides moderate improvement. Compliance problems include diet and exercise.   Diabetes She presents for her follow-up diabetic visit. She has type 2 diabetes mellitus. No MedicAlert identification noted. Her disease course has been fluctuating. Pertinent negatives for hypoglycemia include no headaches. Pertinent negatives for diabetes include no visual change. There are no hypoglycemic complications. Symptoms are stable. There are no diabetic complications. Risk factors for coronary artery  disease include diabetes mellitus, dyslipidemia, family history, hypertension, post-menopausal, sedentary lifestyle and tobacco exposure. Current diabetic treatment includes oral  agent (monotherapy). She is compliant with treatment most of the time. Her weight is stable. She is following a diabetic diet. When asked about meal planning, she reported none. She has not had a previous visit with a dietitian. She rarely participates in exercise. There is no change in her home blood glucose trend. Her breakfast blood glucose is taken between 8-9 am. Her breakfast blood glucose range is generally 110-130 mg/dl. Her overall blood glucose range is 110-130 mg/dl. An ACE inhibitor/angiotensin II receptor blocker is being taken. She does not see a podiatrist.Eye exam is not current.  Osteoporosis Pt currently taking alendronate 70 mg weekly. Pt states she is due for Dexascan in Aug 2016.  COPD Uses neb machine one time a day along with symbicort- Patient still SMOKES 2-3 x a day GAD Takes xanax usually just at night but occassionally needs during the day.    Review of Systems  Constitutional: Negative.   HENT: Negative.   Eyes: Negative.   Cardiovascular: Negative.   Gastrointestinal: Negative.   Endocrine: Negative.   Genitourinary: Negative.   Musculoskeletal: Negative.  Negative for myalgias.  Neurological: Negative.  Negative for headaches.  Hematological: Negative.   Psychiatric/Behavioral: Negative.   All other systems reviewed and are negative.      Objective:   Physical Exam  Constitutional: She is oriented to person, place, and time. She appears well-developed and well-nourished. No distress.  HENT:  Head: Normocephalic and atraumatic.  Right Ear: External ear normal.  Mouth/Throat: Oropharynx is clear and moist.  Eyes: Pupils are equal, round, and reactive to light.  Neck: Normal range of motion. Neck supple. No thyromegaly present.  Cardiovascular: Normal rate, regular rhythm and intact distal pulses.   Murmur (1/6 systolic murmur) heard. Pulmonary/Chest: Effort normal. No respiratory distress. She has wheezes (onsp and exp wheezes throughout).  Wheezes  in bilateral lungs, nonproductive cough  Abdominal: Soft. Bowel sounds are normal. She exhibits no distension. There is no tenderness.  Musculoskeletal: Normal range of motion. She exhibits no edema or tenderness.  Neurological: She is alert and oriented to person, place, and time. She has normal reflexes. No cranial nerve deficit.  Skin: Skin is warm and dry.  Psychiatric: She has a normal mood and affect. Her behavior is normal. Judgment and thought content normal.  Vitals reviewed.  BP 120/68 mmHg  Pulse 98  Temp(Src) 97 F (36.1 C) (Oral)  Ht _0  (1.626 m)  Wt 122 lb (55.339 kg)  BMI 20.93 kg/m2  SpO2 92%   Results for orders placed or performed in visit on 02/08/16  POCT glycosylated hemoglobin (Hb A1C)  Result Value Ref Range   Hemoglobin A1C 5.5          Assessment & Plan:   1. Essential hypertension Do not add salt to diet - CMP14+EGFR  2. HLD (hyperlipidemia) Low fat diet - Lipid panel  3. Type 2 diabetes mellitus without complication, without long-term current use of insulin (HCC) Continue to carb count - POCT glycosylated hemoglobin (Hb A1C) - Microalbumin / creatinine urine ratio  4. COPD with chronic bronchitis (HCC) Continue O2 Gave symbicort samples Nebulizer as needed  5. Tobacco user Smoking cessation encouraged  6. Generalized anxiety disorder Stress management Increased celexa to 9m - citalopram (CELEXA) 40 MG tablet; Take 1 tablet (40 mg total) by mouth daily.  Dispense: 30 tablet; Refill: 5 - ALPRAZolam (Duanne Moron  0.5 MG tablet; Take 1 tablet (0.5 mg total) by mouth 2 (two) times daily.  Dispense: 60 tablet; Refill: 1    Labs pending Health maintenance reviewed Diet and exercise encouraged Continue all meds Follow up  In 3 month   [place], FNP

## 2016-02-09 LAB — CMP14+EGFR
A/G RATIO: 1.7 (ref 1.1–2.5)
ALT: 11 IU/L (ref 0–32)
AST: 16 IU/L (ref 0–40)
Albumin: 4.5 g/dL (ref 3.5–4.8)
Alkaline Phosphatase: 19 IU/L — ABNORMAL LOW (ref 39–117)
BUN/Creatinine Ratio: 30 — ABNORMAL HIGH (ref 11–26)
BUN: 28 mg/dL — AB (ref 8–27)
Bilirubin Total: 0.2 mg/dL (ref 0.0–1.2)
CALCIUM: 11.1 mg/dL — AB (ref 8.7–10.3)
CO2: 30 mmol/L — ABNORMAL HIGH (ref 18–29)
Chloride: 89 mmol/L — ABNORMAL LOW (ref 96–106)
Creatinine, Ser: 0.92 mg/dL (ref 0.57–1.00)
GFR, EST AFRICAN AMERICAN: 69 mL/min/{1.73_m2} (ref >59)
GFR, EST NON AFRICAN AMERICAN: 60 mL/min/{1.73_m2} (ref >59)
Globulin, Total: 2.6 g/dL (ref 1.5–4.5)
Glucose: 100 mg/dL — ABNORMAL HIGH (ref 65–99)
POTASSIUM: 4.4 mmol/L (ref 3.5–5.2)
Sodium: 138 mmol/L (ref 134–144)
TOTAL PROTEIN: 7.1 g/dL (ref 6.0–8.5)

## 2016-02-09 LAB — LIPID PANEL
CHOL/HDL RATIO: 2.7 ratio (ref 0.0–4.4)
Cholesterol, Total: 126 mg/dL (ref 100–199)
HDL: 46 mg/dL (ref >39)
LDL Calculated: 35 mg/dL (ref 0–99)
Triglycerides: 224 mg/dL — ABNORMAL HIGH (ref 0–149)
VLDL CHOLESTEROL CAL: 45 mg/dL — AB (ref 5–40)

## 2016-02-12 ENCOUNTER — Other Ambulatory Visit: Payer: Medicare Other

## 2016-02-12 DIAGNOSIS — R799 Abnormal finding of blood chemistry, unspecified: Secondary | ICD-10-CM

## 2016-02-12 NOTE — Progress Notes (Signed)
Lab only 

## 2016-02-15 LAB — PTH, INTACT AND CALCIUM
CALCIUM: 11.1 mg/dL — AB (ref 8.7–10.3)
PTH: 18 pg/mL (ref 15–65)

## 2016-03-04 DIAGNOSIS — J441 Chronic obstructive pulmonary disease with (acute) exacerbation: Secondary | ICD-10-CM | POA: Diagnosis not present

## 2016-03-05 ENCOUNTER — Other Ambulatory Visit: Payer: Self-pay | Admitting: Nurse Practitioner

## 2016-03-07 NOTE — Telephone Encounter (Signed)
Says filled 02/08/16 with one refill, pharmacy does not have

## 2016-03-07 NOTE — Telephone Encounter (Signed)
rx called into pharmacy

## 2016-03-07 NOTE — Telephone Encounter (Signed)
Please call in xanax with 1 refills 

## 2016-03-08 ENCOUNTER — Ambulatory Visit: Payer: Medicare Other | Admitting: Internal Medicine

## 2016-03-19 ENCOUNTER — Other Ambulatory Visit: Payer: Self-pay | Admitting: Family Medicine

## 2016-03-22 DIAGNOSIS — J449 Chronic obstructive pulmonary disease, unspecified: Secondary | ICD-10-CM | POA: Diagnosis not present

## 2016-03-25 ENCOUNTER — Other Ambulatory Visit: Payer: Self-pay | Admitting: Nurse Practitioner

## 2016-03-26 ENCOUNTER — Other Ambulatory Visit: Payer: Self-pay | Admitting: Nurse Practitioner

## 2016-04-04 DIAGNOSIS — J441 Chronic obstructive pulmonary disease with (acute) exacerbation: Secondary | ICD-10-CM | POA: Diagnosis not present

## 2016-04-06 ENCOUNTER — Telehealth: Payer: Self-pay | Admitting: Nurse Practitioner

## 2016-04-06 NOTE — Telephone Encounter (Signed)
Pt given appt with Dr.Bradshaw tomorrow at 3:55.

## 2016-04-07 ENCOUNTER — Ambulatory Visit (INDEPENDENT_AMBULATORY_CARE_PROVIDER_SITE_OTHER): Payer: Medicare Other | Admitting: Family Medicine

## 2016-04-07 ENCOUNTER — Encounter: Payer: Self-pay | Admitting: Family Medicine

## 2016-04-07 ENCOUNTER — Ambulatory Visit (INDEPENDENT_AMBULATORY_CARE_PROVIDER_SITE_OTHER): Payer: Medicare Other

## 2016-04-07 VITALS — BP 128/68 | HR 87 | Temp 98.6°F | Ht 64.0 in | Wt 120.8 lb

## 2016-04-07 DIAGNOSIS — R059 Cough, unspecified: Secondary | ICD-10-CM

## 2016-04-07 DIAGNOSIS — R05 Cough: Secondary | ICD-10-CM | POA: Diagnosis not present

## 2016-04-07 MED ORDER — PREDNISONE 20 MG PO TABS: 40.0000 mg | ORAL_TABLET | Freq: Every day | ORAL | Status: DC

## 2016-04-07 MED ORDER — DOXYCYCLINE HYCLATE 100 MG PO TABS: 100.0000 mg | ORAL_TABLET | Freq: Two times a day (BID) | ORAL | Status: DC

## 2016-04-07 NOTE — Patient Instructions (Signed)
Great to meet you!  I have sent her prednisone and doxycycline.   Please get medical help right away if her breathing worsens.    Chronic Obstructive Pulmonary Disease Chronic obstructive pulmonary disease (COPD) is a common lung problem. In COPD, the flow of air from the lungs is limited. The way your lungs work will probably never return to normal, but there are things you can do to improve your lungs and make yourself feel better. Your doctor may treat your condition with:  Medicines.  Oxygen.  Lung surgery.  Changes to your diet.  Rehabilitation. This may involve a team of specialists. HOME CARE  Take all medicines as told by your doctor.  Avoid medicines or cough syrups that dry up your airway (such as antihistamines) and do not allow you to get rid of thick spit. You do not need to avoid them if told differently by your doctor.  If you smoke, stop. Smoking makes the problem worse.  Avoid being around things that make your breathing worse (like smoke, chemicals, and fumes).  Use oxygen therapy and therapy to help improve your lungs (pulmonary rehabilitation) if told by your doctor. If you need home oxygen therapy, ask your doctor if you should buy a tool to measure your oxygen level (oximeter).  Avoid people who have a sickness you can catch (contagious).  Avoid going outside when it is very hot, cold, or humid.  Eat healthy foods. Eat smaller meals more often. Rest before meals.  Stay active, but remember to also rest.  Make sure to get all the shots (vaccines) your doctor recommends. Ask your doctor if you need a pneumonia shot.  Learn and use tips on how to relax.  Learn and use tips on how to control your breathing as told by your doctor. Try:  Breathing in (inhaling) through your nose for 1 second. Then, pucker your lips and breath out (exhale) through your lips for 2 seconds.  Putting one hand on your belly (abdomen). Breathe in slowly through your nose for 1  second. Your hand on your belly should move out. Pucker your lips and breathe out slowly through your lips. Your hand on your belly should move in as you breathe out.  Learn and use controlled coughing to clear thick spit from your lungs. The steps are: 1. Lean your head a little forward. 2. Breathe in deeply. 3. Try to hold your breath for 3 seconds. 4. Keep your mouth slightly open while coughing 2 times. 5. Spit any thick spit out into a tissue. 6. Rest and do the steps again 1 or 2 times as needed. GET HELP IF:  You cough up more thick spit than usual.  There is a change in the color or thickness of the spit.  It is harder to breathe than usual.  Your breathing is faster than usual. GET HELP RIGHT AWAY IF:  You have shortness of breath while resting.  You have shortness of breath that stops you from:  Being able to talk.  Doing normal activities.  You chest hurts for longer than 5 minutes.  Your skin color is more blue than usual.  Your pulse oximeter shows that you have low oxygen for longer than 5 minutes. MAKE SURE YOU:  Understand these instructions.  Will watch your condition.  Will get help right away if you are not doing well or get worse.   This information is not intended to replace advice given to you by your health care provider.  Make sure you discuss any questions you have with your health care provider.   Document Released: 05/30/2008 Document Revised: 01/02/2015 Document Reviewed: 08/08/2013 Elsevier Interactive Patient Education [employer].

## 2016-04-07 NOTE — Progress Notes (Signed)
   HPI  Patient presents today here with shortness of breath and wheezing.  Patient explains that over the last 2 days she's had cough, wheezing, and increased shortness of breath. She has severe COPD and is on 2 L of oxygen by nasal cannula at home. She feels like she is maintaining her breathing light normal at baseline, however with any exercise she has worsening symptoms.  She's tolerating food and fluids normally.  She does not want to go to the hospital and that she has to.   PMH: Smoking status noted ROS: Per HPI  Objective: BP 128/68 mmHg  Pulse 87  Temp(Src) 98.6 F (37 C) (Oral)  Ht '5\' 4"'$  (1.626 m)  Wt 120 lb 12.8 oz (54.795 kg)  BMI 20.73 kg/m2  SpO2 86% Gen: NAD, alert, cooperative with exam HEENT: NCAT CV: RRR, good S1/S2, no murmur Resp : Nonlabored, reasonable air movement, expiratory rhonchorous sounds throughout Ext: No edema, warm Neuro: Alert and oriented, No gross deficits  Assessment and plan:  # COPD exacerbation Prednisone and doxy Chest x-ray looks clear, given severe disease will treat without xr findings RTC with any concerns, Discussed ED with any worsening.     Orders Placed This Encounter  Procedures  . DG Chest 2 View    Standing Status: Future     Number of Occurrences: 1     Standing Expiration Date: 06/07/2017    Order Specific Question:  Reason for Exam (SYMPTOM  OR DIAGNOSIS REQUIRED)    Answer:  cough    Order Specific Question:  Preferred imaging location?    Answer:  Internal    Meds ordered this encounter  Medications  . predniSONE (DELTASONE) 20 MG tablet    Sig: Take 2 tablets (40 mg total) by mouth daily with breakfast.    Dispense:  14 tablet    Refill:  0  . doxycycline (VIBRA-TABS) 100 MG tablet    Sig: Take 1 tablet (100 mg total) by mouth 2 (two) times daily. 1 po bid    Dispense:  20 tablet    Refill:  0    Laroy Apple, MD [facility] 04/07/2016, 4:24 PM

## 2016-04-28 ENCOUNTER — Encounter (HOSPITAL_COMMUNITY): Payer: Self-pay | Admitting: Emergency Medicine

## 2016-04-28 ENCOUNTER — Emergency Department (HOSPITAL_COMMUNITY): Payer: Medicare Other

## 2016-04-28 ENCOUNTER — Emergency Department (HOSPITAL_COMMUNITY)
Admission: EM | Admit: 2016-04-28 | Discharge: 2016-04-28 | Disposition: A | Discharge status: Home / Self Care | Payer: Medicare Other | Attending: Emergency Medicine | Admitting: Emergency Medicine

## 2016-04-28 DIAGNOSIS — J441 Chronic obstructive pulmonary disease with (acute) exacerbation: Secondary | ICD-10-CM | POA: Insufficient documentation

## 2016-04-28 DIAGNOSIS — Z7984 Long term (current) use of oral hypoglycemic drugs: Secondary | ICD-10-CM | POA: Insufficient documentation

## 2016-04-28 DIAGNOSIS — Z85828 Personal history of other malignant neoplasm of skin: Secondary | ICD-10-CM | POA: Diagnosis not present

## 2016-04-28 DIAGNOSIS — R1030 Lower abdominal pain, unspecified: Secondary | ICD-10-CM | POA: Diagnosis present

## 2016-04-28 DIAGNOSIS — K5730 Diverticulosis of large intestine without perforation or abscess without bleeding: Secondary | ICD-10-CM | POA: Diagnosis not present

## 2016-04-28 DIAGNOSIS — M1990 Unspecified osteoarthritis, unspecified site: Secondary | ICD-10-CM | POA: Diagnosis not present

## 2016-04-28 DIAGNOSIS — F17210 Nicotine dependence, cigarettes, uncomplicated: Secondary | ICD-10-CM | POA: Diagnosis not present

## 2016-04-28 DIAGNOSIS — E119 Type 2 diabetes mellitus without complications: Secondary | ICD-10-CM | POA: Insufficient documentation

## 2016-04-28 DIAGNOSIS — N390 Urinary tract infection, site not specified: Secondary | ICD-10-CM | POA: Diagnosis not present

## 2016-04-28 DIAGNOSIS — Z79899 Other long term (current) drug therapy: Secondary | ICD-10-CM | POA: Diagnosis not present

## 2016-04-28 DIAGNOSIS — F329 Major depressive disorder, single episode, unspecified: Secondary | ICD-10-CM | POA: Diagnosis not present

## 2016-04-28 DIAGNOSIS — Z853 Personal history of malignant neoplasm of breast: Secondary | ICD-10-CM | POA: Insufficient documentation

## 2016-04-28 DIAGNOSIS — I10 Essential (primary) hypertension: Secondary | ICD-10-CM | POA: Diagnosis not present

## 2016-04-28 LAB — URINALYSIS, ROUTINE W REFLEX MICROSCOPIC
Bilirubin Urine: NEGATIVE
Glucose, UA: NEGATIVE mg/dL
HGB URINE DIPSTICK: NEGATIVE
Ketones, ur: NEGATIVE mg/dL
Nitrite: POSITIVE — AB
PH: 7.5 (ref 5.0–8.0)

## 2016-04-28 LAB — URINE MICROSCOPIC-ADD ON: RBC / HPF: NONE SEEN RBC/hpf (ref 0–5)

## 2016-04-28 LAB — CBC WITH DIFFERENTIAL/PLATELET
Basophils Absolute: 0.1 10*3/uL (ref 0.0–0.1)
Basophils Relative: 1 %
EOS ABS: 0.4 10*3/uL (ref 0.0–0.7)
EOS PCT: 4 %
HCT: 36.4 % (ref 36.0–46.0)
HEMOGLOBIN: 11.6 g/dL — AB (ref 12.0–15.0)
LYMPHS ABS: 2.8 10*3/uL (ref 0.7–4.0)
LYMPHS PCT: 31 %
MCH: 27.1 pg (ref 26.0–34.0)
MCHC: 31.9 g/dL (ref 30.0–36.0)
MCV: 85 fL (ref 78.0–100.0)
MONOS PCT: 7 %
Monocytes Absolute: 0.7 10*3/uL (ref 0.1–1.0)
Neutro Abs: 5.2 10*3/uL (ref 1.7–7.7)
Neutrophils Relative %: 57 %
PLATELETS: 343 10*3/uL (ref 150–400)
RBC: 4.28 MIL/uL (ref 3.87–5.11)
RDW: 16.3 % — ABNORMAL HIGH (ref 11.5–15.5)
WBC: 9 10*3/uL (ref 4.0–10.5)

## 2016-04-28 LAB — COMPREHENSIVE METABOLIC PANEL
ALK PHOS: 20 U/L — AB (ref 38–126)
ALT: 12 U/L — AB (ref 14–54)
AST: 16 U/L (ref 15–41)
Albumin: 4.2 g/dL (ref 3.5–5.0)
Anion gap: 9 (ref 5–15)
BUN: 23 mg/dL — ABNORMAL HIGH (ref 6–20)
CALCIUM: 9.8 mg/dL (ref 8.9–10.3)
CO2: 34 mmol/L — ABNORMAL HIGH (ref 22–32)
CREATININE: 0.79 mg/dL (ref 0.44–1.00)
Chloride: 93 mmol/L — ABNORMAL LOW (ref 101–111)
Glucose, Bld: 98 mg/dL (ref 65–99)
Potassium: 4.2 mmol/L (ref 3.5–5.1)
Sodium: 136 mmol/L (ref 135–145)
Total Bilirubin: 0.5 mg/dL (ref 0.3–1.2)
Total Protein: 7.8 g/dL (ref 6.5–8.1)

## 2016-04-28 LAB — LIPASE, BLOOD: LIPASE: 23 U/L (ref 11–51)

## 2016-04-28 LAB — LACTIC ACID, PLASMA
LACTIC ACID, VENOUS: 1 mmol/L (ref 0.5–2.0)
Lactic Acid, Venous: 1.6 mmol/L (ref 0.5–2.0)

## 2016-04-28 MED ORDER — SODIUM CHLORIDE 0.9 % IV BOLUS (SEPSIS)
1000.0000 mL | Freq: Once | INTRAVENOUS | Status: AC
  Administered 2016-04-28: 1000 mL via INTRAVENOUS

## 2016-04-28 MED ORDER — CEPHALEXIN 500 MG PO CAPS: 500.0000 mg | ORAL_CAPSULE | Freq: Four times a day (QID) | ORAL | Status: DC

## 2016-04-28 MED ORDER — DEXTROSE 5 % IV SOLN
1.0000 g | Freq: Once | INTRAVENOUS | Status: AC
  Administered 2016-04-28: 1 g via INTRAVENOUS
  Filled 2016-04-28: qty 10

## 2016-04-28 MED ORDER — IPRATROPIUM-ALBUTEROL 0.5-2.5 (3) MG/3ML IN SOLN
3.0000 mL | RESPIRATORY_TRACT | Status: DC
  Administered 2016-04-28 (×2): 3 mL via RESPIRATORY_TRACT
  Filled 2016-04-28 (×2): qty 3

## 2016-04-28 MED ORDER — DIATRIZOATE MEGLUMINE & SODIUM 66-10 % PO SOLN
ORAL | Status: AC
  Administered 2016-04-28: 30 mL
  Filled 2016-04-28: qty 30

## 2016-04-28 MED ORDER — FENTANYL CITRATE (PF) 100 MCG/2ML IJ SOLN
50.0000 ug | Freq: Once | INTRAMUSCULAR | Status: AC
  Administered 2016-04-28: 50 ug via INTRAVENOUS
  Filled 2016-04-28: qty 2

## 2016-04-28 MED ORDER — IOPAMIDOL (ISOVUE-300) INJECTION 61%
100.0000 mL | Freq: Once | INTRAVENOUS | Status: AC | PRN
  Administered 2016-04-28: 100 mL via INTRAVENOUS

## 2016-04-28 NOTE — ED Notes (Signed)
Having abdominal pain for last week.  Rates pain 5/10.  Also having hemorrhoid problems in last week.

## 2016-04-28 NOTE — ED Provider Notes (Signed)
CSN: [id number]     Arrival date & time 04/28/16  1157 History  By signing my name below, I, Jacqueline Cervantes, attest that this documentation has been prepared under the direction and in the presence of Jacqueline Pew, MD . Electronically Signed: Evelene Cervantes, Scribe. 04/28/2016. 1:00 PM.    Chief Complaint  Patient presents with  . Abdominal Pain    The history is provided by the patient and a relative (Daughter). No language interpreter was used.   HPI Comments:  Jacqueline Cervantes is a 81 y.o. female who presents to the Emergency Department complaining of moderate lower abdominal pain x ~ 1 week. She reports associated dysuria and decreased urinary output. No alleviating factors noted. She denies fever, nausea, and vomiting. Pt states she currently has hemorrhoids, reports increased rectal pain which she describes as a pressure over the last week. Her daughter also notes intermittent constipation. Pt is currently on stool softeners. Pt has h/o COPD, oxygen saturation at home was 88% per daughter.   Past Medical History  Diagnosis Date  . Hypercholesteremia   . Hypertension   . GERD (gastroesophageal reflux disease)   . Cancer of left breast ([place])   . Skin cancer     "face/nose"  . Type II diabetes mellitus ([place])   . COPD with acute exacerbation ([place])     Archie Endo 09/03/2015  . COPD with chronic bronchitis ([place])     Archie Endo 09/03/2015  . Arthritis     "fingers" (09/03/2015)  . Anxiety   . Depression   . On home oxygen therapy     "I use my husband's oxygen 2-3 times/day; 3L" (09/03/2015)   Past Surgical History  Procedure Laterality Date  . Breast biopsy Left   . Mastectomy Left 05/11/2006  . Abdominal hysterectomy    . Dilation and curettage of uterus    . Cataract extraction, bilateral Bilateral   . Skin cancer excision      "cut it off my nose; went right down the middle, on top"   Family History  Problem Relation Age of Onset  . Diabetes Mother   . Cancer Father     unknown    Social History  Substance Use Topics  . Smoking status: Current Every Day Smoker -- 0.50 packs/day for 52 years    Types: Cigarettes  . Smokeless tobacco: Never Used     Comment: Pt states she has been smoking intermittently for 52 years.   . Alcohol Use: No   OB History    Gravida Para Term Preterm AB TAB SAB Ectopic Multiple Living   '1 1 1       1     '$ Review of Systems  Constitutional: Negative for fever.  Gastrointestinal: Positive for abdominal pain and constipation. Negative for nausea and vomiting.  Genitourinary: Positive for dysuria and difficulty urinating.  All other systems reviewed and are negative.  Allergies  Review of patient's allergies indicates no known allergies.  Home Medications   Prior to Admission medications   Medication Sig Start Date End Date Taking? Authorizing Provider  acetaminophen (TYLENOL) 500 MG tablet Take 500 mg by mouth every 6 (six) hours as needed for mild pain.   Yes Historical Provider, MD  albuterol (PROVENTIL HFA;VENTOLIN HFA) 108 (90 BASE) MCG/ACT inhaler Inhale 2 puffs into the lungs every 6 (six) hours as needed for wheezing or shortness of breath. 10/16/15  Yes Tammy S Parrett, NP  alendronate (FOSAMAX) 70 MG tablet TAKE 1 TABLET BY MOUTH EVERY  WEEK WITH A FULL GLASS OF WATER ON AN EMPTY STOMACH ON FRIDAYS 07/28/15  Yes Mary-Margaret Hassell Done, FNP  ALPRAZolam Duanne Moron) 0.5 MG tablet TAKE 1 TABLET BY MOUTH TWICE A DAY AS NEEDED 03/07/16  Yes Mary-Margaret Hassell Done, FNP  amLODipine (NORVASC) 10 MG tablet Take 1 tablet (10 mg total) by mouth daily. 05/15/15  Yes Mary-Margaret Hassell Done, FNP  atorvastatin (LIPITOR) 40 MG tablet Take 1 tablet (40 mg total) by mouth daily. 05/15/15  Yes Mary-Margaret Hassell Done, FNP  citalopram (CELEXA) 20 MG tablet TAKE 1 TABLET EVERY DAY 03/21/16  Yes Mary-Margaret Hassell Done, FNP  fenofibrate 160 MG tablet Take 1 tablet (160 mg total) by mouth daily. 05/15/15  Yes Mary-Margaret Hassell Done, FNP  hydrochlorothiazide (MICROZIDE) 12.5  MG capsule Take 1 capsule (12.5 mg total) by mouth daily. 08/20/15  Yes Mary-Margaret Hassell Done, FNP  ipratropium-albuterol (DUONEB) 0.5-2.5 (3) MG/3ML SOLN Take 3 mLs by nebulization 4 (four) times daily as needed. 09/25/15  Yes Brand Males, MD  losartan (COZAAR) 25 MG tablet TAKE 1 TABLET (25 MG TOTAL) BY MOUTH DAILY. 03/28/16  Yes Mary-Margaret Hassell Done, FNP  metFORMIN (GLUCOPHAGE) 500 MG tablet TAKE 2 TABLETS IN THE MORNING AND 1 TABLET IN THE EVENING WITH MEALS. 03/21/16  Yes Mary-Margaret Hassell Done, FNP  Omega-3 Fatty Acids (FISH OIL PO) Take 1 capsule by mouth daily.   Yes Historical Provider, MD  polyethylene glycol (MIRALAX / GLYCOLAX) packet Take 17 g by mouth daily.   Yes Historical Provider, MD  cephALEXin (KEFLEX) 500 MG capsule Take 1 capsule (500 mg total) by mouth 4 (four) times daily. 04/28/16   Jacqueline Pew, MD   BP 137/64 mmHg  Pulse 93  Temp(Src) 98.7 F (37.1 C) (Oral)  Resp 14  Ht '5\' 5"'$  (1.651 m)  Wt 120 lb (54.432 kg)  BMI 19.97 kg/m2  SpO2 95% Physical Exam  Constitutional: She is oriented to person, place, and time. She appears well-developed and well-nourished. No distress.  HENT:  Head: Normocephalic and atraumatic.  Eyes: Conjunctivae are normal.  Cardiovascular: An irregular rhythm present. Tachycardia present.   Pulmonary/Chest: Effort normal. She has wheezes. She has rales.  wheezing bilaterally  mild crackles   Abdominal: She exhibits distension. There is tenderness.  Suprapubic, RUQ, and epigastric tenderness 2 cm firm area to left lower flank non-tender. non erythematous, non fluctuant   Genitourinary:  2 small external hemorrhoids, non thrombosed Chaperone was present for rectal exam which was performed with no discomfort or complications.   Neurological: She is alert and oriented to person, place, and time.  Skin: Skin is warm and dry.  Psychiatric: She has a normal mood and affect.  Nursing note and vitals reviewed.   ED Course  Procedures    DIAGNOSTIC STUDIES:  Oxygen Saturation is 90% on 2.5 L [place], low by my interpretation.    COORDINATION OF CARE:  12:32 PM Discussed treatment plan with pt at bedside and pt agreed to plan.  Labs Review Labs Reviewed  CBC WITH DIFFERENTIAL/PLATELET - Abnormal; Notable for the following:    Hemoglobin 11.6 (*)    RDW 16.3 (*)    All other components within normal limits  COMPREHENSIVE METABOLIC PANEL - Abnormal; Notable for the following:    Chloride 93 (*)    CO2 34 (*)    BUN 23 (*)    ALT 12 (*)    Alkaline Phosphatase 20 (*)    All other components within normal limits  URINALYSIS, ROUTINE W REFLEX MICROSCOPIC (NOT AT [facility]) - Abnormal; Notable for the  following:    Specific Gravity, Urine <1.005 (*)    Protein, ur TRACE (*)    Nitrite POSITIVE (*)    Leukocytes, UA SMALL (*)    All other components within normal limits  URINE MICROSCOPIC-ADD ON - Abnormal; Notable for the following:    Squamous Epithelial / LPF 0-5 (*)    Bacteria, UA MANY (*)    All other components within normal limits  URINE CULTURE  LACTIC ACID, PLASMA  LACTIC ACID, PLASMA  LIPASE, BLOOD    Imaging Review Ct Abdomen Pelvis W Contrast  04/28/2016  CLINICAL DATA:  Acute lower abdominal pain. EXAM: CT ABDOMEN AND PELVIS WITH CONTRAST TECHNIQUE: Multidetector CT imaging of the abdomen and pelvis was performed using the standard protocol following bolus administration of intravenous contrast. CONTRAST:  15m ISOVUE-300 IOPAMIDOL (ISOVUE-300) INJECTION 61% COMPARISON:  CT scan of June 18, 2013. FINDINGS: Old L1 compression fracture is noted. Visualized lung bases are unremarkable. No gallstones are noted. The liver, spleen and pancreas unremarkable. Adrenal glands and kidneys appear normal. No hydronephrosis or renal obstruction is noted. No renal or ureteral calculi are noted. Atherosclerosis of abdominal aorta is noted without aneurysm formation. There is no evidence of bowel obstruction. Sigmoid  diverticulosis is noted without inflammation. No abnormal fluid collection is noted. Status post hysterectomy. Ovaries are unremarkable. No significant adenopathy is noted. IMPRESSION: Atherosclerosis of abdominal aorta without aneurysm formation. Sigmoid diverticulosis without inflammation. No acute abnormality seen in the abdomen or pelvis. Electronically Signed   By: JMarijo Conception M.D.   On: 04/28/2016 15:52   I have personally reviewed and evaluated these images and lab results as part of my medical decision-making.   EKG Interpretation None      MDM   Final diagnoses:  UTI (lower urinary tract infection)    81yo F w/ UTI. CT and labs overall unremarkable for intraabdominal pathology. Also with slight bronchitis, given duoneb. Rocephin here, keflex as an outpatient.    New Prescriptions: Discharge Medication List as of 04/28/2016  5:13 PM    START taking these medications   Details  cephALEXin (KEFLEX) 500 MG capsule Take 1 capsule (500 mg total) by mouth 4 (four) times daily., Starting 04/28/2016, Until Discontinued, Print         I have personally and contemperaneously reviewed labs and imaging and used in my decision making as above.   A medical screening exam was performed and I feel the patient has had an appropriate workup for their chief complaint at this time and likelihood of emergent condition existing is low and thus workup can continue on an outpatient basis.. Their vital signs are stable. They have been counseled on decision, discharge, follow up and which symptoms necessitate immediate return to the emergency department.  They verbally stated understanding and agreement with plan and discharged in stable condition.    I personally performed the services described in this documentation, which was scribed in my presence. The recorded information has been reviewed and is accurate.     JMerrily Pew MD 04/28/16 2104

## 2016-04-29 DIAGNOSIS — J449 Chronic obstructive pulmonary disease, unspecified: Secondary | ICD-10-CM | POA: Diagnosis not present

## 2016-05-01 LAB — URINE CULTURE

## 2016-05-02 ENCOUNTER — Telehealth (HOSPITAL_BASED_OUTPATIENT_CLINIC_OR_DEPARTMENT_OTHER): Payer: Self-pay | Admitting: Emergency Medicine

## 2016-05-02 NOTE — Telephone Encounter (Signed)
Post ED Visit - Positive Culture Follow-up  Culture report reviewed by antimicrobial stewardship pharmacist:  '[]'$  Elenor Quinones, Pharm.D. '[]'$  Heide Guile, Pharm.D., BCPS '[]'$  Parks Neptune, Pharm.D. '[]'$  Alycia Rossetti, Pharm.D., BCPS '[]'$  [place], Pharm.D., BCPS, AAHIVP '[]'$  Legrand Como, Pharm.D., BCPS, AAHIVP '[x]'$  Milus Glazier, Pharm.D. '[]'$  Stephens November, Pharm.D.  Positive urine culture Treated with cephalexin, organism sensitive to the same and no further patient follow-up is required at this time.  Hazle Nordmann 05/02/2016, 10:35 AM

## 2016-05-03 ENCOUNTER — Telehealth: Payer: Self-pay | Admitting: Nurse Practitioner

## 2016-05-03 DIAGNOSIS — K649 Unspecified hemorrhoids: Secondary | ICD-10-CM

## 2016-05-03 DIAGNOSIS — K642 Third degree hemorrhoids: Secondary | ICD-10-CM | POA: Insufficient documentation

## 2016-05-03 DIAGNOSIS — O036 Delayed or excessive hemorrhage following complete or unspecified spontaneous abortion: Secondary | ICD-10-CM

## 2016-05-03 HISTORY — DX: Third degree hemorrhoids: K64.2

## 2016-05-03 NOTE — Telephone Encounter (Signed)
Hemorrhoids rarely needs surgical treatment, they can be banded by GI. In general I have a discussion about conservative treatment and then placed referral if necessary.  In this case I'm okay with referral to GI.  Laroy Apple, MD [facility] 05/03/2016, 2:18 PM

## 2016-05-03 NOTE — Telephone Encounter (Signed)
Is it ok to place referral for hemorrhoids?

## 2016-05-03 NOTE — Telephone Encounter (Signed)
Patients daughter aware

## 2016-05-04 DIAGNOSIS — J441 Chronic obstructive pulmonary disease with (acute) exacerbation: Secondary | ICD-10-CM | POA: Diagnosis not present

## 2016-05-12 ENCOUNTER — Ambulatory Visit (INDEPENDENT_AMBULATORY_CARE_PROVIDER_SITE_OTHER): Payer: Medicare Other | Admitting: Nurse Practitioner

## 2016-05-12 ENCOUNTER — Encounter: Payer: Self-pay | Admitting: Nurse Practitioner

## 2016-05-12 VITALS — BP 122/65 | HR 100 | Temp 97.8°F | Ht 65.0 in | Wt 118.0 lb

## 2016-05-12 DIAGNOSIS — F411 Generalized anxiety disorder: Secondary | ICD-10-CM | POA: Diagnosis not present

## 2016-05-12 DIAGNOSIS — E119 Type 2 diabetes mellitus without complications: Secondary | ICD-10-CM

## 2016-05-12 DIAGNOSIS — E785 Hyperlipidemia, unspecified: Secondary | ICD-10-CM

## 2016-05-12 DIAGNOSIS — Z720 Tobacco use: Secondary | ICD-10-CM | POA: Diagnosis not present

## 2016-05-12 DIAGNOSIS — J449 Chronic obstructive pulmonary disease, unspecified: Secondary | ICD-10-CM

## 2016-05-12 DIAGNOSIS — I10 Essential (primary) hypertension: Secondary | ICD-10-CM | POA: Diagnosis not present

## 2016-05-12 DIAGNOSIS — K640 First degree hemorrhoids: Secondary | ICD-10-CM | POA: Diagnosis not present

## 2016-05-12 LAB — BAYER DCA HB A1C WAIVED: HB A1C: 5.8 % (ref <7.0)

## 2016-05-12 MED ORDER — HYDROCHLOROTHIAZIDE 12.5 MG PO CAPS: 12.5000 mg | ORAL_CAPSULE | Freq: Every day | ORAL | Status: DC

## 2016-05-12 MED ORDER — AMLODIPINE BESYLATE 10 MG PO TABS: 10.0000 mg | ORAL_TABLET | Freq: Every day | ORAL | Status: DC

## 2016-05-12 MED ORDER — PREDNISONE 20 MG PO TABS: ORAL_TABLET | ORAL | Status: DC

## 2016-05-12 MED ORDER — CITALOPRAM HYDROBROMIDE 20 MG PO TABS: 20.0000 mg | ORAL_TABLET | Freq: Every day | ORAL | Status: DC

## 2016-05-12 MED ORDER — ATORVASTATIN CALCIUM 40 MG PO TABS: 40.0000 mg | ORAL_TABLET | Freq: Every day | ORAL | Status: DC

## 2016-05-12 MED ORDER — FENOFIBRATE 160 MG PO TABS: 160.0000 mg | ORAL_TABLET | Freq: Every day | ORAL | Status: DC

## 2016-05-12 MED ORDER — HYDROCORTISONE 2.5 % RE CREA: 1.0000 "application " | TOPICAL_CREAM | Freq: Two times a day (BID) | RECTAL | Status: DC

## 2016-05-12 NOTE — Progress Notes (Signed)
Subjective:    Patient ID: Jacqueline Cervantes, female    DOB: Oct 10, 1935, 81 y.o.   MRN: [id number]  Patient here today for follow up of chronic medical problems. She was seen several weeks ago with cough but that has gotten better.  Outpatient Encounter Prescriptions as of 05/12/2016  Medication Sig  . acetaminophen (TYLENOL) 500 MG tablet Take 500 mg by mouth every 6 (six) hours as needed for mild pain.  Marland Kitchen alendronate (FOSAMAX) 70 MG tablet TAKE 1 TABLET BY MOUTH EVERY WEEK WITH A FULL GLASS OF WATER ON AN EMPTY STOMACH ON FRIDAYS  . ALPRAZolam (XANAX) 0.5 MG tablet TAKE 1 TABLET BY MOUTH TWICE A DAY AS NEEDED  . amLODipine (NORVASC) 10 MG tablet Take 1 tablet (10 mg total) by mouth daily.  Marland Kitchen atorvastatin (LIPITOR) 40 MG tablet Take 1 tablet (40 mg total) by mouth daily.  . citalopram (CELEXA) 20 MG tablet TAKE 1 TABLET EVERY DAY  . fenofibrate 160 MG tablet Take 1 tablet (160 mg total) by mouth daily.  . hydrochlorothiazide (MICROZIDE) 12.5 MG capsule Take 1 capsule (12.5 mg total) by mouth daily.  Marland Kitchen ipratropium-albuterol (DUONEB) 0.5-2.5 (3) MG/3ML SOLN Take 3 mLs by nebulization 4 (four) times daily as needed.  Marland Kitchen losartan (COZAAR) 25 MG tablet TAKE 1 TABLET (25 MG TOTAL) BY MOUTH DAILY.  . metFORMIN (GLUCOPHAGE) 500 MG tablet TAKE 2 TABLETS IN THE MORNING AND 1 TABLET IN THE EVENING WITH MEALS.  Marland Kitchen Omega-3 Fatty Acids (FISH OIL PO) Take 1 capsule by mouth daily.  . polyethylene glycol (MIRALAX / GLYCOLAX) packet Take 17 g by mouth daily.            * she went to ER 04/28/16 with hemorrhoids and confusion- was dx with UTI and was given keflex. Doing better.   Hyperlipidemia This is a chronic problem. The current episode started more than 1 year ago. The problem is controlled. Recent lipid tests were reviewed and are normal. Exacerbating diseases include diabetes. She has no history of hypothyroidism or obesity. Pertinent negatives include no myalgias. Current antihyperlipidemic treatment  includes statins and fibric acid derivatives. The current treatment provides moderate improvement of lipids. Compliance problems include adherence to diet and adherence to exercise.  Risk factors for coronary artery disease include diabetes mellitus, dyslipidemia, hypertension, post-menopausal and a sedentary lifestyle.  Hypertension This is a chronic problem. The current episode started more than 1 year ago. The problem is unchanged. The problem is controlled. Pertinent negatives include no headaches. Risk factors for coronary artery disease include smoking/tobacco exposure, diabetes mellitus, dyslipidemia, post-menopausal state and sedentary lifestyle. Past treatments include calcium channel blockers and ACE inhibitors. The current treatment provides moderate improvement. Compliance problems include diet and exercise.   Diabetes She presents for her follow-up diabetic visit. She has type 2 diabetes mellitus. No MedicAlert identification noted. Her disease course has been fluctuating. Pertinent negatives for hypoglycemia include no headaches. Pertinent negatives for diabetes include no visual change. There are no hypoglycemic complications. Symptoms are stable. There are no diabetic complications. Risk factors for coronary artery disease include diabetes mellitus, dyslipidemia, family history, hypertension, post-menopausal, sedentary lifestyle and tobacco exposure. Current diabetic treatment includes oral agent (monotherapy). She is compliant with treatment most of the time. Her weight is stable. She is following a diabetic diet. When asked about meal planning, she reported none. She has not had a previous visit with a dietitian. She rarely participates in exercise. There is no change in her home blood glucose trend.  Her breakfast blood glucose is taken between 8-9 am. Her breakfast blood glucose range is generally 110-130 mg/dl. Her overall blood glucose range is 110-130 mg/dl. An ACE inhibitor/angiotensin II  receptor blocker is being taken. She does not see a podiatrist.Eye exam is not current.  Osteoporosis Pt currently taking alendronate 70 mg weekly. Pt states she is due for Dexascan in Aug 2016.  COPD Uses neb machine one time a day along with symbicort- Patient still SMOKES 2-3 x a day GAD Takes xanax usually just at night but occassionally needs during the day.    Review of Systems  Constitutional: Negative.   HENT: Negative.   Eyes: Negative.   Cardiovascular: Negative.   Gastrointestinal: Negative.   Endocrine: Negative.   Genitourinary: Negative.   Musculoskeletal: Negative.  Negative for myalgias.  Neurological: Negative.  Negative for headaches.  Hematological: Negative.   Psychiatric/Behavioral: Negative.   All other systems reviewed and are negative.      Objective:   Physical Exam  Constitutional: She is oriented to person, place, and time. She appears well-developed and well-nourished. No distress.  HENT:  Head: Normocephalic and atraumatic.  Right Ear: External ear normal.  Mouth/Throat: Oropharynx is clear and moist.  Eyes: Pupils are equal, round, and reactive to light.  Neck: Normal range of motion. Neck supple. No thyromegaly present.  Cardiovascular: Normal rate, regular rhythm and intact distal pulses.   Murmur (1/6 systolic murmur) heard. Pulmonary/Chest: Effort normal. No respiratory distress. She has wheezes (onsp and exp wheezes throughout).  Wheezes in bilateral lungs, nonproductive cough O2 via nasal cannula at 3l  Abdominal: Soft. Bowel sounds are normal. She exhibits no distension. There is no tenderness.  Musculoskeletal: Normal range of motion. She exhibits no edema or tenderness.  Neurological: She is alert and oriented to person, place, and time. She has normal reflexes. No cranial nerve deficit.  Skin: Skin is warm and dry.  Psychiatric: She has a normal mood and affect. Her behavior is normal. Judgment and thought content normal.  Vitals  reviewed.  BP 122/65 mmHg  Pulse 100  Temp(Src) 97.8 F (36.6 C) (Oral)  Ht '5\' 5"'  (1.651 m)  Wt 118 lb (53.524 kg)  BMI 19.64 kg/m2  SpO2 95%  HGBA1c 5.8%     Assessment & Plan:  1. Essential hypertension Do not add salt o diet - CMP14+EGFR - amLODipine (NORVASC) 10 MG tablet; Take 1 tablet (10 mg total) by mouth daily.  Dispense: 90 tablet; Refill: 3 - hydrochlorothiazide (MICROZIDE) 12.5 MG capsule; Take 1 capsule (12.5 mg total) by mouth daily.  Dispense: 90 capsule; Refill: 3  2. Type 2 diabetes mellitus without complication, without long-term current use of insulin (HCC) Continue to watch carbs in diet Blood sugar may go up some while on prednisone - [employer]ayer DCA Hb A1c Waived  3. HLD (hyperlipidemia) Low fat diet - Lipid panel - fenofibrate 160 MG tablet; Take 1 tablet (160 mg total) by mouth daily.  Dispense: 90 tablet; Refill: 3 - atorvastatin (LIPITOR) 40 MG tablet; Take 1 tablet (40 mg total) by mouth daily.  Dispense: 90 tablet; Refill: 3  4. COPD with chronic bronchitis ([place]) Stop smoking Need to follow up with ulmonologist - predniSONE (DELTASONE) 20 MG tablet; 2 po at sametime daily for 5 days  Dispense: 10 tablet; Refill: 0  5. Generalized anxiety disorder Stress management - citalopram (CELEXA) 20 MG tablet; Take 1 tablet (20 mg total) by mouth daily.  Dispense: 90 tablet; Refill: 1  6. Tobacco user  Again stop smoking  7. First degree hemorrhoids Do not strai to have bowel movement - hydrocortisone (PROCTOSOL HC) 2.5 % rectal cream; Place 1 application rectally 2 (two) times daily.  Dispense: 30 g; Refill: 0    Labs pending Health maintenance reviewed Diet and exercise encouraged Continue all meds Follow up  In 3 months   [place], FNP

## 2016-05-13 ENCOUNTER — Other Ambulatory Visit: Payer: Medicare Other

## 2016-05-13 DIAGNOSIS — E875 Hyperkalemia: Secondary | ICD-10-CM | POA: Diagnosis not present

## 2016-05-13 LAB — CMP14+EGFR
A/G RATIO: 1.5 (ref 1.2–2.2)
ALT: 15 IU/L (ref 0–32)
AST: 31 IU/L (ref 0–40)
Albumin: 4.5 g/dL (ref 3.5–4.8)
Alkaline Phosphatase: 19 IU/L — ABNORMAL LOW (ref 39–117)
BUN/Creatinine Ratio: 22 (ref 12–28)
BUN: 18 mg/dL (ref 8–27)
Bilirubin Total: 0.3 mg/dL (ref 0.0–1.2)
CALCIUM: 10.2 mg/dL (ref 8.7–10.3)
CO2: 29 mmol/L (ref 18–29)
CREATININE: 0.81 mg/dL (ref 0.57–1.00)
Chloride: 89 mmol/L — ABNORMAL LOW (ref 96–106)
GFR, EST AFRICAN AMERICAN: 80 mL/min/{1.73_m2} (ref >59)
GFR, EST NON AFRICAN AMERICAN: 70 mL/min/{1.73_m2} (ref >59)
Globulin, Total: 3.1 g/dL (ref 1.5–4.5)
Glucose: 116 mg/dL — ABNORMAL HIGH (ref 65–99)
POTASSIUM: 6 mmol/L — AB (ref 3.5–5.2)
Sodium: 139 mmol/L (ref 134–144)
TOTAL PROTEIN: 7.6 g/dL (ref 6.0–8.5)

## 2016-05-13 LAB — LIPID PANEL
CHOL/HDL RATIO: 2.6 ratio (ref 0.0–4.4)
Cholesterol, Total: 140 mg/dL (ref 100–199)
HDL: 54 mg/dL (ref >39)
LDL CALC: 50 mg/dL (ref 0–99)
TRIGLYCERIDES: 180 mg/dL — AB (ref 0–149)
VLDL CHOLESTEROL CAL: 36 mg/dL (ref 5–40)

## 2016-05-14 LAB — BMP8+EGFR
BUN / CREAT RATIO: 20 (ref 12–28)
BUN: 14 mg/dL (ref 8–27)
CALCIUM: 10 mg/dL (ref 8.7–10.3)
CHLORIDE: 91 mmol/L — AB (ref 96–106)
CO2: 31 mmol/L — ABNORMAL HIGH (ref 18–29)
Creatinine, Ser: 0.69 mg/dL (ref 0.57–1.00)
GFR calc non Af Amer: 84 mL/min/{1.73_m2} (ref >59)
GFR, EST AFRICAN AMERICAN: 96 mL/min/{1.73_m2} (ref >59)
GLUCOSE: 116 mg/dL — AB (ref 65–99)
POTASSIUM: 4.7 mmol/L (ref 3.5–5.2)
Sodium: 138 mmol/L (ref 134–144)

## 2016-05-21 ENCOUNTER — Telehealth: Payer: Self-pay | Admitting: Nurse Practitioner

## 2016-05-21 NOTE — Telephone Encounter (Signed)
Spoke with patient dtr re scheduling LTS visit. Per dtr due to other health concerns going on right now they will call back to schedule LTS visit after pulmonary visit on 6/5 as they are not going to schedule any other appointments until current issues are under control. Message routed to [facility].

## 2016-05-30 ENCOUNTER — Ambulatory Visit: Payer: Medicare Other | Admitting: Internal Medicine

## 2016-05-30 DIAGNOSIS — J449 Chronic obstructive pulmonary disease, unspecified: Secondary | ICD-10-CM | POA: Diagnosis not present

## 2016-05-31 ENCOUNTER — Ambulatory Visit (INDEPENDENT_AMBULATORY_CARE_PROVIDER_SITE_OTHER): Payer: Medicare Other | Admitting: Internal Medicine

## 2016-05-31 ENCOUNTER — Encounter: Payer: Self-pay | Admitting: Internal Medicine

## 2016-05-31 VITALS — BP 126/62 | HR 82 | Ht 65.0 in | Wt 124.4 lb

## 2016-05-31 DIAGNOSIS — J449 Chronic obstructive pulmonary disease, unspecified: Secondary | ICD-10-CM | POA: Diagnosis not present

## 2016-05-31 DIAGNOSIS — J9611 Chronic respiratory failure with hypoxia: Secondary | ICD-10-CM

## 2016-05-31 MED ORDER — TIOTROPIUM BROMIDE-OLODATEROL 2.5-2.5 MCG/ACT IN AERS: 2.0000 | INHALATION_SPRAY | Freq: Every day | RESPIRATORY_TRACT | Status: DC

## 2016-05-31 MED ORDER — BUDESONIDE 180 MCG/ACT IN AEPB: 2.0000 | INHALATION_SPRAY | Freq: Two times a day (BID) | RESPIRATORY_TRACT | Status: DC

## 2016-05-31 NOTE — Progress Notes (Signed)
Subjective:     Patient ID: Jacqueline Cervantes, female   DOB: 01/01/1935, 81 y.o.   MRN: [id number]  HPI   PCP [name], FNP  HPI  IOV 09/25/2015  Chief Complaint  Patient presents with  . Pulmonary Consult    Pt recently d/c from [facility] for COPD. Pt denies SOB and CP/tightness. Pt c/o mild prod cough with yellow mucus and wheezing.      81 year old female accompanied by her daughter. At baseline she has mild cough and mild shortness of breath. She is a very poor historian. Severity duration all unknown. Aggravated by  exertion. Relieved by rest. Insidious onset probably some many years ago. Associated cough is mild. Then 09/03/2015 was admitted for respiratory worsening. Was given a diagnosis of COPD exacerbation. Discharged 09/05/2015. This was the first time she was given a COPD diagnosis. She was discharged on oxygen. Her daughter who is here with her today states that there is a strong family history of COPD and many members are on oxygen. She is puzzled that her mother might have COPD because at rest on room air even after discharge saturations are fine and apparently nobody is given a diagnosis of COPD up until current admission. Currently patient's on Spiriva and Symbicort which she might not be taking correctly because of expensive cost. She is also on oxygen and compliance is unknown. She continues to smoke in interest in quitting smoking.  Patient's daughter and husband have COPD  Chest x-ray September 2016 done in the hospital but I personally visualized shows COPD    Normal serum creatinine summer 10 2016  ABG September 2016 shows mild hypercapnia with pH 7.3 6752 and a PO2 of 44    81 yo female smoker seen for pulmonary consult by Dr. Chase Caller in Sept 2016 for COPD    10/16/2015 Follow up : COPD   Pt underwent PFT today that FEV1 30%, ratio 55, no sign BD response, FVC 41%. Unable to complete DLCO maneuvers.  She was taken off Lisinopril due ongoing cough.  She  was given prednisone taper for active flare.  Changed from Symbicort to Duoneb and Flovent (d/t cost)  Did not have Flovent at pharmacy .  Remains on O2 at 2l/m  Alpha 1 was okay at 169 , MM phenotype.  She feels the nebs are helping her better.  Gets short of breath easily , has some cough on/off.  Flu shot utd    OV 05/31/2016  Chief Complaint  Patient presents with  . Follow-up    pt c/o worsening sob with exertion- pt is concerned that her duoneb makes her very shaky when taking.     ollow-up Gold stage III COPD o2 dependent chronic hypoxedmc resp failure  Here with daughter.Last OV with APP in oct 2016. OVerall stable but has had 5 pred bursts in itnerm; she hates side effects of prednisone. Main issue though stable says duoneb + alb prn is causing too much tremor. She is unclear if insuranc eissue is forcing her to use nebs but did say she has [place] without supplemental insurance. She says she has no part D. NEbs via [facility] home care via mail. Due to tremors stopped nebs few days ago and now wiuth increased wheeze but declines steroids. Willing to try mdi to avoid duoneb side effects and price it out    Hospital Account    Not on file        has a past medical history of Hypercholesteremia; Hypertension;  GERD (gastroesophageal reflux disease); Cancer of left breast ([place]); Skin cancer; Type II diabetes mellitus ([place]); COPD with acute exacerbation ([place]); COPD with chronic bronchitis ([place]); Arthritis; Anxiety; Depression; and On home oxygen therapy.   reports that she quit smoking about 3 weeks ago. Her smoking use included Cigarettes. She has a 26 pack-year smoking history. She has never used smokeless tobacco.  Past Surgical History  Procedure Laterality Date  . Breast biopsy Left   . Mastectomy Left 05/11/2006  . Abdominal hysterectomy    . Dilation and curettage of uterus    . Cataract extraction, bilateral Bilateral   . Skin cancer excision      "cut it off my nose;  went right down the middle, on top"    No Known Allergies  Immunization History  Administered Date(s) Administered  . Influenza,inj,Quad PF,36+ Mos 11/01/2013, 10/16/2014, 09/04/2015  . Pneumococcal Conjugate-13 05/15/2015    Family History  Problem Relation Age of Onset  . Diabetes Mother   . Cancer Father     unknown     Current outpatient prescriptions:  .  acetaminophen (TYLENOL) 500 MG tablet, Take 500 mg by mouth every 6 (six) hours as needed for mild pain., Disp: , Rfl:  .  alendronate (FOSAMAX) 70 MG tablet, TAKE 1 TABLET BY MOUTH EVERY WEEK WITH A FULL GLASS OF WATER ON AN EMPTY STOMACH ON FRIDAYS, Disp: 12 tablet, Rfl: 3 .  ALPRAZolam (XANAX) 0.5 MG tablet, TAKE 1 TABLET BY MOUTH TWICE A DAY AS NEEDED, Disp: 60 tablet, Rfl: 1 .  amLODipine (NORVASC) 10 MG tablet, Take 1 tablet (10 mg total) by mouth daily., Disp: 90 tablet, Rfl: 3 .  atorvastatin (LIPITOR) 40 MG tablet, Take 1 tablet (40 mg total) by mouth daily., Disp: 90 tablet, Rfl: 3 .  citalopram (CELEXA) 20 MG tablet, Take 1 tablet (20 mg total) by mouth daily., Disp: 90 tablet, Rfl: 1 .  fenofibrate 160 MG tablet, Take 1 tablet (160 mg total) by mouth daily., Disp: 90 tablet, Rfl: 3 .  hydrochlorothiazide (MICROZIDE) 12.5 MG capsule, Take 1 capsule (12.5 mg total) by mouth daily., Disp: 90 capsule, Rfl: 3 .  ipratropium-albuterol (DUONEB) 0.5-2.5 (3) MG/3ML SOLN, Take 3 mLs by nebulization 4 (four) times daily as needed., Disp: 360 mL, Rfl: 5 .  losartan (COZAAR) 25 MG tablet, TAKE 1 TABLET (25 MG TOTAL) BY MOUTH DAILY., Disp: 30 tablet, Rfl: 4 .  metFORMIN (GLUCOPHAGE) 500 MG tablet, TAKE 2 TABLETS IN THE MORNING AND 1 TABLET IN THE EVENING WITH MEALS., Disp: 270 tablet, Rfl: 1 .  Omega-3 Fatty Acids (FISH OIL PO), Take 1 capsule by mouth daily., Disp: , Rfl:  .  polyethylene glycol (MIRALAX / GLYCOLAX) packet, Take 17 g by mouth daily., Disp: , Rfl:     Review of Systems     Objective:   Physical Exam   Constitutional: She is oriented to person, place, and time. She appears well-developed and well-nourished. No distress.  Body mass index is 20.7 kg/(m^2).   HENT:  Head: Normocephalic and atraumatic.  Right Ear: External ear normal.  Left Ear: External ear normal.  Mouth/Throat: Oropharynx is clear and moist. No oropharyngeal exudate.  Eyes: Conjunctivae and EOM are normal. Pupils are equal, round, and reactive to light. Right eye exhibits no discharge. Left eye exhibits no discharge. No scleral icterus.  Neck: Normal range of motion. Neck supple. No JVD present. No tracheal deviation present. No thyromegaly present.  Cardiovascular: Normal rate, regular rhythm, normal heart sounds  and intact distal pulses.  Exam reveals no gallop and no friction rub.   No murmur heard. Pulmonary/Chest: Effort normal. No respiratory distress. She has wheezes. She has no rales. She exhibits no tenderness.  Abdominal: Soft. Bowel sounds are normal. She exhibits no distension and no mass. There is no tenderness. There is no rebound and no guarding.  Musculoskeletal: Normal range of motion. She exhibits no edema or tenderness.  Lymphadenopathy:    She has no cervical adenopathy.  Neurological: She is alert and oriented to person, place, and time. She has normal reflexes. No cranial nerve deficit. She exhibits normal muscle tone. Coordination normal.  Skin: Skin is warm and dry. No rash noted. She is not diaphoretic. No erythema. No pallor.  Psychiatric: She has a normal mood and affect. Her behavior is normal. Judgment and thought content normal.  Vitals reviewed.   Filed Vitals:   05/31/16 1605  BP: 126/62  Pulse: 82  Height: '5\' 5"'$  (1.651 m)  Weight: 124 lb 6.4 oz (56.427 kg)  SpO2: 92%   Estimated body mass index is 20.7 kg/(m^2) as calculated from the following:   Height as of this encounter: '5\' 5"'$  (1.651 m).   Weight as of this encounter: 124 lb 6.4 oz (56.427 kg).      Assessment:        ICD-9-CM ICD-10-CM   1. Chronic respiratory failure with hypoxia (HCC) 518.83 J96.11    799.02    2. COPD, severe ([place]) 496 J44.9        Plan:       Too bad you have intolerance to nebulizer INsurance might be an issue affording inhalers Right now you have some flare up because of lack of neb for few days COngratulations on quitting smoking  PLAN  - respect desire not to do prednsione despite flare up and wheeze - start stiolto daily - take sample and script and price it  - start pulmicort 178mg 2 puff twice daily - take sample and script and price it  - call [place] expensive and we can try for alternative   - continue oxygen as before  - smoking to stay in remission  Followup 3 months or sooner if needed  > 50% of this > 25 min visit spent in face to face counseling or coordination of care    Dr. MBrand Males M.D., FLake [facility] Transitional [facility] HospitalC.P Pulmonary and Critical Care Medicine Staff Physician CProvencalPulmonary and Critical Care Pager: [phone] If no answer or between  15:00h - 7:00h: call [phone]  05/31/2016 4:38 PM

## 2016-05-31 NOTE — Patient Instructions (Addendum)
ICD-9-CM ICD-10-CM   1. Chronic respiratory failure with hypoxia (HCC) 518.83 J96.11    799.02    2. COPD, severe ([place]) 496 J44.9     Too bad you have intolerance to nebulizer INsurance might be an issue affording inhalers Right now you have some flare up because of lack of neb for few days COngratulations on quitting smoking  PLAN  - respect desire not to do prednsione despite flare up and wheeze - start stiolto daily - take sample and script and price it  - start pulmicort 160mg 2 puff twice daily - take sample and script and price it  - call [place] inhal[place] expensive and we can try for alternative   - continue oxygen as before  - smoking to stay in remission  Followup 3 months or sooner if needed

## 2016-06-03 ENCOUNTER — Other Ambulatory Visit: Payer: Self-pay | Admitting: Nurse Practitioner

## 2016-06-03 NOTE — Telephone Encounter (Signed)
Rx called into pharmacy and pt is aware. 

## 2016-06-03 NOTE — Telephone Encounter (Signed)
Last seen 05/12/16 Last filled 03/07/16

## 2016-06-03 NOTE — Telephone Encounter (Signed)
Please call in xanax with 1 refills 

## 2016-06-04 DIAGNOSIS — J441 Chronic obstructive pulmonary disease with (acute) exacerbation: Secondary | ICD-10-CM | POA: Diagnosis not present

## 2016-06-09 ENCOUNTER — Telehealth: Payer: Self-pay | Admitting: Internal Medicine

## 2016-06-09 NOTE — Telephone Encounter (Signed)
Patient Instructions       ICD-9-CM ICD-10-CM   1. Chronic respiratory failure with hypoxia (HCC) 518.83 J96.11    799.02    2. COPD, severe ([place]) 496 J44.9     Too bad you have intolerance to nebulizer INsurance might be an issue affording inhalers Right now you have some flare up because of lack of neb for few days COngratulations on quitting smoking  PLAN - respect desire not to do prednsione despite flare up and wheeze - start stiolto daily - take sample and script and price it - start pulmicort 12mg 2 puff twice daily - take sample and script and price it  - call [place] expensive and we can try for alternative  - continue oxygen as before  - smoking to stay in remission  Followup 3 months or sooner if needed   Attempted to contact pt. No answer, no option to leave a message. Will try back.

## 2016-06-10 NOTE — Telephone Encounter (Signed)
lmtcb x1 for pt. 

## 2016-06-13 NOTE — Telephone Encounter (Signed)
Spoke with pt, states that pulmicort and stiolto are too expensive and would like an alternative.  Aware that MR is out of town.   MR please advise on alternative inhalers.  Thanks!

## 2016-06-14 ENCOUNTER — Other Ambulatory Visit: Payer: Self-pay | Admitting: Nurse Practitioner

## 2016-06-15 NOTE — Telephone Encounter (Signed)
Called and spoke with Jerene Pitch at [employer] - states that Breo 128mg is going to cost $203.99/month and Incruse is going to cost $203.99/month Current meds Pulmicort and Stiolto - Stiolto is showing as costing $203.99/month. Pulmicort is not covered buy insurance, pt must try and fail alternatives - which is Incruse.  Pharmacist thinks that the patient has to meet a deductible every year before insurance pays for medications.  Pt was advised last OV to start 2 new inhalers - Pulmicort and Stiolto - states that they have not filled these inhalers as they are too expensive. Pt has been using samples since OV. Aware that insurance is not covering the inhalers and the patient's husband states that he thinks that they have do have a deductible for medications and they have to pay out of pocket before insurance will pick up and cover anything.   Please advise Dr RChase Callerof any other alternative medications the patient can try. Pt is aware that most all inhalers that we can give are probably going to be too expensive in the beginning until the deductible is met. Pt states that they cannot afford to pay out over $400/month for just 2 inhalers with having other medications to pay for.

## 2016-06-15 NOTE — Telephone Encounter (Signed)
lmtcb for pt.  

## 2016-06-15 NOTE — Telephone Encounter (Signed)
Price Brio low dose + Incruse

## 2016-06-15 NOTE — Telephone Encounter (Signed)
She can go back to duoneb 4 times daily + generic flovent mdi. The nebs were giving side effects but you can tell her that is alternative. [facility] care agency wil be cheaper  IF she is very adamant against nebs then give her sampls

## 2016-06-17 NOTE — Telephone Encounter (Signed)
Patient notified of Dr. Golden Pop recommendations. Nothing further needed.

## 2016-07-04 DIAGNOSIS — J441 Chronic obstructive pulmonary disease with (acute) exacerbation: Secondary | ICD-10-CM | POA: Diagnosis not present

## 2016-07-14 DIAGNOSIS — D225 Melanocytic nevi of trunk: Secondary | ICD-10-CM | POA: Diagnosis not present

## 2016-07-14 DIAGNOSIS — D1801 Hemangioma of skin and subcutaneous tissue: Secondary | ICD-10-CM | POA: Diagnosis not present

## 2016-07-14 DIAGNOSIS — L814 Other melanin hyperpigmentation: Secondary | ICD-10-CM | POA: Diagnosis not present

## 2016-07-14 DIAGNOSIS — L821 Other seborrheic keratosis: Secondary | ICD-10-CM | POA: Diagnosis not present

## 2016-07-14 DIAGNOSIS — L309 Dermatitis, unspecified: Secondary | ICD-10-CM | POA: Diagnosis not present

## 2016-07-20 ENCOUNTER — Other Ambulatory Visit: Payer: Self-pay | Admitting: Family Medicine

## 2016-07-20 ENCOUNTER — Ambulatory Visit
Admission: RE | Admit: 2016-07-20 | Discharge: 2016-07-20 | Disposition: A | Discharge status: Home / Self Care | Payer: Medicare Other | Source: Ambulatory Visit | Attending: Family Medicine | Admitting: Family Medicine

## 2016-07-20 DIAGNOSIS — Z1231 Encounter for screening mammogram for malignant neoplasm of breast: Secondary | ICD-10-CM

## 2016-07-22 ENCOUNTER — Other Ambulatory Visit: Payer: Self-pay | Admitting: Family Medicine

## 2016-07-22 DIAGNOSIS — R928 Other abnormal and inconclusive findings on diagnostic imaging of breast: Secondary | ICD-10-CM

## 2016-07-28 ENCOUNTER — Ambulatory Visit
Admission: RE | Admit: 2016-07-28 | Discharge: 2016-07-28 | Disposition: A | Discharge status: Home / Self Care | Payer: Medicare Other | Source: Ambulatory Visit | Attending: Family Medicine | Admitting: Family Medicine

## 2016-07-28 DIAGNOSIS — R928 Other abnormal and inconclusive findings on diagnostic imaging of breast: Secondary | ICD-10-CM

## 2016-07-28 DIAGNOSIS — R922 Inconclusive mammogram: Secondary | ICD-10-CM | POA: Diagnosis not present

## 2016-08-01 ENCOUNTER — Other Ambulatory Visit: Payer: Self-pay | Admitting: Nurse Practitioner

## 2016-08-01 NOTE — Telephone Encounter (Signed)
Please call in xanax with 1 refills 

## 2016-08-01 NOTE — Telephone Encounter (Signed)
Last filled 07/02/16, last seen 05/12/16. Phone in

## 2016-08-04 DIAGNOSIS — J441 Chronic obstructive pulmonary disease with (acute) exacerbation: Secondary | ICD-10-CM | POA: Diagnosis not present

## 2016-08-09 DIAGNOSIS — H3530 Unspecified macular degeneration: Secondary | ICD-10-CM | POA: Diagnosis not present

## 2016-08-09 DIAGNOSIS — J449 Chronic obstructive pulmonary disease, unspecified: Secondary | ICD-10-CM | POA: Diagnosis not present

## 2016-08-23 ENCOUNTER — Encounter: Payer: Self-pay | Admitting: Family Medicine

## 2016-08-23 ENCOUNTER — Ambulatory Visit (INDEPENDENT_AMBULATORY_CARE_PROVIDER_SITE_OTHER): Payer: Medicare Other | Admitting: Family Medicine

## 2016-08-23 VITALS — BP 120/67 | HR 95 | Temp 98.8°F | Ht 65.0 in | Wt 129.2 lb

## 2016-08-23 DIAGNOSIS — F17201 Nicotine dependence, unspecified, in remission: Secondary | ICD-10-CM | POA: Diagnosis not present

## 2016-08-23 DIAGNOSIS — E089 Diabetes mellitus due to underlying condition without complications: Secondary | ICD-10-CM

## 2016-08-23 DIAGNOSIS — J449 Chronic obstructive pulmonary disease, unspecified: Secondary | ICD-10-CM | POA: Diagnosis not present

## 2016-08-23 DIAGNOSIS — I10 Essential (primary) hypertension: Secondary | ICD-10-CM | POA: Diagnosis not present

## 2016-08-23 LAB — BAYER DCA HB A1C WAIVED: HB A1C (BAYER DCA - WAIVED): 6 % (ref <7.0)

## 2016-08-23 MED ORDER — FLUTICASONE FUROATE-VILANTEROL 200-25 MCG/INH IN AEPB: 1.0000 | INHALATION_SPRAY | Freq: Every day | RESPIRATORY_TRACT | 11 refills | Status: DC

## 2016-08-23 NOTE — Patient Instructions (Signed)
Great to see you!  Take breo, 1 puff once a day    Come back in 1 month for follow up

## 2016-08-23 NOTE — Progress Notes (Signed)
   HPI  Patient presents today here to follow-up for COPD, cough, and diabetes.  Cough and COPD Patient's on 2 L of oxygen via nasal cannula 24 hours a She states that she has increased cough at night. She has been using DuoNeb's 4 times a day on average, as needed She states that albuterol does help. She does not have any controller medications at this time due to cost.  She was given breo clinic and taught how to take the first puff. 6 weeks of samples were given.  Patient has major limitations with medications due to lack of insurance coverage for medications.  She understands not to take the other inhalers with breo  Diabetes Average fasting blood sugar is 130-140 She does watch her diet She is unable to exercise due to dyspnea.   PMH: Smoking status noted ROS: Per HPI  Objective: BP 120/67   Pulse 95   Temp 98.8 F (37.1 C) (Oral)   Ht '5\' 5"'$  (1.651 m)   Wt 129 lb 3.2 oz (58.6 kg)   BMI 21.50 kg/m  Gen: NAD, alert, cooperative with exam HEENT: NCAT CV: RRR, good S1/S2, no murmur Resp: Nonlabored, expiratory wheezes throughout on exam, 2 L of oxygen via nasal cannula Ext: No edema, warm Neuro: Alert and oriented, No gross deficits  Assessment and plan:  # COPD Severe, likely end-stage COPD Patient cannot afford any controller inhalers, I have given her samples that we had available today,breo Return to clinic in one month, we'll gladly give her additional samples if there are available that time. Appreciate pulmonology's hard work for her, we will also check in to see if there something she could do about medication coverage  # type 2 diabetes Reasonably well controlled, A1c 6.0 Continue metformin, creatinine is very well controlled  # Tobacco abuse in remission Congratulated, encouraged her to continue not smoking  # Hypertension Well-controlled, continue losartan, HCTZ, amlodipine  She did have stiolto and pulmicort Rx's, she does not have these  currently She understands not to overlap medications   Orders Placed This Encounter  Procedures  . Bayer DCA Hb A1c Waived    Meds ordered this encounter  Medications  . fluticasone furoate-vilanterol (BREO ELLIPTA) 200-25 MCG/INH AEPB    Sig: Inhale 1 puff into the lungs daily.    Dispense:  28 each    Refill:  [place], MD [facility] 08/23/2016, 3:43 PM

## 2016-08-24 ENCOUNTER — Telehealth: Payer: Self-pay | Admitting: Family Medicine

## 2016-08-24 NOTE — Telephone Encounter (Signed)
Covering PCP, please advise.

## 2016-08-24 NOTE — Telephone Encounter (Signed)
We are out of Breo samples.

## 2016-08-24 NOTE — Telephone Encounter (Signed)
Please give patient samples of Breo

## 2016-09-04 DIAGNOSIS — J441 Chronic obstructive pulmonary disease with (acute) exacerbation: Secondary | ICD-10-CM | POA: Diagnosis not present

## 2016-09-05 ENCOUNTER — Ambulatory Visit: Payer: Medicare Other | Admitting: Internal Medicine

## 2016-09-16 DIAGNOSIS — J449 Chronic obstructive pulmonary disease, unspecified: Secondary | ICD-10-CM | POA: Diagnosis not present

## 2016-09-28 ENCOUNTER — Encounter: Payer: Self-pay | Admitting: Internal Medicine

## 2016-09-28 ENCOUNTER — Ambulatory Visit (INDEPENDENT_AMBULATORY_CARE_PROVIDER_SITE_OTHER): Payer: Medicare Other | Admitting: Internal Medicine

## 2016-09-28 VITALS — BP 100/62 | HR 96 | Ht 65.0 in | Wt 133.2 lb

## 2016-09-28 DIAGNOSIS — J9611 Chronic respiratory failure with hypoxia: Secondary | ICD-10-CM | POA: Diagnosis not present

## 2016-09-28 DIAGNOSIS — J449 Chronic obstructive pulmonary disease, unspecified: Secondary | ICD-10-CM

## 2016-09-28 NOTE — Progress Notes (Signed)
Subjective:     Patient ID: Jacqueline Cervantes, female   DOB: [birth year], 81 y.o.   MRN: [id number]  HPI  PCP [name], FNP  HPI  IOV 09/25/2015  Chief Complaint  Patient presents with  . Pulmonary Consult    Pt recently d/c from [facility] for COPD. Pt denies SOB and CP/tightness. Pt c/o mild prod cough with yellow mucus and wheezing.      81 year old female accompanied by her daughter. At baseline she has mild cough and mild shortness of breath. She is a very poor historian. Severity duration all unknown. Aggravated by  exertion. Relieved by rest. Insidious onset probably some many years ago. Associated cough is mild. Then 09/03/2015 was admitted for respiratory worsening. Was given a diagnosis of COPD exacerbation. Discharged 09/05/2015. This was the first time she was given a COPD diagnosis. She was discharged on oxygen. Her daughter who is here with her today states that there is a strong family history of COPD and many members are on oxygen. She is puzzled that her mother might have COPD because at rest on room air even after discharge saturations are fine and apparently nobody is given a diagnosis of COPD up until current admission. Currently patient's on Spiriva and Symbicort which she might not be taking correctly because of expensive cost. She is also on oxygen and compliance is unknown. She continues to smoke in interest in quitting smoking.  Patient's daughter and husband have COPD  Chest x-ray September 2016 done in the hospital but I personally visualized shows COPD    Normal serum creatinine summer 10 2016  ABG September 2016 shows mild hypercapnia with pH 7.3 6752 and a PO2 of 59    81 yo female smoker seen for pulmonary consult by Dr. Chase Caller in Sept 2016 for COPD    10/16/2015 Follow up : COPD   Pt underwent PFT today that FEV1 30%, ratio 55, no sign BD response, FVC 41%. Unable to complete DLCO maneuvers.  She was taken off Lisinopril due ongoing cough.  She  was given prednisone taper for active flare.  Changed from Symbicort to Duoneb and Flovent (d/t cost)  Did not have Flovent at pharmacy .  Remains on O2 at 2l/m  Alpha 1 was okay at 169 , MM phenotype.  She feels the nebs are helping her better.  Gets short of breath easily , has some cough on/off.  Flu shot utd    OV 05/31/2016  Chief Complain  t  Patient presents with  . Follow-up    pt c/o worsening sob with exertion- pt is concerned that her duoneb makes her very shaky when taking.     ollow-up Gold stage III COPD o2 dependent chronic hypoxedmc resp failure  Here with daughter.Last OV with APP in oct 2016. OVerall stable but has had 5 pred bursts in itnerm; she hates side effects of prednisone. Main issue though stable says duoneb + alb prn is causing too much tremor. She is unclear if insuranc eissue is forcing her to use nebs but did say she has [place] without supplemental insurance. She says she has no part D. NEbs via [facility] home care via mail. Due to tremors stopped nebs few days ago and now wiuth increased wheeze but declines steroids. Willing to try mdi to avoid duoneb side effects and price it out    OV 09/28/2016  Chief Complaint  Patient presents with  . Follow-up    pt states breo is working well. pt  quit smoking  about 13 weeks ago. still using O2  at 2L, No new complaints. Discuss getting flu vaccine today.    81 year old frail COPD patient with Gold stage III and chronic hypoxic respiratory failure. Presents for routine 3 month follow-up. Smoking still in remission. Presents with her daughter were also has emphysema. Husband is emphysema. Her alpha 1 is MM. Again due to insurance issues and she has gone through a change with her COPD medication regimen. Currently she is taking Brio samples once a day. She is unable to focus because each time insurance $300. She does not have supplemental insurance. However she is able to prove part D Medicare uses DuoNeb now 4 times  daily which she is tolerating. The Brio works better. She does not want to change the regimen Brio daily and DuoNeb 4 times daily. She deferred. With [place] and will have it with her primary care physician. She refused to go to rehabilitation due to largest issues     has a past medical history of Anxiety; Arthritis; Cancer of left breast ([place]); COPD with acute exacerbation ([place]); COPD with chronic bronchitis ([place]); Depression; GERD (gastroesophageal reflux disease); Hypercholesteremia; Hypertension; On home oxygen therapy; Skin cancer; and Type II diabetes mellitus ([place]).   reports that she quit smoking about 4 months ago. Her smoking use included Cigarettes. She has a 26.00 pack-year smoking history. She has never used smokeless tobacco.  Past Surgical History:  Procedure Laterality Date  . ABDOMINAL HYSTERECTOMY    . BREAST BIOPSY Left   . CATARACT EXTRACTION, BILATERAL Bilateral   . DILATION AND CURETTAGE OF UTERUS    . MASTECTOMY Left 05/11/2006  . SKIN CANCER EXCISION     "cut it off my nose; went right down the middle, on top"    No Known Allergies  Immunization History  Administered Date(s) Administered  . Influenza,inj,Quad PF,36+ Mos 11/01/2013, 10/16/2014, 09/04/2015  . Pneumococcal Conjugate-13 05/15/2015    Family History  Problem Relation Age of Onset  . Diabetes Mother   . Cancer Father     unknown     Current Outpatient Prescriptions:  .  acetaminophen (TYLENOL) 500 MG tablet, Take 500 mg by mouth every 6 (six) hours as needed for mild pain., Disp: , Rfl:  .  alendronate (FOSAMAX) 70 MG tablet, TAKE 1 TABLET BY MOUTH EVERY WEEK WITH A FULL GLASS OF WATER ON AN EMPTY STOMACH ON FRIDAYS, Disp: 12 tablet, Rfl: 1 .  ALPRAZolam (XANAX) 0.5 MG tablet, TAKE 1 TABLET BY MOUTH TWICE A DAY AS NEEDED, Disp: 60 tablet, Rfl: 1 .  amLODipine (NORVASC) 10 MG tablet, Take 1 tablet (10 mg total) by mouth daily., Disp: 90 tablet, Rfl: 3 .  atorvastatin (LIPITOR) 40 MG tablet, Take 1  tablet (40 mg total) by mouth daily., Disp: 90 tablet, Rfl: 3 .  citalopram (CELEXA) 20 MG tablet, Take 1 tablet (20 mg total) by mouth daily., Disp: 90 tablet, Rfl: 1 .  fenofibrate 160 MG tablet, Take 1 tablet (160 mg total) by mouth daily., Disp: 90 tablet, Rfl: 3 .  fluticasone furoate-vilanterol (BREO ELLIPTA) 200-25 MCG/INH AEPB, Inhale 1 puff into the lungs daily., Disp: 28 each, Rfl: 11 .  hydrochlorothiazide (MICROZIDE) 12.5 MG capsule, Take 1 capsule (12.5 mg total) by mouth daily., Disp: 90 capsule, Rfl: 3 .  ipratropium-albuterol (DUONEB) 0.5-2.5 (3) MG/3ML SOLN, Take 3 mLs by nebulization 4 (four) times daily as needed., Disp: 360 mL, Rfl: 5 .  losartan (COZAAR) 25 MG tablet, TAKE 1  TABLET (25 MG TOTAL) BY MOUTH DAILY., Disp: 30 tablet, Rfl: 4 .  metFORMIN (GLUCOPHAGE) 500 MG tablet, TAKE 2 TABLETS IN THE MORNING AND 1 TABLET IN THE EVENING WITH MEALS., Disp: 270 tablet, Rfl: 1 .  Omega-3 Fatty Acids (FISH OIL PO), Take 1 capsule by mouth daily., Disp: , Rfl:  .  polyethylene glycol (MIRALAX / GLYCOLAX) packet, Take 17 g by mouth daily., Disp: , Rfl:    Review of Systems     Objective:   Physical Exam  Constitutional: She is oriented to person, place, and time. No distress.  Frail female  HENT:  Head: Normocephalic and atraumatic.  Right Ear: External ear normal.  Left Ear: External ear normal.  Mouth/Throat: Oropharynx is clear and moist. No oropharyngeal exudate.  Eyes: Conjunctivae and EOM are normal. Pupils are equal, round, and reactive to light. Right eye exhibits no discharge. Left eye exhibits no discharge. No scleral icterus.  Neck: Normal range of motion. Neck supple. No JVD present. No tracheal deviation present. No thyromegaly present.  Cardiovascular: Normal rate, regular rhythm, normal heart sounds and intact distal pulses.  Exam reveals no gallop and no friction rub.   No murmur heard. Pulmonary/Chest: Effort normal and breath sounds normal. No respiratory  distress. She has no wheezes. She has no rales. She exhibits no tenderness.  Abdominal: Soft. Bowel sounds are normal. She exhibits no distension and no mass. There is no tenderness. There is no rebound and no guarding.  Musculoskeletal: Normal range of motion. She exhibits no edema or tenderness.  Lymphadenopathy:    She has no cervical adenopathy.  Neurological: She is alert and oriented to person, place, and time. She has normal reflexes. No cranial nerve deficit. She exhibits normal muscle tone. Coordination normal.  Skin: Skin is warm and dry. No rash noted. She is not diaphoretic. No erythema. No pallor.  Dry skin  Psychiatric: She has a normal mood and affect. Her behavior is normal. Judgment and thought content normal.  Vitals reviewed.   Vitals:   09/28/16 1606  BP: 100/62  Pulse: 96  SpO2: 95%  Weight: 133 lb 3.2 oz (60.4 kg)  Height: '5\' 5"'$  (1.651 m)        Assessment:       ICD-9-CM ICD-10-CM   1. COPD, severe ([place]) 496 J44.9   2. Chronic respiratory failure with hypoxia (HCC) 518.83 J96.11    799.02         Plan:      COPD stable INsurance might be an issue affording inhalers COngratulations on quitting smoking and staying quit Too bad you cannot attend rehab due to logistics  PLAN  - ok for breo once daily per your personal protocol- take sample   - duoneb 4 times daily and as needed  - continue oxygen as before - smoking to stay in remission - flu shot next week with pcp Mary-Margaret Hassell Done, FNP - will mark as deferrd with [place]  Followup 6 months or sooner if needed   Dr. Brand Males, M.D., [facility].C.P Pulmonary and Critical Care Medicine Staff Physician [facility] Pager: [phone], If no answer or between  15:00h - 7:00h: call [phone]  09/28/2016 5:03 PM

## 2016-09-28 NOTE — Patient Instructions (Addendum)
ICD-9-CM ICD-10-CM   1. COPD, severe ([place]) 496 J44.9   2. Chronic respiratory failure with hypoxia (HCC) 518.83 J96.11    799.02     COPD stable INsurance might be an issue affording inhalers COngratulations on quitting smoking and staying quit Too bad you cannot attend rehab due to logistics  PLAN  - ok for breo once daily per your personal protocol- take sample   - duoneb 4 times daily and as needed  - continue oxygen as before - smoking to stay in remission - flu shot next week with pcp Mary-Margaret Hassell Done, FNP - will mark as deferrd with [place]  Followup 6 months or sooner if needed

## 2016-10-03 ENCOUNTER — Other Ambulatory Visit: Payer: Self-pay | Admitting: Nurse Practitioner

## 2016-10-04 DIAGNOSIS — J441 Chronic obstructive pulmonary disease with (acute) exacerbation: Secondary | ICD-10-CM | POA: Diagnosis not present

## 2016-10-15 ENCOUNTER — Other Ambulatory Visit: Payer: Self-pay | Admitting: Nurse Practitioner

## 2016-10-18 ENCOUNTER — Telehealth: Payer: Self-pay | Admitting: Internal Medicine

## 2016-10-18 MED ORDER — IPRATROPIUM-ALBUTEROL 0.5-2.5 (3) MG/3ML IN SOLN: 3.0000 mL | Freq: Four times a day (QID) | RESPIRATORY_TRACT | 5 refills | Status: DC | PRN

## 2016-10-18 NOTE — Telephone Encounter (Signed)
Rx sent electronically to [facility]

## 2016-10-20 DIAGNOSIS — G510 Bell's palsy: Secondary | ICD-10-CM | POA: Diagnosis not present

## 2016-10-21 ENCOUNTER — Ambulatory Visit (INDEPENDENT_AMBULATORY_CARE_PROVIDER_SITE_OTHER): Payer: Medicare Other | Admitting: Pediatrics

## 2016-10-21 ENCOUNTER — Encounter: Payer: Self-pay | Admitting: Pediatrics

## 2016-10-21 VITALS — BP 127/65 | HR 94 | Temp 98.0°F | Ht 65.0 in | Wt 135.6 lb

## 2016-10-21 DIAGNOSIS — G510 Bell's palsy: Secondary | ICD-10-CM

## 2016-10-21 DIAGNOSIS — Z23 Encounter for immunization: Secondary | ICD-10-CM | POA: Diagnosis not present

## 2016-10-21 DIAGNOSIS — E785 Hyperlipidemia, unspecified: Secondary | ICD-10-CM

## 2016-10-21 DIAGNOSIS — J449 Chronic obstructive pulmonary disease, unspecified: Secondary | ICD-10-CM | POA: Diagnosis not present

## 2016-10-21 DIAGNOSIS — I10 Essential (primary) hypertension: Secondary | ICD-10-CM | POA: Diagnosis not present

## 2016-10-21 NOTE — Progress Notes (Signed)
  Subjective:   Patient ID: Jacqueline Cervantes, female    DOB: 07/01/1935, 81 y.o.   MRN: [id number] CC: Follow-up (Bells Palsy)  HPI: Jacqueline Cervantes is a 81 y.o. female presenting for Follow-up (Bells Palsy)  Recently diagnosed with Bells Palsy Started having symptoms of R side facial weakness 1 week ago Not able to fully close R eye at first, improved now Symptoms now improving, able to close eye more, open R eye more, smile better R side of mouth Went to ophthalmologist yesterday, diagnosed with Bells palsy No weakness in extremities Sensation slightly dulled R side of face All of face affected-- R side of forehead, eye, mouth No h/o stroke, heart attacks Can see fine out of R eye now per pt, was at first slightly blurry Using eye ointment regularly throughout the day  Breathing is at baseline, no worse than usual On oxygen 24h a day  Relevant past medical, surgical, family and social history reviewed. Allergies and medications reviewed and updated. History  Smoking Status  . Former Smoker  . Packs/day: 0.50  . Years: 52.00  . Types: Cigarettes  . Quit date: 05/10/2016  Smokeless Tobacco  . Never Used    Comment: Pt states she has been smoking intermittently for 52 years.    ROS: Per HPI   Objective:    BP 127/65   Pulse 94   Temp 98 F (36.7 C) (Oral)   Ht '5\' 5"'$  (1.651 m)   Wt 135 lb 9.6 oz (61.5 kg)   BMI 22.57 kg/m   Wt Readings from Last 3 Encounters:  10/21/16 135 lb 9.6 oz (61.5 kg)  09/28/16 133 lb 3.2 oz (60.4 kg)  08/23/16 129 lb 3.2 oz (58.6 kg)    Gen: NAD, alert, cooperative with exam, NCAT EYES: EOMI, no conjunctival injection, or no icterus ENT:  OP without erythema LYMPH: no cervical LAD CV: NRRR, normal S1/S2 Resp: wheezes b/l with expiration, comfortable WOB Ext: No edema, warm Neuro: Alert and oriented, speech fine, strength 5/5 hand grip b/l, ext/flex shoulders, elbows, knees, b/l. Gait normal. Ptosis present R eye compared with L, can close  R eye completely, can open enough for equal gaze. Can raise R eye brow slightly, normal eye brow raise L side. Can engage muscles for smile R side, normal smile L side. Feels touch both sides of face, decreased sensation R side compared with L side Skin: no vesicles or rash over R side of face or around ear  Assessment & Plan:  Jacqueline Cervantes was seen today for follow-up bells palsy.  Diagnoses and all orders for this visit:  Bell's palsy Symptoms started one week ago Both forehead and lower face involved Improving symptoms/weakness Imaging in 3 weeks if does not continue to improve  Encounter for immunization -     Flu Vaccine QUAD 36+ mos IM  COPD On oxygen No worsening of symptoms Cont home meds   HLD Cont pravastatin  HTN Well controlled today, cont current meds  Follow up plan: 3 weeks [name], MD [place]

## 2016-10-21 NOTE — Patient Instructions (Addendum)
Bell Palsy °Bell palsy is a condition in which the muscles on one side of the face become paralyzed. This often causes one side of the face to droop. It is a common condition and most people recover completely. °RISK FACTORS °Risk factors for Bell palsy include: °· Pregnancy. °· Diabetes. °· An infection by a virus, such as infections that cause cold sores. °CAUSES  °Bell palsy is caused by damage to or inflammation of a nerve in your face. It is unclear why this happens, but an infection by a virus may lead to it. Most of the time the reason it happens is unknown. °SIGNS AND SYMPTOMS  °Symptoms can range from mild to severe and can take place over a number of hours. Symptoms may include: °· Being unable to: °¨ Raise one or both eyebrows. °¨ Close one or both eyes. °¨ Feel parts of your face (facial numbness). °· Drooping of the eyelid and corner of the mouth. °· Weakness in the face. °· Paralysis of half your face. °· Loss of taste. °· Sensitivity to loud noises. °· Difficulty chewing. °· Tearing up of the affected eye. °· Dryness in the affected eye. °· Drooling. °· Pain behind one ear. °DIAGNOSIS  °Diagnosis of Bell palsy may include: °· A medical history and physical exam. °· An MRI. °· A CT scan. °· Electromyography (EMG). This is a test that checks how your nerves are working. °TREATMENT  °Treatment may include antiviral medicine to help shorten the length of the condition. Sometimes treatment is not needed and the symptoms go away on their own. °HOME CARE INSTRUCTIONS  °· Take medicines only as directed by your health care provider. °· Do facial massages and exercises as directed by your health care provider. °· If your eye is affected: °¨ Use moisturizing eye drops to prevent drying of your eye as directed by your health care provider. °¨ Protect your eye as directed by your health care provider. °SEEK MEDICAL CARE IF: °· Your symptoms do not get better or get worse. °· You are drooling. °· Your eye is red,  irritated, or hurts. °SEEK IMMEDIATE MEDICAL CARE IF:  °· Another part of your body feels weak or numb. °· You have difficulty swallowing. °· You have a fever along with symptoms of Bell palsy. °· You develop neck pain. °MAKE SURE YOU:  °· Understand these instructions. °· Will watch your condition. °· Will get help right away if you are not doing well or get worse. °  °This information is not intended to replace advice given to you by your health care provider. Make sure you discuss any questions you have with your health care provider. °  °Document Released: 12/12/2005 Document Revised: 09/02/2015 Document Reviewed: 03/21/2014 °Elsevier Interactive Patient Education ©2016 [employer] ° °

## 2016-10-24 ENCOUNTER — Telehealth: Payer: Self-pay | Admitting: Nurse Practitioner

## 2016-10-24 MED ORDER — BUDESONIDE-FORMOTEROL FUMARATE 160-4.5 MCG/ACT IN AERO: 2.0000 | INHALATION_SPRAY | Freq: Two times a day (BID) | RESPIRATORY_TRACT | 3 refills | Status: DC

## 2016-10-24 NOTE — Telephone Encounter (Signed)
Can try symbiciort, can give Quentin, MD [facility] 10/24/2016, 3:21 PM

## 2016-10-24 NOTE — Telephone Encounter (Signed)
Dr. Wendi Snipes,  Patient cannot afford her [facility] and we are out of samples.  What would you like to do?

## 2016-10-26 ENCOUNTER — Other Ambulatory Visit: Payer: Self-pay | Admitting: *Deleted

## 2016-10-26 DIAGNOSIS — G510 Bell's palsy: Secondary | ICD-10-CM | POA: Diagnosis not present

## 2016-10-26 MED ORDER — BUDESONIDE-FORMOTEROL FUMARATE 160-4.5 MCG/ACT IN AERO: 2.0000 | INHALATION_SPRAY | Freq: Two times a day (BID) | RESPIRATORY_TRACT | 3 refills | Status: DC

## 2016-10-26 NOTE — Telephone Encounter (Signed)
They only fill nebulizer meds. Resent to [facility]

## 2016-11-01 ENCOUNTER — Other Ambulatory Visit: Payer: Self-pay | Admitting: *Deleted

## 2016-11-01 ENCOUNTER — Other Ambulatory Visit: Payer: Self-pay | Admitting: Nurse Practitioner

## 2016-11-01 ENCOUNTER — Telehealth: Payer: Self-pay | Admitting: Internal Medicine

## 2016-11-01 MED ORDER — IPRATROPIUM-ALBUTEROL 0.5-2.5 (3) MG/3ML IN SOLN: 3.0000 mL | Freq: Four times a day (QID) | RESPIRATORY_TRACT | 5 refills | Status: DC | PRN

## 2016-11-01 MED ORDER — LOSARTAN POTASSIUM 25 MG PO TABS: ORAL_TABLET | ORAL | 4 refills | Status: DC

## 2016-11-01 NOTE — Telephone Encounter (Signed)
Spoke with Claruda with [facility] and made her aware that I have resent this medication. Nothing further needed.

## 2016-11-03 DIAGNOSIS — J449 Chronic obstructive pulmonary disease, unspecified: Secondary | ICD-10-CM | POA: Diagnosis not present

## 2016-11-04 DIAGNOSIS — J441 Chronic obstructive pulmonary disease with (acute) exacerbation: Secondary | ICD-10-CM | POA: Diagnosis not present

## 2016-11-07 ENCOUNTER — Other Ambulatory Visit: Payer: Self-pay

## 2016-11-07 MED ORDER — LOSARTAN POTASSIUM 25 MG PO TABS: ORAL_TABLET | ORAL | 1 refills | Status: DC

## 2016-11-10 ENCOUNTER — Telehealth: Payer: Self-pay | Admitting: Nurse Practitioner

## 2016-11-10 MED ORDER — FLUTICASONE FUROATE-VILANTEROL 100-25 MCG/INH IN AEPB: 1.0000 | INHALATION_SPRAY | Freq: Every day | RESPIRATORY_TRACT | 0 refills | Status: DC

## 2016-11-10 NOTE — Telephone Encounter (Signed)
breo 100 was started at the PULM dr - she likes it and wants to cont --- but too expensive -- samples given today and daughter aware

## 2016-11-26 ENCOUNTER — Other Ambulatory Visit: Payer: Self-pay | Admitting: Nurse Practitioner

## 2016-11-29 ENCOUNTER — Ambulatory Visit (INDEPENDENT_AMBULATORY_CARE_PROVIDER_SITE_OTHER): Payer: Medicare Other | Admitting: Nurse Practitioner

## 2016-11-29 ENCOUNTER — Encounter: Payer: Self-pay | Admitting: Nurse Practitioner

## 2016-11-29 VITALS — BP 131/70 | HR 86 | Temp 97.3°F | Ht 65.0 in | Wt 135.0 lb

## 2016-11-29 DIAGNOSIS — I10 Essential (primary) hypertension: Secondary | ICD-10-CM | POA: Diagnosis not present

## 2016-11-29 DIAGNOSIS — J449 Chronic obstructive pulmonary disease, unspecified: Secondary | ICD-10-CM

## 2016-11-29 DIAGNOSIS — E782 Mixed hyperlipidemia: Secondary | ICD-10-CM

## 2016-11-29 DIAGNOSIS — E089 Diabetes mellitus due to underlying condition without complications: Secondary | ICD-10-CM

## 2016-11-29 DIAGNOSIS — L989 Disorder of the skin and subcutaneous tissue, unspecified: Secondary | ICD-10-CM | POA: Diagnosis not present

## 2016-11-29 DIAGNOSIS — M810 Age-related osteoporosis without current pathological fracture: Secondary | ICD-10-CM | POA: Diagnosis not present

## 2016-11-29 DIAGNOSIS — F411 Generalized anxiety disorder: Secondary | ICD-10-CM

## 2016-11-29 DIAGNOSIS — E785 Hyperlipidemia, unspecified: Secondary | ICD-10-CM | POA: Diagnosis not present

## 2016-11-29 LAB — BAYER DCA HB A1C WAIVED: HB A1C: 5.8 % (ref <7.0)

## 2016-11-29 MED ORDER — BUDESONIDE-FORMOTEROL FUMARATE 160-4.5 MCG/ACT IN AERO: 2.0000 | INHALATION_SPRAY | Freq: Two times a day (BID) | RESPIRATORY_TRACT | 3 refills | Status: DC

## 2016-11-29 MED ORDER — HYDROCHLOROTHIAZIDE 12.5 MG PO CAPS: 12.5000 mg | ORAL_CAPSULE | Freq: Every day | ORAL | 3 refills | Status: DC

## 2016-11-29 MED ORDER — CITALOPRAM HYDROBROMIDE 20 MG PO TABS: 20.0000 mg | ORAL_TABLET | Freq: Every day | ORAL | 1 refills | Status: DC

## 2016-11-29 MED ORDER — ALBUTEROL SULFATE HFA 108 (90 BASE) MCG/ACT IN AERS: 2.0000 | INHALATION_SPRAY | Freq: Four times a day (QID) | RESPIRATORY_TRACT | 0 refills | Status: DC | PRN

## 2016-11-29 MED ORDER — FENOFIBRATE 160 MG PO TABS: 160.0000 mg | ORAL_TABLET | Freq: Every day | ORAL | 3 refills | Status: DC

## 2016-11-29 MED ORDER — ALBUTEROL SULFATE HFA 108 (90 BASE) MCG/ACT IN AERS: 2.0000 | INHALATION_SPRAY | Freq: Four times a day (QID) | RESPIRATORY_TRACT | 0 refills | Status: AC | PRN

## 2016-11-29 MED ORDER — AMLODIPINE BESYLATE 10 MG PO TABS: 10.0000 mg | ORAL_TABLET | Freq: Every day | ORAL | 3 refills | Status: DC

## 2016-11-29 MED ORDER — ATORVASTATIN CALCIUM 40 MG PO TABS: 40.0000 mg | ORAL_TABLET | Freq: Every day | ORAL | 3 refills | Status: DC

## 2016-11-29 MED ORDER — FLUTICASONE FUROATE-VILANTEROL 100-25 MCG/INH IN AEPB: 1.0000 | INHALATION_SPRAY | Freq: Every day | RESPIRATORY_TRACT | 0 refills | Status: DC

## 2016-11-29 MED ORDER — LOSARTAN POTASSIUM 25 MG PO TABS: ORAL_TABLET | ORAL | 1 refills | Status: DC

## 2016-11-29 NOTE — Progress Notes (Signed)
Subjective:    Patient ID: Jacqueline Cervantes, female    DOB: 03-06-1935, 81 y.o.   MRN: [id number]  Patient here today for follow up of chronic medical problems. No changes since last follow up visit- no complaints today.  Outpatient Encounter Prescriptions as of 05/12/2016  Medication Sig  . acetaminophen (TYLENOL) 500 MG tablet Take 500 mg by mouth every 6 (six) hours as needed for mild pain.  Marland Kitchen alendronate (FOSAMAX) 70 MG tablet TAKE 1 TABLET BY MOUTH EVERY WEEK WITH A FULL GLASS OF WATER ON AN EMPTY STOMACH ON FRIDAYS  . ALPRAZolam (XANAX) 0.5 MG tablet TAKE 1 TABLET BY MOUTH TWICE A DAY AS NEEDED  . amLODipine (NORVASC) 10 MG tablet Take 1 tablet (10 mg total) by mouth daily.  Marland Kitchen atorvastatin (LIPITOR) 40 MG tablet Take 1 tablet (40 mg total) by mouth daily.  . citalopram (CELEXA) 20 MG tablet TAKE 1 TABLET EVERY DAY  . fenofibrate 160 MG tablet Take 1 tablet (160 mg total) by mouth daily.  . hydrochlorothiazide (MICROZIDE) 12.5 MG capsule Take 1 capsule (12.5 mg total) by mouth daily.  Marland Kitchen ipratropium-albuterol (DUONEB) 0.5-2.5 (3) MG/3ML SOLN Take 3 mLs by nebulization 4 (four) times daily as needed.  Marland Kitchen losartan (COZAAR) 25 MG tablet TAKE 1 TABLET (25 MG TOTAL) BY MOUTH DAILY.  . metFORMIN (GLUCOPHAGE) 500 MG tablet TAKE 2 TABLETS IN THE MORNING AND 1 TABLET IN THE EVENING WITH MEALS.  Marland Kitchen Omega-3 Fatty Acids (FISH OIL PO) Take 1 capsule by mouth daily.  . polyethylene glycol (MIRALAX / GLYCOLAX) packet Take 17 g by mouth daily.             Hyperlipidemia  This is a chronic problem. The current episode started more than 1 year ago. The problem is controlled. Recent lipid tests were reviewed and are normal. Exacerbating diseases include diabetes. She has no history of hypothyroidism or obesity. Pertinent negatives include no myalgias. Current antihyperlipidemic treatment includes statins and fibric acid derivatives. The current treatment provides moderate improvement of lipids. Compliance  problems include adherence to diet and adherence to exercise.  Risk factors for coronary artery disease include diabetes mellitus, dyslipidemia, hypertension, post-menopausal and a sedentary lifestyle.  Hypertension  This is a chronic problem. The current episode started more than 1 year ago. The problem is unchanged. The problem is controlled. Pertinent negatives include no headaches. Risk factors for coronary artery disease include smoking/tobacco exposure, diabetes mellitus, dyslipidemia, post-menopausal state and sedentary lifestyle. Past treatments include calcium channel blockers and ACE inhibitors. The current treatment provides moderate improvement. Compliance problems include diet and exercise.   Diabetes  She presents for her follow-up diabetic visit. She has type 2 diabetes mellitus. No MedicAlert identification noted. Her disease course has been fluctuating. Pertinent negatives for hypoglycemia include no headaches. Pertinent negatives for diabetes include no visual change. There are no hypoglycemic complications. Symptoms are stable. There are no diabetic complications. Risk factors for coronary artery disease include diabetes mellitus, dyslipidemia, family history, hypertension, post-menopausal, sedentary lifestyle and tobacco exposure. Current diabetic treatment includes oral agent (monotherapy). She is compliant with treatment most of the time. Her weight is stable. She is following a diabetic diet. When asked about meal planning, she reported none. She has not had a previous visit with a dietitian. She rarely participates in exercise. Home blood sugar record trend: patient has not been checking blood sugars at home. An ACE inhibitor/angiotensin II receptor blocker is being taken. She does not see a podiatrist.Eye exam is  not current.  Osteoporosis Pt currently taking alendronate 70 mg weekly. Pt states she is due for Dexascan in Aug 2016.  COPD Uses neb machine one time a day along with  symbicort- Patient still SMOKES 2-3 x a day GAD Takes xanax usually just at night but occassionally needs during the day.    Review of Systems  Constitutional: Negative.   HENT: Negative.   Eyes: Negative.   Cardiovascular: Negative.   Gastrointestinal: Negative.   Endocrine: Negative.   Genitourinary: Negative.   Musculoskeletal: Negative.  Negative for myalgias.  Neurological: Negative.  Negative for headaches.  Hematological: Negative.   Psychiatric/Behavioral: Negative.   All other systems reviewed and are negative.      Objective:   Physical Exam  Constitutional: She is oriented to person, place, and time. She appears well-developed and well-nourished. No distress.  HENT:  Head: Normocephalic and atraumatic.  Right Ear: External ear normal.  Mouth/Throat: Oropharynx is clear and moist.  Eyes: Pupils are equal, round, and reactive to light.  Neck: Normal range of motion. Neck supple. No thyromegaly present.  Cardiovascular: Normal rate, regular rhythm and intact distal pulses.   Murmur (1/6 systolic murmur) heard. Pulmonary/Chest: Effort normal. No respiratory distress. She has wheezes (onsp and exp wheezes throughout).  Wheezes in bilateral lungs, nonproductive cough O2 via nasal cannula at 3l  Abdominal: Soft. Bowel sounds are normal. She exhibits no distension. There is no tenderness.  Musculoskeletal: Normal range of motion. She exhibits no edema or tenderness.  Neurological: She is alert and oriented to person, place, and time. She has normal reflexes. No cranial nerve deficit.  Skin: Skin is warm and dry.  Brown macular facial lesions  Psychiatric: She has a normal mood and affect. Her behavior is normal. Judgment and thought content normal.  Vitals reviewed.  BP 131/70   Pulse 86   Temp 97.3 F (36.3 C) (Oral)   Ht '5\' 5"'  (1.651 m)   Wt 135 lb (61.2 kg)   SpO2 95%   BMI 22.47 kg/m    o2 86-88% at room air  HGBA1c 5.8%     Assessment & Plan:  1.  Diabetes mellitus due to underlying condition without complication, unspecified long term insulin use status (HCC) Continue to watch carbs in diet - Bayer DCA Hb A1c Waived  2. Essential hypertension Low sodium diet - CMP14+EGFR - hydrochlorothiazide (MICROZIDE) 12.5 MG capsule; Take 1 capsule (12.5 mg total) by mouth daily.  Dispense: 90 capsule; Refill: 3 - amLODipine (NORVASC) 10 MG tablet; Take 1 tablet (10 mg total) by mouth daily.  Dispense: 90 tablet; Refill: 3 - losartan (COZAAR) 25 MG tablet; TAKE 1 TABLET (25 MG TOTAL) BY MOUTH DAILY.  Dispense: 90 tablet; Refill: 1  3. Hyperlipidemia, unspecified hyperlipidemia type Low fat diet - Lipid panel  4. COPD, severe ([place]) Keep follow up appointment with pulmonologist STOP SMOKING - budesonide-formoterol (SYMBICORT) 160-4.5 MCG/ACT inhaler; Inhale 2 puffs into the lungs 2 (two) times daily.  Dispense: 1 Inhaler; Refill: 3 - fluticasone furoate-vilanterol (BREO ELLIPTA) 100-25 MCG/INH AEPB; Inhale 1 puff into the lungs daily.  Dispense: 1 each; Refill: 0 - albuterol (PROVENTIL HFA;VENTOLIN HFA) 108 (90 Base) MCG/ACT inhaler; Inhale 2 puffs into the lungs every 6 (six) hours as needed for wheezing or shortness of breath.  Dispense: 1 Inhaler; Refill: 0 - albuterol (PROVENTIL HFA;VENTOLIN HFA) 108 (90 Base) MCG/ACT inhaler; Inhale 2 puffs into the lungs every 6 (six) hours as needed for wheezing or shortness of breath.  Dispense:  1 Inhaler; Refill: 0  5. Age-related osteoporosis without current pathological fracture Weight bearing exercises as can tolerate  6. Generalized anxiety disorder Stress management - citalopram (CELEXA) 20 MG tablet; Take 1 tablet (20 mg total) by mouth daily.  Dispense: 90 tablet; Refill: 1  7. Skin lesion of face - Ambulatory referral to Dermatology  8. Mixed hyperlipidemia Low fat diet - atorvastatin (LIPITOR) 40 MG tablet; Take 1 tablet (40 mg total) by mouth daily.  Dispense: 90 tablet; Refill: 3 -  fenofibrate 160 MG tablet; Take 1 tablet (160 mg total) by mouth daily.  Dispense: 90 tablet; Refill: 3    Labs pending Health maintenance reviewed Diet and exercise encouraged Continue all meds Follow up  In 3 months   [place], FNP

## 2016-11-29 NOTE — Patient Instructions (Signed)
Chronic Obstructive Pulmonary Disease Chronic obstructive pulmonary disease (COPD) is a common lung problem. In COPD, the flow of air from the lungs is limited. The way your lungs work will probably never return to normal, but there are things you can do to improve your lungs and make yourself feel better. Your doctor may treat your condition with:  Medicines.  Oxygen.  Lung surgery.  Changes to your diet.  Rehabilitation. This may involve a team of specialists. Follow these instructions at home:  Take all medicines as told by your doctor.  Avoid medicines or cough syrups that dry up your airway (such as antihistamines) and do not allow you to get rid of thick spit. You do not need to avoid them if told differently by your doctor.  If you smoke, stop. Smoking makes the problem worse.  Avoid being around things that make your breathing worse (like smoke, chemicals, and fumes).  Use oxygen therapy and therapy to help improve your lungs (pulmonary rehabilitation) if told by your doctor. If you need home oxygen therapy, ask your doctor if you should buy a tool to measure your oxygen level (oximeter).  Avoid people who have a sickness you can catch (contagious).  Avoid going outside when it is very hot, cold, or humid.  Eat healthy foods. Eat smaller meals more often. Rest before meals.  Stay active, but remember to also rest.  Make sure to get all the shots (vaccines) your doctor recommends. Ask your doctor if you need a pneumonia shot.  Learn and use tips on how to relax.  Learn and use tips on how to control your breathing as told by your doctor. Try: 1. Breathing in (inhaling) through your nose for 1 second. Then, pucker your lips and breath out (exhale) through your lips for 2 seconds. 2. Putting one hand on your belly (abdomen). Breathe in slowly through your nose for 1 second. Your hand on your belly should move out. Pucker your lips and breathe out slowly through your lips.  Your hand on your belly should move in as you breathe out.  Learn and use controlled coughing to clear thick spit from your lungs. The steps are: 1. Lean your head a little forward. 2. Breathe in deeply. 3. Try to hold your breath for 3 seconds. 4. Keep your mouth slightly open while coughing 2 times. 5. Spit any thick spit out into a tissue. 6. Rest and do the steps again 1 or 2 times as needed. Contact a doctor if:  You cough up more thick spit than usual.  There is a change in the color or thickness of the spit.  It is harder to breathe than usual.  Your breathing is faster than usual. Get help right away if:  You have shortness of breath while resting.  You have shortness of breath that stops you from:  Being able to talk.  Doing normal activities.  You chest hurts for longer than 5 minutes.  Your skin color is more blue than usual.  Your pulse oximeter shows that you have low oxygen for longer than 5 minutes. This information is not intended to replace advice given to you by your health care provider. Make sure you discuss any questions you have with your health care provider. Document Released: 05/30/2008 Document Revised: 05/19/2016 Document Reviewed: 08/08/2013 Elsevier Interactive Patient Education  2017 [employer].

## 2016-11-30 LAB — CMP14+EGFR
ALK PHOS: 22 IU/L — AB (ref 39–117)
ALT: 13 IU/L (ref 0–32)
AST: 12 IU/L (ref 0–40)
Albumin/Globulin Ratio: 1.7 (ref 1.2–2.2)
Albumin: 4.6 g/dL (ref 3.5–4.8)
BUN/Creatinine Ratio: 18 (ref 12–28)
BUN: 15 mg/dL (ref 8–27)
Bilirubin Total: <0.2 mg/dL (ref 0.0–1.2)
CALCIUM: 10.4 mg/dL — AB (ref 8.7–10.3)
CO2: 32 mmol/L — AB (ref 18–29)
CREATININE: 0.83 mg/dL (ref 0.57–1.00)
Chloride: 93 mmol/L — ABNORMAL LOW (ref 96–106)
GFR calc Af Amer: 78 mL/min/{1.73_m2} (ref >59)
GFR, EST NON AFRICAN AMERICAN: 67 mL/min/{1.73_m2} (ref >59)
GLUCOSE: 83 mg/dL (ref 65–99)
Globulin, Total: 2.7 g/dL (ref 1.5–4.5)
Potassium: 4.6 mmol/L (ref 3.5–5.2)
Sodium: 139 mmol/L (ref 134–144)
Total Protein: 7.3 g/dL (ref 6.0–8.5)

## 2016-11-30 LAB — LIPID PANEL
CHOL/HDL RATIO: 2.6 ratio (ref 0.0–4.4)
CHOLESTEROL TOTAL: 115 mg/dL (ref 100–199)
HDL: 45 mg/dL (ref >39)
LDL CALC: 32 mg/dL (ref 0–99)
TRIGLYCERIDES: 192 mg/dL — AB (ref 0–149)
VLDL CHOLESTEROL CAL: 38 mg/dL (ref 5–40)

## 2016-12-03 ENCOUNTER — Other Ambulatory Visit: Payer: Self-pay | Admitting: Nurse Practitioner

## 2016-12-04 DIAGNOSIS — J441 Chronic obstructive pulmonary disease with (acute) exacerbation: Secondary | ICD-10-CM | POA: Diagnosis not present

## 2016-12-05 NOTE — Telephone Encounter (Signed)
Please call in xanax with 1 refills 

## 2016-12-05 NOTE — Telephone Encounter (Signed)
Last filled 11/02/16, last seen 11/29/16. Call in

## 2016-12-05 NOTE — Telephone Encounter (Signed)
RX called into [facility] okayed per MMM

## 2016-12-21 DIAGNOSIS — L570 Actinic keratosis: Secondary | ICD-10-CM | POA: Diagnosis not present

## 2016-12-21 DIAGNOSIS — L821 Other seborrheic keratosis: Secondary | ICD-10-CM | POA: Diagnosis not present

## 2016-12-23 DIAGNOSIS — J449 Chronic obstructive pulmonary disease, unspecified: Secondary | ICD-10-CM | POA: Diagnosis not present

## 2016-12-28 ENCOUNTER — Ambulatory Visit: Payer: Medicare Other | Admitting: Nurse Practitioner

## 2017-01-04 DIAGNOSIS — J441 Chronic obstructive pulmonary disease with (acute) exacerbation: Secondary | ICD-10-CM | POA: Diagnosis not present

## 2017-01-13 ENCOUNTER — Other Ambulatory Visit: Payer: Self-pay | Admitting: Family

## 2017-01-23 DIAGNOSIS — J449 Chronic obstructive pulmonary disease, unspecified: Secondary | ICD-10-CM | POA: Diagnosis not present

## 2017-02-03 ENCOUNTER — Other Ambulatory Visit: Payer: Self-pay | Admitting: Nurse Practitioner

## 2017-02-04 DIAGNOSIS — J441 Chronic obstructive pulmonary disease with (acute) exacerbation: Secondary | ICD-10-CM | POA: Diagnosis not present

## 2017-02-04 NOTE — Telephone Encounter (Signed)
Last filled 01/02/17, last seen 11/29/16. Route to pool for call in

## 2017-02-07 NOTE — Telephone Encounter (Signed)
Xanax refill called to [facility], [employer].

## 2017-02-27 ENCOUNTER — Other Ambulatory Visit: Payer: Self-pay | Admitting: Pediatrics

## 2017-02-27 ENCOUNTER — Emergency Department (HOSPITAL_COMMUNITY)
Admission: EM | Admit: 2017-02-27 | Discharge: 2017-02-27 | Disposition: A | Discharge status: Home / Self Care | Payer: Medicare Other | Attending: Emergency Medicine | Admitting: Emergency Medicine

## 2017-02-27 ENCOUNTER — Encounter (HOSPITAL_COMMUNITY): Payer: Self-pay | Admitting: Emergency Medicine

## 2017-02-27 ENCOUNTER — Emergency Department (HOSPITAL_COMMUNITY): Payer: Medicare Other

## 2017-02-27 DIAGNOSIS — Z79899 Other long term (current) drug therapy: Secondary | ICD-10-CM | POA: Diagnosis not present

## 2017-02-27 DIAGNOSIS — E119 Type 2 diabetes mellitus without complications: Secondary | ICD-10-CM | POA: Diagnosis not present

## 2017-02-27 DIAGNOSIS — Z7984 Long term (current) use of oral hypoglycemic drugs: Secondary | ICD-10-CM | POA: Diagnosis not present

## 2017-02-27 DIAGNOSIS — Z87891 Personal history of nicotine dependence: Secondary | ICD-10-CM | POA: Diagnosis not present

## 2017-02-27 DIAGNOSIS — J9611 Chronic respiratory failure with hypoxia: Secondary | ICD-10-CM | POA: Diagnosis not present

## 2017-02-27 DIAGNOSIS — K529 Noninfective gastroenteritis and colitis, unspecified: Secondary | ICD-10-CM | POA: Insufficient documentation

## 2017-02-27 DIAGNOSIS — K760 Fatty (change of) liver, not elsewhere classified: Secondary | ICD-10-CM | POA: Diagnosis not present

## 2017-02-27 DIAGNOSIS — I10 Essential (primary) hypertension: Secondary | ICD-10-CM | POA: Diagnosis not present

## 2017-02-27 DIAGNOSIS — J449 Chronic obstructive pulmonary disease, unspecified: Secondary | ICD-10-CM | POA: Insufficient documentation

## 2017-02-27 DIAGNOSIS — R1084 Generalized abdominal pain: Secondary | ICD-10-CM | POA: Diagnosis present

## 2017-02-27 LAB — URINALYSIS, ROUTINE W REFLEX MICROSCOPIC
BILIRUBIN URINE: NEGATIVE
Glucose, UA: NEGATIVE mg/dL
KETONES UR: NEGATIVE mg/dL
LEUKOCYTES UA: NEGATIVE
NITRITE: POSITIVE — AB
PROTEIN: 30 mg/dL — AB
Specific Gravity, Urine: 1.017 (ref 1.005–1.030)
pH: 6 (ref 5.0–8.0)

## 2017-02-27 LAB — COMPREHENSIVE METABOLIC PANEL
ALBUMIN: 4.3 g/dL (ref 3.5–5.0)
ALT: 10 U/L — ABNORMAL LOW (ref 14–54)
AST: 18 U/L (ref 15–41)
Alkaline Phosphatase: 21 U/L — ABNORMAL LOW (ref 38–126)
Anion gap: 11 (ref 5–15)
BILIRUBIN TOTAL: 0.2 mg/dL — AB (ref 0.3–1.2)
BUN: 16 mg/dL (ref 6–20)
CO2: 31 mmol/L (ref 22–32)
Calcium: 10.2 mg/dL (ref 8.9–10.3)
Chloride: 90 mmol/L — ABNORMAL LOW (ref 101–111)
Creatinine, Ser: 0.71 mg/dL (ref 0.44–1.00)
GFR calc Af Amer: >60 mL/min (ref >60)
GFR calc non Af Amer: >60 mL/min (ref >60)
GLUCOSE: 102 mg/dL — AB (ref 65–99)
POTASSIUM: 4 mmol/L (ref 3.5–5.1)
Sodium: 132 mmol/L — ABNORMAL LOW (ref 135–145)
TOTAL PROTEIN: 8 g/dL (ref 6.5–8.1)

## 2017-02-27 LAB — CBC
HEMATOCRIT: 37.7 % (ref 36.0–46.0)
Hemoglobin: 12.3 g/dL (ref 12.0–15.0)
MCH: 26.3 pg (ref 26.0–34.0)
MCHC: 32.6 g/dL (ref 30.0–36.0)
MCV: 80.7 fL (ref 78.0–100.0)
Platelets: 442 10*3/uL — ABNORMAL HIGH (ref 150–400)
RBC: 4.67 MIL/uL (ref 3.87–5.11)
RDW: 16.2 % — AB (ref 11.5–15.5)
WBC: 9 10*3/uL (ref 4.0–10.5)

## 2017-02-27 LAB — LIPASE, BLOOD: Lipase: 22 U/L (ref 11–51)

## 2017-02-27 MED ORDER — IOPAMIDOL (ISOVUE-300) INJECTION 61%
INTRAVENOUS | Status: AC
  Administered 2017-02-27: 30 mL
  Filled 2017-02-27: qty 30

## 2017-02-27 MED ORDER — IOPAMIDOL (ISOVUE-300) INJECTION 61%
100.0000 mL | Freq: Once | INTRAVENOUS | Status: AC | PRN
  Administered 2017-02-27: 100 mL via INTRAVENOUS

## 2017-02-27 MED ORDER — CIPROFLOXACIN HCL 250 MG PO TABS
500.0000 mg | ORAL_TABLET | Freq: Once | ORAL | Status: AC
  Administered 2017-02-27: 500 mg via ORAL
  Filled 2017-02-27: qty 2

## 2017-02-27 MED ORDER — METRONIDAZOLE 500 MG PO TABS: 500.0000 mg | ORAL_TABLET | Freq: Three times a day (TID) | ORAL | 0 refills | Status: DC

## 2017-02-27 MED ORDER — METRONIDAZOLE 500 MG PO TABS
500.0000 mg | ORAL_TABLET | Freq: Once | ORAL | Status: AC
  Administered 2017-02-27: 500 mg via ORAL
  Filled 2017-02-27: qty 1

## 2017-02-27 MED ORDER — CIPROFLOXACIN HCL 500 MG PO TABS: 500.0000 mg | ORAL_TABLET | Freq: Two times a day (BID) | ORAL | 0 refills | Status: DC

## 2017-02-27 NOTE — ED Notes (Signed)
Pt sitting in wheelchair with family.  No distress noted.

## 2017-02-27 NOTE — Telephone Encounter (Signed)
Last seen MMM - dec 2017-please route to nurse for phone in if approved

## 2017-02-27 NOTE — ED Triage Notes (Signed)
Pt reports abd pain with rectal pain since Saturday.  Pt initially reported diarrhea, but then stated she has only had 2 bowel movements since Saturday.  Denies blood in stool .

## 2017-02-27 NOTE — ED Provider Notes (Signed)
[place] DEPT Provider Note   CSN: [id number] Arrival date & time: 02/27/17  1453     History   Chief Complaint Chief Complaint  Patient presents with  . Abdominal Pain  . Rectal Pain    HPI Jacqueline Cervantes is a 82 y.o. female.  HPI  Pt was seen at [place].  Per pt, c/o gradual onset and persistence of constant generalized abd "pain" for the past 1 week, worse over the past 2 days.  Has been associated with multiple intermittent episodes of diarrhea and "rectal pain."  Describes the abd pain as "aching."  Describes her rectal pain as "it might be my hemorrhoid." Denies N/V, no fevers, no back pain, no rash, no CP/SOB, no black or blood in stools or emesis.      Past Medical History:  Diagnosis Date  . Anxiety   . Arthritis    "fingers" (09/03/2015)  . Cancer of left breast ([place])   . COPD with acute exacerbation ([place])    Archie Endo 09/03/2015  . COPD with chronic bronchitis ([place])    Archie Endo 09/03/2015  . Depression   . GERD (gastroesophageal reflux disease)   . Hypercholesteremia   . Hypertension   . On home oxygen therapy    "I use my husband's oxygen 2-3 times/day; 3L" (09/03/2015)  . Skin cancer    "face/nose"  . Type II diabetes mellitus [facility])     Patient Active Problem List   Diagnosis Date Noted  . Chronic respiratory failure with hypoxia ([place]) 05/31/2016  . COPD, severe ([place]) 05/31/2016  . Hemorrhoids 05/03/2016  . Generalized anxiety disorder 07/11/2014  . Tobacco abuse, in remission 08/02/2013  . HTN (hypertension) 05/02/2013  . HLD (hyperlipidemia) 05/02/2013  . DM (diabetes mellitus) ([place]) 05/02/2013  . Osteoporosis 05/02/2013    Past Surgical History:  Procedure Laterality Date  . ABDOMINAL HYSTERECTOMY    . BREAST BIOPSY Left   . CATARACT EXTRACTION, BILATERAL Bilateral   . DILATION AND CURETTAGE OF UTERUS    . MASTECTOMY Left 05/11/2006  . SKIN CANCER EXCISION     "cut it off my nose; went right down the middle, on top"    OB History    Gravida  Para Term Preterm AB Living   '1 1 1     1   '$ SAB TAB Ectopic Multiple Live Births                   Home Medications    Prior to Admission medications   Medication Sig Start Date End Date Taking? Authorizing Provider  acetaminophen (TYLENOL) 500 MG tablet Take 500 mg by mouth every 6 (six) hours as needed for mild pain.   Yes Historical Provider, MD  albuterol (PROVENTIL HFA;VENTOLIN HFA) 108 (90 Base) MCG/ACT inhaler Inhale 2 puffs into the lungs every 6 (six) hours as needed for wheezing or shortness of breath. 11/29/16  Yes Mary-Margaret Hassell Done, FNP  alendronate (FOSAMAX) 70 MG tablet TAKE 1 TABLET BY MOUTH EVERY WEEK WITH A FULL GLASS OF WATER ON AN EMPTY STOMACH ON FRIDAYS 11/28/16  Yes Chipper Herb, MD  ALPRAZolam Duanne Moron) 0.5 MG tablet TAKE 1 TABLET BY MOUTH TWICE A DAY AS NEEDED 02/27/17  Yes Mary-Margaret Hassell Done, FNP  amLODipine (NORVASC) 10 MG tablet Take 1 tablet (10 mg total) by mouth daily. 11/29/16  Yes Mary-Margaret Hassell Done, FNP  atorvastatin (LIPITOR) 40 MG tablet Take 1 tablet (40 mg total) by mouth daily. Patient taking differently: Take 40 mg by mouth every evening.  11/29/16  Yes Mary-Margaret Hassell Done, FNP  budesonide-formoterol (SYMBICORT) 160-4.5 MCG/ACT inhaler Inhale 2 puffs into the lungs 2 (two) times daily. 11/29/16  Yes Mary-Margaret Hassell Done, FNP  citalopram (CELEXA) 20 MG tablet Take 1 tablet (20 mg total) by mouth daily. 11/29/16  Yes Mary-Margaret Hassell Done, FNP  fenofibrate 160 MG tablet Take 1 tablet (160 mg total) by mouth daily. 11/29/16  Yes Mary-Margaret Hassell Done, FNP  fluticasone furoate-vilanterol (BREO ELLIPTA) 100-25 MCG/INH AEPB Inhale 1 puff into the lungs daily. 11/29/16  Yes Mary-Margaret Hassell Done, FNP  hydrochlorothiazide (MICROZIDE) 12.5 MG capsule Take 1 capsule (12.5 mg total) by mouth daily. 11/29/16  Yes Mary-Margaret Hassell Done, FNP  ipratropium-albuterol (DUONEB) 0.5-2.5 (3) MG/3ML SOLN Take 3 mLs by nebulization 4 (four) times daily as needed. 11/01/16  Yes Brand Males, MD  losartan (COZAAR) 25 MG tablet TAKE 1 TABLET (25 MG TOTAL) BY MOUTH DAILY. 11/29/16  Yes Mary-Margaret Hassell Done, FNP  metFORMIN (GLUCOPHAGE) 500 MG tablet TAKE 2 TABLETS IN THE MORNING AND 1 TABLET IN THE EVENING WITH MEALS. 01/13/17  Yes Mary-Margaret Hassell Done, FNP  Omega-3 Fatty Acids (FISH OIL PO) Take 1 capsule by mouth daily.   Yes Historical Provider, MD  polyethylene glycol (MIRALAX / GLYCOLAX) packet Take 17 g by mouth daily.   Yes Historical Provider, MD    Family History Family History  Problem Relation Age of Onset  . Diabetes Mother   . Cancer Father     unknown    Social History Social History  Substance Use Topics  . Smoking status: Former Smoker    Packs/day: 0.50    Years: 52.00    Types: Cigarettes    Quit date: 05/10/2016  . Smokeless tobacco: Never Used     Comment: Pt states she has been smoking intermittently for 52 years.   . Alcohol use No     Allergies   Patient has no known allergies.   Review of Systems Review of Systems ROS: Statement: All systems negative except as marked or noted in the HPI; Constitutional: Negative for fever and chills. ; ; Eyes: Negative for eye pain, redness and discharge. ; ; ENMT: Negative for ear pain, hoarseness, nasal congestion, sinus pressure and sore throat. ; ; Cardiovascular: Negative for chest pain, palpitations, diaphoresis, dyspnea and peripheral edema. ; ; Respiratory: Negative for cough, wheezing and stridor. ; ; Gastrointestinal: +diarrhea, abd pain. Negative for nausea, vomiting, blood in stool, hematemesis, jaundice and rectal bleeding. . ; ; Genitourinary: Negative for dysuria, flank pain and hematuria. ; ; Musculoskeletal: Negative for back pain and neck pain. Negative for swelling and trauma.; ; Skin: Negative for pruritus, rash, abrasions, blisters, bruising and skin lesion.; ; Neuro: Negative for headache, lightheadedness and neck stiffness. Negative for weakness, altered level of consciousness, altered  mental status, extremity weakness, paresthesias, involuntary movement, seizure and syncope.          Physical Exam Updated Vital Signs BP 152/58 (BP Location: Right Arm)   Pulse 86   Temp 98 F (36.7 C) (Oral)   Resp 22   Ht '5\' 5"'$  (1.651 m)   Wt 125 lb (56.7 kg)   SpO2 98%   BMI 20.80 kg/m   Physical Exam 1925: Physical examination:  Nursing notes reviewed; Vital signs and O2 SAT reviewed;  Constitutional: Well developed, Well nourished, Well hydrated, In no acute distress; Head:  Normocephalic, atraumatic; Eyes: EOMI, PERRL, No scleral icterus; ENMT: Mouth and pharynx normal, Mucous membranes moist; Neck: Supple, Full range of motion, No lymphadenopathy; Cardiovascular: Regular rate  and rhythm, No gallop; Respiratory: Breath sounds clear & equal bilaterally, faint scattered wheezes. No audible wheezing. Speaking full sentences with ease, Normal respiratory effort/excursion; Chest: Nontender, Movement normal; Abdomen: +mild diffuse tenderness to palp, Soft, Normal bowel sounds. Rectal exam performed w/permission of pt and ED RN chaperone present.  Anal tone normal.  Non-tender, soft brown stool in rectal vault, heme neg.  No fissures, +external hemorrhoids without thrombosis or bleeding, no palp masses.; Genitourinary: No CVA tenderness; Extremities: Pulses normal, No tenderness, No edema, No calf edema or asymmetry.; Neuro: AA&Ox3, Major CN grossly intact. Speech clear. Moves all extremities on stretcher without apparent gross focal motor deficits.; Skin: Color normal, Warm, Dry.   ED Treatments / Results  Labs (all labs ordered are listed, but only abnormal results are displayed)   EKG  EKG Interpretation None       Radiology   Procedures Procedures (including critical care time)  Medications Ordered in ED Medications  iopamidol (ISOVUE-300) 61 % injection (not administered)     Initial Impression / Assessment and Plan / ED Course  I have reviewed the triage vital  signs and the nursing notes.  Pertinent labs & imaging results that were available during my care of the patient were reviewed by me and considered in my medical decision making (see chart for details).  MDM Reviewed: previous chart, nursing note and vitals Reviewed previous: labs Interpretation: labs, x-ray and CT scan   Results for orders placed or performed during the hospital encounter of 02/27/17  Lipase, blood  Result Value Ref Range   Lipase 22 11 - 51 U/L  Comprehensive metabolic panel  Result Value Ref Range   Sodium 132 (L) 135 - 145 mmol/L   Potassium 4.0 3.5 - 5.1 mmol/L   Chloride 90 (L) 101 - 111 mmol/L   CO2 31 22 - 32 mmol/L   Glucose, Bld 102 (H) 65 - 99 mg/dL   BUN 16 6 - 20 mg/dL   Creatinine, Ser 0.71 0.44 - 1.00 mg/dL   Calcium 10.2 8.9 - 10.3 mg/dL   Total Protein 8.0 6.5 - 8.1 g/dL   Albumin 4.3 3.5 - 5.0 g/dL   AST 18 15 - 41 U/L   ALT 10 (L) 14 - 54 U/L   Alkaline Phosphatase 21 (L) 38 - 126 U/L   Total Bilirubin 0.2 (L) 0.3 - 1.2 mg/dL   GFR calc non Af Amer >60 >60 mL/min   GFR calc Af Amer >60 >60 mL/min   Anion gap 11 5 - 15  CBC  Result Value Ref Range   WBC 9.0 4.0 - 10.5 K/uL   RBC 4.67 3.87 - 5.11 MIL/uL   Hemoglobin 12.3 12.0 - 15.0 g/dL   HCT 37.7 36.0 - 46.0 %   MCV 80.7 78.0 - 100.0 fL   MCH 26.3 26.0 - 34.0 pg   MCHC 32.6 30.0 - 36.0 g/dL   RDW 16.2 (H) 11.5 - 15.5 %   Platelets 442 (H) 150 - 400 K/uL  Urinalysis, Routine w reflex microscopic  Result Value Ref Range   Color, Urine YELLOW YELLOW   APPearance CLEAR CLEAR   Specific Gravity, Urine 1.017 1.005 - 1.030   pH 6.0 5.0 - 8.0   Glucose, UA NEGATIVE NEGATIVE mg/dL   Hgb urine dipstick MODERATE (A) NEGATIVE   Bilirubin Urine NEGATIVE NEGATIVE   Ketones, ur NEGATIVE NEGATIVE mg/dL   Protein, ur 30 (A) NEGATIVE mg/dL   Nitrite POSITIVE (A) NEGATIVE   Leukocytes, UA NEGATIVE  NEGATIVE   RBC / HPF 0-5 0 - 5 RBC/hpf   WBC, UA 0-5 0 - 5 WBC/hpf   Bacteria, UA RARE (A) NONE  SEEN   Mucous PRESENT    Ct Abdomen Pelvis W Contrast Result Date: 02/27/2017 CLINICAL DATA:  Abdomen pain with rectal pain EXAM: CT ABDOMEN AND PELVIS WITH CONTRAST TECHNIQUE: Multidetector CT imaging of the abdomen and pelvis was performed using the standard protocol following bolus administration of intravenous contrast. CONTRAST:  11m ISOVUE-300 IOPAMIDOL (ISOVUE-300) INJECTION 61%, 1062mISOVUE-300 IOPAMIDOL (ISOVUE-300) INJECTION 61% COMPARISON:  02/27/2017 radiograph, CT 04/28/2016, 06/18/2013 FINDINGS: Lower chest: Lung bases demonstrate no acute consolidation or pleural effusion. Hepatobiliary: Mild hepatic steatosis. No focal hepatic abnormality. No calcified gallstones or biliary dilatation. Pancreas: Unremarkable. No pancreatic ductal dilatation or surrounding inflammatory changes. Spleen: Hypodense splenic lesions are unchanged. Small calcified granuloma. Adrenals/Urinary Tract: Adrenal glands within normal limits. Mild scarring right kidney unchanged. No hydronephrosis. Bladder normal Stomach/Bowel: Stomach nonenlarged. No dilated small bowel. Numerous sigmoid colon diverticula. Mild wall thickening involving the rectosigmoid colon. Vascular/Lymphatic: Aortic atherosclerosis. No enlarged abdominal or pelvic lymph nodes. Focal rightward ectasia of the distal aorta above the bifurcation, unchanged. Reproductive: Status post hysterectomy. No adnexal masses. Other: No free air or free fluid. Musculoskeletal: Moderate severe compression of L1, grossly unchanged with minimal 3 mm retropulsion as before. IMPRESSION: 1. Mild wall thickening involving the rectosigmoid colon, could relate to mild colitis/proctitis. Mild diverticulitis may also be considered given the presence of numerous sigmoid colon diverticula. Colonoscopy follow-up if not already performed is suggested to rule out neoplasm as cause for the colon wall thickening. 2. Hepatic steatosis 3. Stable moderate to severe compression of L1.  Electronically Signed   By: KiDonavan Foil.D.   On: 02/27/2017 21:44   Dg Abdomen Acute W/chest Result Date: 02/27/2017 CLINICAL DATA:  Abdominal pain and swelling for the past week associated with diarrhea initially. Long-term smoker. History of COPD, breast malignancy, previous hysterectomy. EXAM: DG ABDOMEN ACUTE W/ 1V CHEST COMPARISON:  Chest x-ray of April 07, 2016 and abdominal and pelvic CT scan of Apr 28, 2016 FINDINGS: The lungs are mildly hyperinflated. The interstitial markings are coarse. There is no alveolar infiltrate or pleural effusion. The heart and pulmonary vascularity are normal. There is calcification in the wall of the aortic arch. The trachea is midline. There is chronic compression of the T8 vertebral body Within the abdomen the bowel gas pattern is within the limits of normal. No abnormal soft tissue calcifications are observed other than in the abdominal aorta. There are no free extraluminal gas collections. There are degenerative changes of the lumbar spine and significant compression of the body of L1. IMPRESSION: COPD.  No acute pneumonia nor CHF. Thoracic aortic atherosclerosis. No acute intra- abdominal abnormality is observed. Electronically Signed   By: David  JoMartinique.D.   On: 02/27/2017 15:52    2200:  No clear UTI on Udip with +nitrites with rare bacteria; UC pending.  CT with mild colitis/proctitis. Normal WBC count and pt afebrile. Offered admission. Pt absolutely refuses admission and wants to go home. Family is in agreement with pt. Pt makes her own medical decisions. Will dose PO abx here in the ED for PO challenge; rx cipro and flagyl. Pt agreeable.   2315:  Pt has tol PO well while in the ED without N/V.  No stooling while in the ED.  Abd benign, VSS. Pt has gotten herself up to a chair at bedside, states she feels better and  wants to go home now. Tx for colitis with abx, f/u PMD. Dx and testing d/w pt and family.  Questions answered.  Verb understanding, agreeable  to d/c home with outpt f/u.      Final Clinical Impressions(s) / ED Diagnoses   Final diagnoses:  None    New Prescriptions New Prescriptions   No medications on file     Francine Graven, DO 03/03/17 1257

## 2017-02-27 NOTE — Telephone Encounter (Signed)
Please call in alprazolam with 1 refills 

## 2017-02-27 NOTE — ED Notes (Signed)
Pt able to tolerate fluid challenge

## 2017-02-27 NOTE — Discharge Instructions (Signed)
Take the prescriptions as directed.  Call your regular medical doctor tomorrow to schedule a follow up appointment within the next 2 days. Call the GI doctor tomorrow to schedule a follow up appointment within the next week.   Return to the Emergency Department immediately sooner if worsening.

## 2017-02-27 NOTE — ED Notes (Signed)
ED Provider at bedside for assessment. Chaperoned for rectal exam. Occult stool negative.

## 2017-02-27 NOTE — ED Notes (Signed)
Patient states that her legs are cramping really bad. Asking for pain medication for it.

## 2017-02-27 NOTE — ED Notes (Signed)
Patient transported to CT 

## 2017-03-01 DIAGNOSIS — J449 Chronic obstructive pulmonary disease, unspecified: Secondary | ICD-10-CM | POA: Diagnosis not present

## 2017-03-03 LAB — URINE CULTURE

## 2017-03-04 ENCOUNTER — Telehealth: Payer: Self-pay

## 2017-03-04 DIAGNOSIS — J441 Chronic obstructive pulmonary disease with (acute) exacerbation: Secondary | ICD-10-CM | POA: Diagnosis not present

## 2017-03-04 NOTE — Progress Notes (Signed)
ED Antimicrobial Stewardship Positive Culture Follow Up   Jacqueline Cervantes is an 82 y.o. female who presented to [facility] on 02/27/2017 with a chief complaint of  Chief Complaint  Patient presents with  . Abdominal Pain  . Rectal Pain    Recent Results (from the past 720 hour(s))  Urine culture     Status: Abnormal   Collection Time: 02/27/17 10:05 PM  Result Value Ref Range Status   Specimen Description URINE, CATHETERIZED  Final   Special Requests NONE  Final   Culture >=100,000 COLONIES/mL ESCHERICHIA COLI (A)  Final   Report Status 03/03/2017 FINAL  Final   Organism ID, Bacteria ESCHERICHIA COLI (A)  Final      Susceptibility   Escherichia coli - MIC*    AMPICILLIN >=32 RESISTANT Resistant     CEFAZOLIN <=4 SENSITIVE Sensitive     CEFTRIAXONE <=1 SENSITIVE Sensitive     CIPROFLOXACIN >=4 RESISTANT Resistant     GENTAMICIN <=1 SENSITIVE Sensitive     IMIPENEM <=0.25 SENSITIVE Sensitive     NITROFURANTOIN <=16 SENSITIVE Sensitive     TRIMETH/SULFA <=20 SENSITIVE Sensitive     AMPICILLIN/SULBACTAM 4 SENSITIVE Sensitive     PIP/TAZO <=4 SENSITIVE Sensitive     * >=100,000 COLONIES/mL ESCHERICHIA COLI    '[x]'$  Treated with cipro and flagyl, organism resistant to prescribed antimicrobial '[]'$  Patient discharged originally without antimicrobial agent and treatment is now indicated  New antibiotic prescription: Keflex '500mg'$  q12h for 5 days  ED Provider: Ocie Cornfield, PA   Andrey Cota. Diona Foley, PharmD, BCPS Clinical Pharmacist [phone] 03/04/2017, 9:14 AM Infectious Diseases Pharmacist

## 2017-03-04 NOTE — Telephone Encounter (Signed)
Post ED Visit - Positive Culture Follow-up: Successful Patient Follow-Up  Culture assessed and recommendations reviewed by: '[]'$  Elenor Quinones, Pharm.D. '[]'$  Heide Guile, Pharm.D., BCPS '[]'$  Parks Neptune, Pharm.D. '[]'$  Alycia Rossetti, Pharm.D., BCPS '[]'$  [place], Pharm.D., BCPS, AAHIVP '[]'$  Legrand Como, Pharm.D., BCPS, AAHIVP '[]'$  Milus Glazier, Pharm.D. '[]'$  Rob [place], [place].D. Sharilyn Sites Pharm D Positive urine culture  '[]'$  Patient discharged without antimicrobial prescription and treatment is now indicated '[x]'$  Organism is resistant to prescribed ED discharge antimicrobial '[]'$  Patient with positive blood cultures  Changes discussed with ED provider: Ocie Cornfield [facility] New antibiotic prescription Keflex 500 mg q12 hrs x 5 days Called to [place] [phone]  Contacted patient, date 03/04/17, time 1008   Akiel Fennell, [place] Comment 03/04/2017, 10:07 AM

## 2017-03-23 ENCOUNTER — Ambulatory Visit (INDEPENDENT_AMBULATORY_CARE_PROVIDER_SITE_OTHER): Payer: Medicare Other

## 2017-03-23 ENCOUNTER — Encounter: Payer: Self-pay | Admitting: Nurse Practitioner

## 2017-03-23 ENCOUNTER — Ambulatory Visit (INDEPENDENT_AMBULATORY_CARE_PROVIDER_SITE_OTHER): Payer: Medicare Other | Admitting: Nurse Practitioner

## 2017-03-23 VITALS — BP 105/60 | HR 108 | Temp 98.0°F | Ht 65.0 in | Wt 125.0 lb

## 2017-03-23 DIAGNOSIS — R1030 Lower abdominal pain, unspecified: Secondary | ICD-10-CM

## 2017-03-23 DIAGNOSIS — K645 Perianal venous thrombosis: Secondary | ICD-10-CM

## 2017-03-23 DIAGNOSIS — K5900 Constipation, unspecified: Secondary | ICD-10-CM | POA: Diagnosis not present

## 2017-03-23 LAB — CBC WITH DIFFERENTIAL/PLATELET
Basophils Absolute: 0.1 10*3/uL (ref 0.0–0.2)
Basos: 1 %
EOS (ABSOLUTE): 0.4 10*3/uL (ref 0.0–0.4)
EOS: 4 %
HEMATOCRIT: 37.2 % (ref 34.0–46.6)
Hemoglobin: 11.7 g/dL (ref 11.1–15.9)
Immature Grans (Abs): 0 10*3/uL (ref 0.0–0.1)
Immature Granulocytes: 0 %
LYMPHS ABS: 3.4 10*3/uL — AB (ref 0.7–3.1)
Lymphs: 38 %
MCH: 26 pg — AB (ref 26.6–33.0)
MCHC: 31.5 g/dL (ref 31.5–35.7)
MCV: 83 fL (ref 79–97)
MONOS ABS: 0.7 10*3/uL (ref 0.1–0.9)
Monocytes: 8 %
Neutrophils Absolute: 4.5 10*3/uL (ref 1.4–7.0)
Neutrophils: 49 %
Platelets: 366 10*3/uL (ref 150–379)
RBC: 4.5 x10E6/uL (ref 3.77–5.28)
RDW: 16.2 % — AB (ref 12.3–15.4)
WBC: 9 10*3/uL (ref 3.4–10.8)

## 2017-03-23 MED ORDER — HYDROCORTISONE ACETATE 25 MG RE SUPP: 25.0000 mg | Freq: Two times a day (BID) | RECTAL | 0 refills | Status: DC

## 2017-03-23 NOTE — Patient Instructions (Signed)
Hemorrhoids Hemorrhoids are swollen veins in and around the rectum or anus. Hemorrhoids can cause pain, itching, or bleeding. Most of the time, they do not cause serious problems. They usually get better with diet changes, lifestyle changes, and other home treatments. Follow these instructions at home: Eating and drinking   Eat foods that have fiber, such as whole grains, beans, nuts, fruits, and vegetables. Ask your doctor about taking products that have added fiber (fibersupplements).  Drink enough fluid to keep your pee (urine) clear or pale yellow. For Pain and Swelling   Take a warm-water bath (sitz bath) for 20 minutes to ease pain. Do this 3-4 times a day.  If directed, put ice on the painful area. It may be helpful to use ice between your warm baths.  Put ice in a plastic bag.  Place a towel between your skin and the bag.  Leave the ice on for 20 minutes, 2-3 times a day. General instructions   Take over-the-counter and prescription medicines only as told by your doctor.  Medicated creams and medicines that are inserted into the anus (suppositories) may be used or applied as told.  Exercise often.  Go to the bathroom when you have the urge to poop (to have a bowel movement). Do not wait.  Avoid pushing too hard (straining) when you poop.  Keep the butt area dry and clean. Use wet toilet paper or moist paper towels.  Do not sit on the toilet for a long time. Contact a doctor if:  You have any of these:  Pain and swelling that do not get better with treatment or medicine.  Bleeding that will not stop.  Trouble pooping or you cannot poop.  Pain or swelling outside the area of the hemorrhoids. This information is not intended to replace advice given to you by your health care provider. Make sure you discuss any questions you have with your health care provider. Document Released: 09/20/2008 Document Revised: 05/19/2016 Document Reviewed: 08/26/2015 Elsevier  Interactive Patient Education  2017 Elsevier Inc.  

## 2017-03-23 NOTE — Progress Notes (Signed)
   Subjective:    Patient ID: Jacqueline Cervantes, female    DOB: [birth year], 82 y.o.   MRN: [id number]  HPI Patient is in today with her daughter. SHe is c/o lower abdominal pain and trouble with hemorrhoids. She went to ER 02/27/17 with the same complaint and was dx with colitis and was given cipro and flagyl. SHe says that the abdominal pain never really changed. SHe says she has to strain to have a bowel movement. Her hemorrhoids mae it difficult for her to go to bathroom. SHe has been using preparation H which has not helped.    Review of Systems  Constitutional: Negative.   Respiratory: Negative.   Cardiovascular: Negative.   Gastrointestinal: Positive for abdominal pain, constipation and rectal pain. Negative for diarrhea, nausea and vomiting.  Genitourinary: Negative.   Neurological: Negative.   Psychiatric/Behavioral: Negative.   All other systems reviewed and are negative.      Objective:   Physical Exam  Constitutional: She is oriented to person, place, and time. She appears well-developed and well-nourished. No distress.  Cardiovascular: Normal rate.   Pulmonary/Chest: Effort normal.  Abdominal: Soft. There is tenderness (suprapubic pain on palpation).  Genitourinary:  Genitourinary Comments: External hemorrhoids- slightly thrombosed  Neurological: She is alert and oriented to person, place, and time.  Skin: Skin is warm.  Psychiatric: She has a normal mood and affect. Her behavior is normal. Judgment and thought content normal.    BP 105/60 (BP Location: Right Arm)   Pulse (!) 108   Temp 98 F (36.7 C) (Oral)   Ht '5\' 5"'$  (1.651 m)   Wt 125 lb (56.7 kg)   BMI 20.80 kg/m   KUB- moderate stool burden-Preliminary reading by Ronnald Collum, FNP  [facility]      Assessment & Plan:  1. Lower abdominal pain - DG Abd 1 View; Future  2. Constipation, unspecified constipation type Mag-citrate 1/2 bottle- if no good results in 6 hours repeat mirallax daily Increase fiberin  diet  3. Perianal venous thrombosis Stool softners daily RTO prn - hydrocortisone (ANUSOL-HC) 25 MG suppository; Place 1 suppository (25 mg total) rectally 2 (two) times daily.  Dispense: 12 suppository; Refill: 0  Mary-Margaret Hassell Done, FNP

## 2017-03-29 DIAGNOSIS — J449 Chronic obstructive pulmonary disease, unspecified: Secondary | ICD-10-CM | POA: Diagnosis not present

## 2017-03-30 ENCOUNTER — Other Ambulatory Visit: Payer: Self-pay | Admitting: Nurse Practitioner

## 2017-03-30 DIAGNOSIS — K645 Perianal venous thrombosis: Secondary | ICD-10-CM

## 2017-04-04 DIAGNOSIS — J441 Chronic obstructive pulmonary disease with (acute) exacerbation: Secondary | ICD-10-CM | POA: Diagnosis not present

## 2017-04-05 ENCOUNTER — Telehealth: Payer: Self-pay | Admitting: Nurse Practitioner

## 2017-04-05 DIAGNOSIS — K649 Unspecified hemorrhoids: Secondary | ICD-10-CM

## 2017-04-05 DIAGNOSIS — R109 Unspecified abdominal pain: Secondary | ICD-10-CM

## 2017-04-05 NOTE — Telephone Encounter (Signed)
What type of referral do you need? GI doctor  Have you been seen at our office for this problem? YES (If no, schedule them an appointment.  They will need to be seen before a referral can be done.)  Is there a particular doctor or location that you prefer? NO  Patient notified that referrals can take up to a week or longer to process. If they haven't heard anything within a week they should call back and speak with the referral department.

## 2017-04-06 ENCOUNTER — Encounter: Payer: Self-pay | Admitting: Internal Medicine

## 2017-04-06 NOTE — Telephone Encounter (Signed)
Needs to see GI- has she seen one before?

## 2017-04-06 NOTE — Telephone Encounter (Signed)
Patients son aware and referral placed.

## 2017-04-06 NOTE — Telephone Encounter (Signed)
Patent states she has never been to GI. Wanting to go for her lower abdominal pain and hemroids that she seen [facility] for on 3/29.

## 2017-04-06 NOTE — Telephone Encounter (Signed)
Ok for referral?

## 2017-04-20 ENCOUNTER — Telehealth: Payer: Self-pay | Admitting: Internal Medicine

## 2017-04-20 DIAGNOSIS — J449 Chronic obstructive pulmonary disease, unspecified: Secondary | ICD-10-CM | POA: Diagnosis not present

## 2017-04-20 MED ORDER — IPRATROPIUM-ALBUTEROL 0.5-2.5 (3) MG/3ML IN SOLN: 3.0000 mL | Freq: Four times a day (QID) | RESPIRATORY_TRACT | 5 refills | Status: DC | PRN

## 2017-04-20 NOTE — Telephone Encounter (Signed)
Pt last seen 09/2016 Rx sent electronically to [facility] Will sign off

## 2017-04-25 ENCOUNTER — Other Ambulatory Visit: Payer: Self-pay | Admitting: Nurse Practitioner

## 2017-05-04 DIAGNOSIS — J441 Chronic obstructive pulmonary disease with (acute) exacerbation: Secondary | ICD-10-CM | POA: Diagnosis not present

## 2017-05-08 ENCOUNTER — Other Ambulatory Visit: Payer: Self-pay | Admitting: Nurse Practitioner

## 2017-05-09 NOTE — Telephone Encounter (Signed)
Rx called to pharmacy

## 2017-05-09 NOTE — Telephone Encounter (Signed)
Please call in xanax with 1 refills 

## 2017-05-12 ENCOUNTER — Other Ambulatory Visit: Payer: Self-pay | Admitting: Family Medicine

## 2017-05-15 ENCOUNTER — Ambulatory Visit (INDEPENDENT_AMBULATORY_CARE_PROVIDER_SITE_OTHER): Payer: Medicare Other | Admitting: Internal Medicine

## 2017-05-15 ENCOUNTER — Encounter (INDEPENDENT_AMBULATORY_CARE_PROVIDER_SITE_OTHER): Payer: Self-pay

## 2017-05-15 ENCOUNTER — Encounter: Payer: Self-pay | Admitting: Internal Medicine

## 2017-05-15 VITALS — BP 124/66 | HR 86 | Ht 64.0 in | Wt 120.6 lb

## 2017-05-15 DIAGNOSIS — K601 Chronic anal fissure: Secondary | ICD-10-CM | POA: Diagnosis not present

## 2017-05-15 DIAGNOSIS — J449 Chronic obstructive pulmonary disease, unspecified: Secondary | ICD-10-CM | POA: Diagnosis not present

## 2017-05-15 MED ORDER — AMBULATORY NON FORMULARY MEDICATION: 3 refills | Status: DC

## 2017-05-15 NOTE — Patient Instructions (Signed)
We have sent the following medications to [facility] in [place] for you to pick up at your convenience: Rectal cream   Use a tablespoon of benefiber daily, handout provided.   Follow up with Dr Carlean Purl in 2 months.    I appreciate the opportunity to care for you. Silvano Rusk, MD, [facility]

## 2017-05-15 NOTE — Progress Notes (Signed)
   Jacqueline Cervantes 82 y.o. February 10, 1935 [id number]  Assessment & Plan:   Encounter Diagnosis  Name Primary?  . Chronic posterior anal fissure Yes   Treat with diltiazem and lidocaine cream. Benefiber daily. Follow-up in 2 months.    Subjective:   Chief Complaint:  HPI The patient is here with her daughter. She has a several month or more history of rectal pain. She eventually ended up in the ER where there was thickening of the rectum. I think there's been some bleeding. She has some protrusion or prolapse symptoms of hemorrhoids she thinks. In 2014 she had similar findings her symptoms she actually had thickening of the rectal wall there and had a flexible sigmoidoscopy by Dr. Paulita Fujita that was negative. Those symptoms abated on their own. She has very painful symptoms with defecation, it's worse with sitting about the only time she gets relief is lying down. No fevers. She doesn't remember having significant straining to stool. Her daughter says that her bowel movements are small volume and somewhat junk year in pieces. Sitting in a warm bath or using a hot washed cloth in the anal or rectal area does provide some benefit. She has not been able to retain any suppositories etc. for treatment. Medications, allergies, past medical history, past surgical history, family history and social history are reviewed and updated in the EMR.   Review of Systems Urge urinary incontinence as noted  Objective:   Physical Exam '@BP'$  124/66   Pulse 86   Ht '5\' 4"'$  (1.626 m)   Wt 120 lb 9.6 oz (54.7 kg)   BMI 20.70 kg/m @  General:  Well-developed, well-nourished and in no acute distress Eyes:  anicteric. ENT:   Mouth and posterior pharynx free of lesions.  Neck:   supple w/o thyromegaly or mass.  Lungs: Clear to auscultation bilaterally. Heart:  S1S2, no rubs, murmurs, gallops. Abdomen:  soft, non-tender, no hepatosplenomegaly, hernia, or mass and BS+.  Rectal:  Sanda Linger RN  present  Anoderm looks normal there is no fluctuance or tenderness around the anus Digital rectal exam with fifth digit is tolerated very well, index finger is used and there is significant tenderness and pain posteriorly. There is increased resting tone and spasm. No mass in the rectum.  Anoscopy is performed and confirms posterior anal fissure. I did not insert deeply to look for hemorrhoids due to pain.  Lymph:  no cervical or supraclavicular adenopathy. Extremities:   no edema, cyanosis or clubbing Skin   no rash. Neuro:  A&O x 3.  Psych:  appropriate mood and  Affect.   Data Reviewed: CT ED notes PCP notes 2014 flex sig

## 2017-06-01 ENCOUNTER — Ambulatory Visit: Payer: Medicare Other | Admitting: Nurse Practitioner

## 2017-06-03 ENCOUNTER — Other Ambulatory Visit: Payer: Self-pay | Admitting: Nurse Practitioner

## 2017-06-03 DIAGNOSIS — I10 Essential (primary) hypertension: Secondary | ICD-10-CM

## 2017-06-04 DIAGNOSIS — J441 Chronic obstructive pulmonary disease with (acute) exacerbation: Secondary | ICD-10-CM | POA: Diagnosis not present

## 2017-06-12 DIAGNOSIS — J449 Chronic obstructive pulmonary disease, unspecified: Secondary | ICD-10-CM | POA: Diagnosis not present

## 2017-06-20 ENCOUNTER — Ambulatory Visit (INDEPENDENT_AMBULATORY_CARE_PROVIDER_SITE_OTHER): Payer: Medicare Other | Admitting: Nurse Practitioner

## 2017-06-20 ENCOUNTER — Encounter: Payer: Self-pay | Admitting: Nurse Practitioner

## 2017-06-20 VITALS — BP 128/67 | HR 98 | Temp 97.1°F | Ht 64.0 in | Wt 118.0 lb

## 2017-06-20 DIAGNOSIS — F411 Generalized anxiety disorder: Secondary | ICD-10-CM | POA: Diagnosis not present

## 2017-06-20 DIAGNOSIS — M810 Age-related osteoporosis without current pathological fracture: Secondary | ICD-10-CM | POA: Diagnosis not present

## 2017-06-20 DIAGNOSIS — J449 Chronic obstructive pulmonary disease, unspecified: Secondary | ICD-10-CM | POA: Diagnosis not present

## 2017-06-20 DIAGNOSIS — E785 Hyperlipidemia, unspecified: Secondary | ICD-10-CM

## 2017-06-20 DIAGNOSIS — I10 Essential (primary) hypertension: Secondary | ICD-10-CM

## 2017-06-20 DIAGNOSIS — E119 Type 2 diabetes mellitus without complications: Secondary | ICD-10-CM

## 2017-06-20 LAB — LIPID PANEL
Chol/HDL Ratio: 2.3 ratio (ref 0.0–4.4)
Cholesterol, Total: 116 mg/dL (ref 100–199)
HDL: 51 mg/dL (ref >39)
LDL CALC: 34 mg/dL (ref 0–99)
Triglycerides: 153 mg/dL — ABNORMAL HIGH (ref 0–149)
VLDL CHOLESTEROL CAL: 31 mg/dL (ref 5–40)

## 2017-06-20 LAB — CMP14+EGFR
ALBUMIN: 4.5 g/dL (ref 3.5–4.8)
ALT: 9 IU/L (ref 0–32)
AST: 14 IU/L (ref 0–40)
Albumin/Globulin Ratio: 1.6 (ref 1.2–2.2)
Alkaline Phosphatase: 21 IU/L — ABNORMAL LOW (ref 39–117)
BILIRUBIN TOTAL: 0.2 mg/dL (ref 0.0–1.2)
BUN / CREAT RATIO: 22 (ref 12–28)
BUN: 15 mg/dL (ref 8–27)
CO2: 28 mmol/L (ref 20–29)
CREATININE: 0.69 mg/dL (ref 0.57–1.00)
Calcium: 9.9 mg/dL (ref 8.7–10.3)
Chloride: 91 mmol/L — ABNORMAL LOW (ref 96–106)
GFR calc non Af Amer: 83 mL/min/{1.73_m2} (ref >59)
GFR, EST AFRICAN AMERICAN: 96 mL/min/{1.73_m2} (ref >59)
GLOBULIN, TOTAL: 2.8 g/dL (ref 1.5–4.5)
GLUCOSE: 97 mg/dL (ref 65–99)
Potassium: 4.5 mmol/L (ref 3.5–5.2)
SODIUM: 137 mmol/L (ref 134–144)
TOTAL PROTEIN: 7.3 g/dL (ref 6.0–8.5)

## 2017-06-20 LAB — BAYER DCA HB A1C WAIVED: HB A1C (BAYER DCA - WAIVED): 5.6 % (ref <7.0)

## 2017-06-20 NOTE — Progress Notes (Signed)
Subjective:    Patient ID: Jacqueline Cervantes, female    DOB: 11/30/1935, 82 y.o.   MRN: [id number]  HPI Jacqueline Cervantes is here today for follow up of chronic medical problem.  Outpatient Encounter Prescriptions as of 06/20/2017  Medication Sig  . acetaminophen (TYLENOL) 500 MG tablet Take 500 mg by mouth every 6 (six) hours as needed for mild pain.  Marland Kitchen albuterol (PROVENTIL HFA;VENTOLIN HFA) 108 (90 Base) MCG/ACT inhaler Inhale 2 puffs into the lungs every 6 (six) hours as needed for wheezing or shortness of breath.  Marland Kitchen alendronate (FOSAMAX) 70 MG tablet TAKE 1 TABLET BY MOUTH EVERY WEEK WITH A FULL GLASS OF WATER ON AN EMPTY STOMACH ON FRIDAYS  . ALPRAZolam (XANAX) 0.5 MG tablet TAKE 1 TABLET BY MOUTH TWICE A DAY AS NEEDED  . AMBULATORY NON FORMULARY MEDICATION 2% Diltiazem gel mixed with 5% Lidocaine, apply to rectum three times a day  . amLODipine (NORVASC) 10 MG tablet Take 1 tablet (10 mg total) by mouth daily.  Marland Kitchen atorvastatin (LIPITOR) 40 MG tablet Take 1 tablet (40 mg total) by mouth daily. (Patient taking differently: Take 40 mg by mouth every evening. )  . budesonide-formoterol (SYMBICORT) 160-4.5 MCG/ACT inhaler Inhale 2 puffs into the lungs 2 (two) times daily.  . citalopram (CELEXA) 20 MG tablet Take 1 tablet (20 mg total) by mouth daily.  . fenofibrate 160 MG tablet Take 1 tablet (160 mg total) by mouth daily.  . fluticasone furoate-vilanterol (BREO ELLIPTA) 100-25 MCG/INH AEPB Inhale 1 puff into the lungs daily.  . hydrochlorothiazide (MICROZIDE) 12.5 MG capsule Take 1 capsule (12.5 mg total) by mouth daily.  . hydrocortisone (ANUSOL-HC) 25 MG suppository PLACE 1 SUPPOSITORY (25 MG TOTAL) RECTALLY 2 (TWO) TIMES DAILY.  Marland Kitchen ipratropium-albuterol (DUONEB) 0.5-2.5 (3) MG/3ML SOLN Take 3 mLs by nebulization 4 (four) times daily as needed. Dx: J44.9  . losartan (COZAAR) 25 MG tablet TAKE 1 TABLET (25 MG TOTAL) BY MOUTH DAILY.  . metFORMIN (GLUCOPHAGE) 500 MG tablet TAKE 2 TABLETS IN THE  MORNING AND 1 TABLET IN THE EVENING WITH MEALS.  Marland Kitchen Omega-3 Fatty Acids (FISH OIL PO) Take 1 capsule by mouth daily.  . OXYGEN Inhale 2 L/min into the lungs.  . polyethylene glycol (MIRALAX / GLYCOLAX) packet Take 17 g by mouth daily.   No facility-administered encounter medications on file as of 06/20/2017.     1. Essential hypertension  Patient blood pressure managed with amlodipine, HCTZ, losartan.  Patient checks blood pressure weekly and it is in normal range at home.  2. Type 2 diabetes mellitus without complication, without long-term current use of insulin ([place])  Patient takes metformin.  Patient checks blood glucose every other day.  Patient states it runs 120.  3. Hyperlipidemia, unspecified hyperlipidemia type  Managed with atorvastatin and fenofibrate and low fat diet.  4. COPD, severe ([place])  Symptoms managed with Symbicort and Breo Ellipta, duoneb as needed.  Patient wears oxygen at home (2L).  5. Age-related osteoporosis without current pathological fracture  Patient manages with fosamax.  6. Generalized anxiety disorder  Symptoms managed with citalopram daily and alprazolam as needed.    New complaints: None today.    Review of Systems  Constitutional: Negative for fatigue and fever.  Respiratory: Positive for wheezing. Negative for cough and shortness of breath.   Cardiovascular: Negative for chest pain and palpitations.  Gastrointestinal: Negative for abdominal distention and abdominal pain.  Neurological: Negative for headaches.  All other systems reviewed and are  negative.      Objective:   Physical Exam  Constitutional: She is oriented to person, place, and time. She appears well-developed and well-nourished.  HENT:  Head: Normocephalic.  Right Ear: External ear normal.  Left Ear: External ear normal.  Mouth/Throat: Oropharynx is clear and moist.  Eyes: Pupils are equal, round, and reactive to light.  Neck: Normal range of motion. Neck supple. No JVD  present. No thyromegaly present.  Cardiovascular: Normal rate, regular rhythm, normal heart sounds and intact distal pulses.   No murmur heard. Pulmonary/Chest: Effort normal. No respiratory distress. She has wheezes (inspiratory and expiratory wheezing in all lung fields).  Abdominal: Soft. Bowel sounds are normal. She exhibits no distension. There is no tenderness.  Musculoskeletal: Normal range of motion.  Lymphadenopathy:    She has no cervical adenopathy.  Neurological: She is alert and oriented to person, place, and time.  Skin: Skin is warm and dry.  Psychiatric: She has a normal mood and affect. Her behavior is normal. Judgment and thought content normal.   BP 128/67   Pulse 98   Temp 97.1 F (36.2 C) (Oral)   Ht _0  (1.626 m)   Wt 118 lb (53.5 kg)   SpO2 94% Comment: On 2Liters of O2  BMI 20.25 kg/m   A1C: 5.6%    Assessment & Plan:  1. Essential hypertension Low sodium diet - CMP14+EGFR  2. Type 2 diabetes mellitus without complication, without long-term current use of insulin (HCC) Continue to watch carbs in diet - Bayer DCA Hb A1c Waived  3. Hyperlipidemia, unspecified hyperlipidemia type Low fat diet - Lipid panel  4. COPD, severe ([place])  5. Age-related osteoporosis without current pathological fracture Weight bearing exercise as can tolerate  6. Generalized anxiety disorder Stress management    Labs pending Health maintenance reviewed Diet and exercise encouraged Continue all meds Follow up  In 3 months   [place], FNP

## 2017-06-21 ENCOUNTER — Ambulatory Visit (INDEPENDENT_AMBULATORY_CARE_PROVIDER_SITE_OTHER): Payer: Medicare Other | Admitting: *Deleted

## 2017-06-21 VITALS — BP 130/73 | HR 87 | Ht 63.25 in | Wt 117.0 lb

## 2017-06-21 DIAGNOSIS — Z0000 Encounter for general adult medical examination without abnormal findings: Secondary | ICD-10-CM | POA: Diagnosis not present

## 2017-06-21 DIAGNOSIS — Z780 Asymptomatic menopausal state: Secondary | ICD-10-CM

## 2017-06-21 DIAGNOSIS — J449 Chronic obstructive pulmonary disease, unspecified: Secondary | ICD-10-CM

## 2017-06-21 NOTE — Patient Instructions (Signed)
  Jacqueline Cervantes , Thank you for taking time to come for your Medicare Wellness Visit. I appreciate your ongoing commitment to your health goals. Please review the following plan we discussed and let me know if I can assist you in the future.   These are the goals we discussed: Goals    . Exercise 150 minutes per week (moderate activity)          Chair exercises daily and try to walk for 10 minutes a day as tolerated        This is a list of the screening recommended for you and due dates:  Health Maintenance  Topic Date Due  . Eye exam for diabetics  06/28/2015  . Pneumonia vaccines (2 of 2 - PPSV23) 07/21/2017*  . Flu Shot  07/26/2017  . Complete foot exam   11/29/2017  . Hemoglobin A1C  12/20/2017  . Tetanus Vaccine  11/02/2023  . DEXA scan (bone density measurement)  Completed  *Topic was postponed. The date shown is not the original due date.

## 2017-06-22 NOTE — Addendum Note (Signed)
Addended by: Ilean China on: 06/22/2017 09:50 AM   Modules accepted: Orders

## 2017-06-22 NOTE — Addendum Note (Signed)
Addended by: Ilean China on: 06/22/2017 09:01 AM   Modules accepted: Level of Service

## 2017-06-22 NOTE — Progress Notes (Addendum)
Subjective:   Jacqueline Cervantes is a 82 y.o. female who presents for an Initial Medicare Annual Wellness Visit. Jacqueline Cervantes is accompanied by her daughter, Jacqueline Cervantes. Jacqueline Cervantes lives at home with her husband. Another daughter and and her daughter's boyfriend live there as well. She has 1 biological daughter, 2 step daughters, and 1 adopted daughter. They do not have any pets. She enjoys watching game shows on television. Jacqueline Cervantes does not participate in any exercise currently. 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   Review of Systems    Reports that her health is worse than last year but did not have any specific complaints.   Cardiac Risk Factors include: advanced age (>33men, >24 women);diabetes mellitus;dyslipidemia;hypertension;smoking/ tobacco exposure;sedentary lifestyle  Other systems negative.     Objective:    BP 130/73 (BP Location: Left Arm, Patient Position: Sitting, Cuff Size: Small)   Pulse 87   Ht 5' 3.25" (1.607 m)   Wt 117 lb (53.1 kg)   SpO2 (!) 84% Comment: with exercise on room air  BMI 20.56 kg/m   SpO2 at rest on room air 92% SpO2 with exercise on room air 84% SpO2 on 2 l/m O2 with exercise 87%   Body mass index is 20.56 kg/m.   Current Medications (verified) Outpatient Encounter Prescriptions as of 06/21/2017  Medication Sig  . acetaminophen (TYLENOL) 500 MG tablet Take 500 mg by mouth every 6 (six) hours as needed for mild pain.  Marland Kitchen albuterol (PROVENTIL HFA;VENTOLIN HFA) 108 (90 Base) MCG/ACT inhaler Inhale 2 puffs into the lungs every 6 (six) hours as needed for wheezing or shortness of breath.  Marland Kitchen alendronate (FOSAMAX) 70 MG tablet TAKE 1 TABLET BY MOUTH EVERY WEEK WITH A FULL GLASS OF WATER ON AN EMPTY STOMACH ON FRIDAYS  . ALPRAZolam (XANAX) 0.5 MG tablet TAKE 1 TABLET BY MOUTH TWICE A DAY AS NEEDED  . AMBULATORY NON FORMULARY MEDICATION 2% Diltiazem gel mixed with 5% Lidocaine, apply to rectum three times a day  . amLODipine (NORVASC) 10 MG  tablet Take 1 tablet (10 mg total) by mouth daily.  Marland Kitchen atorvastatin (LIPITOR) 40 MG tablet Take 1 tablet (40 mg total) by mouth daily. (Patient taking differently: Take 40 mg by mouth every evening. )  . budesonide-formoterol (SYMBICORT) 160-4.5 MCG/ACT inhaler Inhale 2 puffs into the lungs 2 (two) times daily.  . citalopram (CELEXA) 20 MG tablet Take 1 tablet (20 mg total) by mouth daily.  . fenofibrate 160 MG tablet Take 1 tablet (160 mg total) by mouth daily.  . fluticasone furoate-vilanterol (BREO ELLIPTA) 100-25 MCG/INH AEPB Inhale 1 puff into the lungs daily.  . hydrochlorothiazide (MICROZIDE) 12.5 MG capsule Take 1 capsule (12.5 mg total) by mouth daily.  . hydrocortisone (ANUSOL-HC) 25 MG suppository PLACE 1 SUPPOSITORY (25 MG TOTAL) RECTALLY 2 (TWO) TIMES DAILY.  Marland Kitchen ipratropium-albuterol (DUONEB) 0.5-2.5 (3) MG/3ML SOLN Take 3 mLs by nebulization 4 (four) times daily as needed. Dx: J44.9  . losartan (COZAAR) 25 MG tablet TAKE 1 TABLET (25 MG TOTAL) BY MOUTH DAILY.  . metFORMIN (GLUCOPHAGE) 500 MG tablet TAKE 2 TABLETS IN THE MORNING AND 1 TABLET IN THE EVENING WITH MEALS.  Marland Kitchen Omega-3 Fatty Acids (FISH OIL PO) Take 1 capsule by mouth daily.  . OXYGEN Inhale 2 L/min into the lungs.  . polyethylene glycol (MIRALAX / GLYCOLAX) packet Take 17 g by mouth daily.   No facility-administered encounter medications on file as of 06/21/2017.     Allergies (verified)  Patient has no known allergies.   History: Past Medical History:  Diagnosis Date  . Anxiety   . Arthritis    "fingers" (09/03/2015)  . Cancer of left breast ([place])   . COPD with acute exacerbation ([place])    Jacqueline Cervantes 09/03/2015  . COPD with chronic bronchitis ([place])    Jacqueline Cervantes 09/03/2015  . Depression   . GERD (gastroesophageal reflux disease)   . Hypercholesteremia   . Hypertension   . On home oxygen therapy    "I use my husband's oxygen 2-3 times/day; 3L" (09/03/2015)  . Skin cancer    "face/nose"  . Type II diabetes mellitus ([place])     Past Surgical History:  Procedure Laterality Date  . ABDOMINAL HYSTERECTOMY    . BREAST BIOPSY Left   . CATARACT EXTRACTION, BILATERAL Bilateral   . DILATION AND CURETTAGE OF UTERUS    . FLEXIBLE SIGMOIDOSCOPY  2014   [facility]  . MASTECTOMY Left 05/11/2006  . SKIN CANCER EXCISION     "cut it off my nose; went right down the middle, on top"   Family History  Problem Relation Age of Onset  . Diabetes Mother   . Cancer Father        unknown  . Cancer Sister   . Diabetes Sister   . Cancer Brother   . Diabetes Brother   . Diabetes Brother   . Colon cancer Neg Hx   . Stomach cancer Neg Hx   . Esophageal cancer Neg Hx    Social History   Occupational History  . retired    Social History Main Topics  . Smoking status: Current Every Day Smoker    Packs/day: 0.25    Years: 52.00    Types: Cigarettes    Last attempt to quit: 05/10/2016  . Smokeless tobacco: Never Used     Comment: Pt states she has been smoking intermittently for 52 years.   . Alcohol use No  . Drug use: No  . Sexual activity: No    Tobacco Counseling Ready to quit: Yes Counseling given: Yes   Activities of Daily Living In your present state of health, do you have any difficulty performing the following activities: 06/21/2017  Hearing? N  Vision? N  Difficulty concentrating or making decisions? N  Walking or climbing stairs? N  Dressing or bathing? N  Doing errands, shopping? N  Preparing Food and eating ? Y  Using the Toilet? N  In the past six months, have you accidently leaked urine? N  Do you have problems with loss of bowel control? N  Managing your Medications? N  Managing your Finances? N  Housekeeping or managing your Housekeeping? Y  Some recent data might be hidden    Immunizations and Health Maintenance Immunization History  Administered Date(s) Administered  . Influenza,inj,Quad PF,36+ Mos 11/01/2013, 10/16/2014, 09/04/2015, 10/21/2016  . Pneumococcal Conjugate-13  05/15/2015   Health Maintenance Due  Topic Date Due  . OPHTHALMOLOGY EXAM  06/28/2015    Patient Care Team: [name], FNP as PCP - General (Nurse Practitioner) Gatha Mayer, MD as Consulting Physician (Gastroenterology) Brand Males, MD as Consulting Physician (Pulmonary Disease)  ER visit on 02/27/17 for colitis. No hospitalizations or surgeries this past year.     Assessment:   This is a routine wellness examination for Jacqueline Cervantes.  Hearing/Vision screen No hearing or vision deficits noted during visit. Eye exam last month at [facility]. Report to be requested.   Dietary issues and exercise  activities discussed: Current Exercise Habits: The patient does not participate in regular exercise at present, Exercise limited by: respiratory conditions(s)   Diet: Eats 2 meals a day. There is a lack of food in the home and a lack of help with preparing meals. They do not qualify for Meals on Wheels but haven't checked in to food stamps. Her daughter, Jacqueline Cervantes, buys them frozen meals that are easy to prepare but these are high in sodium.   Goals    . Exercise 150 minutes per week (moderate activity)          Chair exercises daily and try to walk for 10 minutes a day as tolerated       Depression Screen PHQ 2/9 Scores 06/21/2017 06/20/2017 03/23/2017 11/29/2016 10/21/2016 08/23/2016 05/12/2016  PHQ - 2 Score 1 0 0 0 0 0 0  PHQ- 9 Score - - - - - - -    Fall Risk Fall Risk  06/21/2017 06/20/2017 03/23/2017 11/29/2016 10/21/2016  Falls in the past year? No No No No No  Number falls in past yr: - - - - -    Cognitive Function: MMSE - Mini Mental State Exam 06/21/2017  Orientation to time 4  Orientation to Place 4  Registration 3  Attention/ Calculation 0  Attention/Calculation-comments didn't attempt. Stated she didn't have much education  Recall 3  Language- name 2 objects 2  Language- repeat 0  Language- follow 3 step command 3  Language- read & follow  direction 0  Write a sentence 0  Write a sentence-comments Didn't attempt  Copy design 0  Total score 19  Patient unable or unwilling to attempt all questions. What was assessed was normal.       Screening Tests Health Maintenance  Topic Date Due  . OPHTHALMOLOGY EXAM  06/28/2015  . PNA vac Low Risk Adult (2 of 2 - PPSV23) 07/21/2017 (Originally 05/14/2016)  . INFLUENZA VACCINE  07/26/2017  . FOOT EXAM  11/29/2017  . HEMOGLOBIN A1C  12/20/2017  . TETANUS/TDAP  11/02/2023  . DEXA SCAN  Completed  Declined pneumonia vaccine Eye exam 04/2017. Will request report.    Plan:  -Follow up with [name], FNP in Sept 2018. -Dexa scan due at that visit. -Requested portable oxygen. Prescription written and will send it to [place] per patient/daughter request. -Food pantry location handout given and discussed. Discussed with patient and daughter that they should contact Social Services to see if they qualify for food stamps. Her daughter is familiar with this program. They have reached out to Meals on Wheels but didn't qualify for assistance.  -Asked them to talk with the daughter and boyfriend that live there and ask for more assistance with food purchase and preparation -Chair exercise handout given and explained.  -Review Advance Directives and bring a signed/notarized copy to our office.   I have personally reviewed and noted the following in the patient's chart:   . Medical and social history . Use of alcohol, tobacco or illicit drugs  . Current medications and supplements . Functional ability and status . Nutritional status . Physical activity . Advanced directives . List of other physicians . Hospitalizations, surgeries, and ER visits in previous 12 months . Vitals . Screenings to include cognitive, depression, and falls . Referrals and appointments  In addition, I have reviewed and discussed with patient certain preventive protocols, quality metrics, and  best practice recommendations. A written personalized care plan for preventive services as well as general preventive  health recommendations were provided to patient.     Chong Sicilian, RN  06/22/2017    I have reviewed and agree with the above AWV documentation.   [name], MD [facility] 06/22/2017, 2:09 PM

## 2017-06-23 ENCOUNTER — Other Ambulatory Visit: Payer: Self-pay | Admitting: Nurse Practitioner

## 2017-06-23 DIAGNOSIS — I10 Essential (primary) hypertension: Secondary | ICD-10-CM

## 2017-06-23 DIAGNOSIS — F411 Generalized anxiety disorder: Secondary | ICD-10-CM

## 2017-06-27 ENCOUNTER — Other Ambulatory Visit: Payer: Self-pay | Admitting: *Deleted

## 2017-06-27 DIAGNOSIS — J449 Chronic obstructive pulmonary disease, unspecified: Secondary | ICD-10-CM

## 2017-07-04 DIAGNOSIS — J441 Chronic obstructive pulmonary disease with (acute) exacerbation: Secondary | ICD-10-CM | POA: Diagnosis not present

## 2017-07-06 ENCOUNTER — Other Ambulatory Visit: Payer: Self-pay | Admitting: Nurse Practitioner

## 2017-07-06 DIAGNOSIS — J449 Chronic obstructive pulmonary disease, unspecified: Secondary | ICD-10-CM | POA: Diagnosis not present

## 2017-07-07 NOTE — Telephone Encounter (Signed)
Please call in alprazolam with 1 refills 

## 2017-07-07 NOTE — Telephone Encounter (Signed)
Refill w/ 1 add't called to [facility]

## 2017-07-07 NOTE — Telephone Encounter (Signed)
Last seen 06/20/17  MMM  If approved route to nurse to call into [facility]

## 2017-07-09 ENCOUNTER — Encounter (HOSPITAL_COMMUNITY): Payer: Self-pay | Admitting: Emergency Medicine

## 2017-07-09 ENCOUNTER — Emergency Department (HOSPITAL_COMMUNITY)
Admission: EM | Admit: 2017-07-09 | Discharge: 2017-07-09 | Disposition: A | Discharge status: Home / Self Care | Payer: Medicare Other | Attending: Emergency Medicine | Admitting: Emergency Medicine

## 2017-07-09 ENCOUNTER — Emergency Department (HOSPITAL_COMMUNITY): Payer: Medicare Other

## 2017-07-09 DIAGNOSIS — F17210 Nicotine dependence, cigarettes, uncomplicated: Secondary | ICD-10-CM | POA: Insufficient documentation

## 2017-07-09 DIAGNOSIS — I10 Essential (primary) hypertension: Secondary | ICD-10-CM | POA: Diagnosis not present

## 2017-07-09 DIAGNOSIS — E119 Type 2 diabetes mellitus without complications: Secondary | ICD-10-CM | POA: Diagnosis not present

## 2017-07-09 DIAGNOSIS — Z79899 Other long term (current) drug therapy: Secondary | ICD-10-CM | POA: Diagnosis not present

## 2017-07-09 DIAGNOSIS — R079 Chest pain, unspecified: Secondary | ICD-10-CM | POA: Diagnosis not present

## 2017-07-09 DIAGNOSIS — R112 Nausea with vomiting, unspecified: Secondary | ICD-10-CM | POA: Insufficient documentation

## 2017-07-09 DIAGNOSIS — J449 Chronic obstructive pulmonary disease, unspecified: Secondary | ICD-10-CM | POA: Insufficient documentation

## 2017-07-09 DIAGNOSIS — Z7984 Long term (current) use of oral hypoglycemic drugs: Secondary | ICD-10-CM | POA: Insufficient documentation

## 2017-07-09 LAB — COMPREHENSIVE METABOLIC PANEL
ALT: 12 U/L — ABNORMAL LOW (ref 14–54)
ANION GAP: 17 — AB (ref 5–15)
AST: 15 U/L (ref 15–41)
Albumin: 3.9 g/dL (ref 3.5–5.0)
Alkaline Phosphatase: 37 U/L — ABNORMAL LOW (ref 38–126)
BUN: 13 mg/dL (ref 6–20)
CHLORIDE: 86 mmol/L — AB (ref 101–111)
CO2: 32 mmol/L (ref 22–32)
Calcium: 10.8 mg/dL — ABNORMAL HIGH (ref 8.9–10.3)
Creatinine, Ser: 0.67 mg/dL (ref 0.44–1.00)
GFR calc Af Amer: >60 mL/min (ref >60)
Glucose, Bld: 121 mg/dL — ABNORMAL HIGH (ref 65–99)
POTASSIUM: 3.8 mmol/L (ref 3.5–5.1)
Sodium: 135 mmol/L (ref 135–145)
Total Bilirubin: 0.5 mg/dL (ref 0.3–1.2)
Total Protein: 8.9 g/dL — ABNORMAL HIGH (ref 6.5–8.1)

## 2017-07-09 LAB — CBC WITH DIFFERENTIAL/PLATELET
BASOS ABS: 0 10*3/uL (ref 0.0–0.1)
Basophils Relative: 0 %
EOS PCT: 0 %
Eosinophils Absolute: 0.1 10*3/uL (ref 0.0–0.7)
HCT: 33.5 % — ABNORMAL LOW (ref 36.0–46.0)
Hemoglobin: 11.2 g/dL — ABNORMAL LOW (ref 12.0–15.0)
LYMPHS PCT: 12 %
Lymphs Abs: 1.9 10*3/uL (ref 0.7–4.0)
MCH: 26.4 pg (ref 26.0–34.0)
MCHC: 33.4 g/dL (ref 30.0–36.0)
MCV: 79 fL (ref 78.0–100.0)
MONO ABS: 1.2 10*3/uL — AB (ref 0.1–1.0)
Monocytes Relative: 7 %
Neutro Abs: 12.9 10*3/uL — ABNORMAL HIGH (ref 1.7–7.7)
Neutrophils Relative %: 81 %
PLATELETS: 508 10*3/uL — AB (ref 150–400)
RBC: 4.24 MIL/uL (ref 3.87–5.11)
RDW: 16.3 % — AB (ref 11.5–15.5)
WBC: 16.1 10*3/uL — ABNORMAL HIGH (ref 4.0–10.5)

## 2017-07-09 LAB — URINALYSIS, ROUTINE W REFLEX MICROSCOPIC
BILIRUBIN URINE: NEGATIVE
Glucose, UA: NEGATIVE mg/dL
Ketones, ur: NEGATIVE mg/dL
LEUKOCYTES UA: NEGATIVE
NITRITE: NEGATIVE
PH: 7 (ref 5.0–8.0)
Protein, ur: 100 mg/dL — AB
SPECIFIC GRAVITY, URINE: 1.012 (ref 1.005–1.030)
Squamous Epithelial / LPF: NONE SEEN

## 2017-07-09 LAB — TROPONIN I

## 2017-07-09 LAB — LIPASE, BLOOD: LIPASE: 25 U/L (ref 11–51)

## 2017-07-09 MED ORDER — ONDANSETRON 4 MG PO TBDP: 4.0000 mg | ORAL_TABLET | Freq: Three times a day (TID) | ORAL | 0 refills | Status: DC | PRN

## 2017-07-09 MED ORDER — ONDANSETRON HCL 4 MG/2ML IJ SOLN
4.0000 mg | Freq: Once | INTRAMUSCULAR | Status: AC
  Administered 2017-07-09: 4 mg via INTRAVENOUS
  Filled 2017-07-09: qty 2

## 2017-07-09 MED ORDER — FENTANYL CITRATE (PF) 100 MCG/2ML IJ SOLN
12.5000 ug | Freq: Once | INTRAMUSCULAR | Status: AC
  Administered 2017-07-09: 12.5 ug via INTRAVENOUS
  Filled 2017-07-09: qty 2

## 2017-07-09 MED ORDER — SODIUM CHLORIDE 0.9 % IV BOLUS (SEPSIS)
500.0000 mL | Freq: Once | INTRAVENOUS | Status: AC
  Administered 2017-07-09: 500 mL via INTRAVENOUS

## 2017-07-09 NOTE — ED Notes (Signed)
Pt is now getting agitated stated, "Im ready to go home. I've been here forever" Notified pt that the doctor would look at her lab results and would come update her on the information.

## 2017-07-09 NOTE — Discharge Instructions (Signed)
Tests showed no life-threatening condition. Increase fluids. Prescription for nausea. Follow-up your primary care doctor.

## 2017-07-09 NOTE — ED Triage Notes (Signed)
Pt c/o cp with n/v/sweating and sob since yesterday.

## 2017-07-11 NOTE — ED Provider Notes (Signed)
[facility] DEPT Provider Note   CSN: [id number] Arrival date & time: 07/09/17  1012     History   Chief Complaint Chief Complaint  Patient presents with  . Chest Pain    HPI Jacqueline Cervantes is a 82 y.o. female.  C/o nausea, vomiting, sweating, dyspnea since yesterday, feeling better today.  No SSCP, fever, sweats, chills, dysuria.  Severity of symptoms is mild. Nothing makes symptoms better or worse.      Past Medical History:  Diagnosis Date  . Anxiety   . Arthritis    "fingers" (09/03/2015)  . Cancer of left breast ([place])   . COPD with acute exacerbation ([place])    Archie Endo 09/03/2015  . COPD with chronic bronchitis ([place])    Archie Endo 09/03/2015  . Depression   . GERD (gastroesophageal reflux disease)   . Hypercholesteremia   . Hypertension   . On home oxygen therapy    "I use my husband's oxygen 2-3 times/day; 3L" (09/03/2015)  . Skin cancer    "face/nose"  . Type II diabetes mellitus [facility])     Patient Active Problem List   Diagnosis Date Noted  . Chronic respiratory failure with hypoxia ([place]) 05/31/2016  . COPD, severe ([place]) 05/31/2016  . Hemorrhoids 05/03/2016  . Generalized anxiety disorder 07/11/2014  . Tobacco abuse, in remission 08/02/2013  . HTN (hypertension) 05/02/2013  . HLD (hyperlipidemia) 05/02/2013  . DM (diabetes mellitus) ([place]) 05/02/2013  . Osteoporosis 05/02/2013    Past Surgical History:  Procedure Laterality Date  . ABDOMINAL HYSTERECTOMY    . BREAST BIOPSY Left   . CATARACT EXTRACTION, BILATERAL Bilateral   . DILATION AND CURETTAGE OF UTERUS    . FLEXIBLE SIGMOIDOSCOPY  2014   [facility]  . MASTECTOMY Left 05/11/2006  . SKIN CANCER EXCISION     "cut it off my nose; went right down the middle, on top"    OB History    Gravida Para Term Preterm AB Living   1 1 1     1    SAB TAB Ectopic Multiple Live Births                   Home Medications    Prior to Admission medications   Medication Sig Start Date End Date  Taking? Authorizing Provider  acetaminophen (TYLENOL) 500 MG tablet Take 500 mg by mouth every 6 (six) hours as needed for mild pain.   Yes [provider]  albuterol (PROVENTIL HFA;VENTOLIN HFA) 108 (90 Base) MCG/ACT inhaler Inhale 2 puffs into the lungs every 6 (six) hours as needed for wheezing or shortness of breath. 11/29/16  Yes Hassell Done, Mary-Margaret, FNP  alendronate (FOSAMAX) 70 MG tablet TAKE 1 TABLET BY MOUTH EVERY WEEK WITH A FULL GLASS OF WATER ON AN EMPTY STOMACH ON FRIDAYS 05/12/17  Yes Hassell Done, Mary-Margaret, FNP  ALPRAZolam Duanne Moron) 0.5 MG tablet TAKE 1 TABLET 2 TIMES A DAY AS NEEDED 07/07/17  Yes Hassell Done, Mary-Margaret, FNP  AMBULATORY NON FORMULARY MEDICATION 2% Diltiazem gel mixed with 5% Lidocaine, apply to rectum three times a day 05/15/17  Yes Gatha Mayer, MD  amLODipine (NORVASC) 10 MG tablet Take 1 tablet (10 mg total) by mouth daily. 11/29/16  Yes Martin, Mary-Margaret, FNP  atorvastatin (LIPITOR) 40 MG tablet Take 1 tablet (40 mg total) by mouth daily. Patient taking differently: Take 40 mg by mouth every evening.  11/29/16  Yes Martin, Mary-Margaret, FNP  budesonide-formoterol (SYMBICORT) 160-4.5 MCG/ACT inhaler Inhale 2 puffs into the lungs 2 (  two) times daily. 11/29/16  Yes Martin, Mary-Margaret, FNP  citalopram (CELEXA) 20 MG tablet TAKE 1 TABLET (20 MG TOTAL) BY MOUTH DAILY. 06/25/17  Yes Martin, Mary-Margaret, FNP  fenofibrate 160 MG tablet Take 1 tablet (160 mg total) by mouth daily. 11/29/16  Yes Martin, Mary-Margaret, FNP  fluticasone furoate-vilanterol (BREO ELLIPTA) 100-25 MCG/INH AEPB Inhale 1 puff into the lungs daily. 11/29/16  Yes Martin, Mary-Margaret, FNP  hydrochlorothiazide (MICROZIDE) 12.5 MG capsule Take 1 capsule (12.5 mg total) by mouth daily. 11/29/16  Yes Martin, Mary-Margaret, FNP  ipratropium-albuterol (DUONEB) 0.5-2.5 (3) MG/3ML SOLN Take 3 mLs by nebulization 4 (four) times daily as needed. Dx: J44.9 04/20/17  Yes Brand Males, MD  losartan  (COZAAR) 25 MG tablet TAKE 1 TABLET BY MOUTH EVERY DAY 06/25/17  Yes Hassell Done, Mary-Margaret, FNP  metFORMIN (GLUCOPHAGE) 500 MG tablet TAKE 2 TABLETS IN THE MORNING AND 1 TABLET IN THE EVENING WITH MEALS. 04/25/17  Yes Hassell Done, Mary-Margaret, FNP  Omega-3 Fatty Acids (FISH OIL PO) Take 1 capsule by mouth daily.   Yes [provider]  OXYGEN Inhale 2 L/min into the lungs.   Yes [provider]  polyethylene glycol (MIRALAX / GLYCOLAX) packet Take 17 g by mouth daily.   Yes [provider]  ondansetron (ZOFRAN ODT) 4 MG disintegrating tablet Take 1 tablet (4 mg total) by mouth every 8 (eight) hours as needed for nausea or vomiting. 07/09/17   Nat Christen, MD    Family History Family History  Problem Relation Age of Onset  . Diabetes Mother   . Cancer Father        unknown  . Cancer Sister   . Diabetes Sister   . Cancer Brother   . Diabetes Brother   . Diabetes Brother   . Colon cancer Neg Hx   . Stomach cancer Neg Hx   . Esophageal cancer Neg Hx     Social History Social History  Substance Use Topics  . Smoking status: Current Every Day Smoker    Packs/day: 0.25    Years: 52.00    Types: Cigarettes    Last attempt to quit: 05/10/2016  . Smokeless tobacco: Never Used     Comment: Pt states she has been smoking intermittently for 52 years.   . Alcohol use No     Allergies   Patient has no known allergies.   Review of Systems Review of Systems  All other systems reviewed and are negative.    Physical Exam Updated Vital Signs BP (!) 159/88   Pulse (!) 101   Temp 97.9 F (36.6 C)   Resp 19   Ht 5\' 4"  (1.626 m)   Wt 53.1 kg (117 lb)   SpO2 94%   BMI 20.08 kg/m   Physical Exam  Constitutional: She is oriented to person, place, and time. She appears well-developed and well-nourished.  HENT:  Head: Normocephalic and atraumatic.  Eyes: Conjunctivae are normal.  Neck: Neck supple.  Cardiovascular: Normal rate and regular rhythm.     Pulmonary/Chest: Effort normal and breath sounds normal.  Abdominal: Soft. Bowel sounds are normal.  Musculoskeletal: Normal range of motion.  Neurological: She is alert and oriented to person, place, and time.  Skin: Skin is warm and dry.  Psychiatric: She has a normal mood and affect. Her behavior is normal.  Nursing note and vitals reviewed.    ED Treatments / Results  Labs (all labs ordered are listed, but only abnormal results are displayed) Labs Reviewed  CBC WITH  DIFFERENTIAL/PLATELET - Abnormal; Notable for the following:       Result Value   WBC 16.1 (*)    Hemoglobin 11.2 (*)    HCT 33.5 (*)    RDW 16.3 (*)    Platelets 508 (*)    Neutro Abs 12.9 (*)    Monocytes Absolute 1.2 (*)    All other components within normal limits  COMPREHENSIVE METABOLIC PANEL - Abnormal; Notable for the following:    Chloride 86 (*)    Glucose, Bld 121 (*)    Calcium 10.8 (*)    Total Protein 8.9 (*)    ALT 12 (*)    Alkaline Phosphatase 37 (*)    Anion gap 17 (*)    All other components within normal limits  URINALYSIS, ROUTINE W REFLEX MICROSCOPIC - Abnormal; Notable for the following:    APPearance HAZY (*)    Hgb urine dipstick SMALL (*)    Protein, ur 100 (*)    Bacteria, UA RARE (*)    All other components within normal limits  TROPONIN I  LIPASE, BLOOD    EKG  EKG Interpretation  Date/Time:  Sunday July 09 2017 10:21:41 EDT Ventricular Rate:  96 PR Interval:    QRS Duration: 91 QT Interval:  361 QTC Calculation: 457 R Axis:   55 Text Interpretation:  Sinus rhythm Probable left atrial enlargement RSR' in V1 or V2, right VCD or RVH LVH with secondary repolarization abnormality Confirmed by Nat Christen [phone]) on 07/09/2017 11:52:24 AM       Radiology Dg Chest Port 1 View  Result Date: 07/09/2017 CLINICAL DATA:  Chest pain since yesterday. EXAM: PORTABLE CHEST 1 VIEW COMPARISON:  02/27/2017. FINDINGS: Decreased inspiration with interval borderline enlarged  cardiac silhouette and prominent pulmonary vasculature. Stable mild prominence of the interstitial markings. The aorta remains tortuous and partially calcified. Thoracic spine degenerative changes. IMPRESSION: Poor inspiration with interval borderline cardiomegaly and mild pulmonary vascular congestion superimposed on COPD. Electronically Signed   By: Claudie Revering M.D.   On: 07/09/2017 10:57    Procedures Procedures (including critical care time)  Medications Ordered in ED Medications  ondansetron (ZOFRAN) injection 4 mg (4 mg Intravenous Given 07/09/17 1042)  sodium chloride 0.9 % bolus 500 mL (0 mLs Intravenous Stopped 07/09/17 1142)  fentaNYL (SUBLIMAZE) injection 12.5 mcg (12.5 mcg Intravenous Given 07/09/17 1042)  sodium chloride 0.9 % bolus 500 mL (0 mLs Intravenous Stopped 07/09/17 1337)     Initial Impression / Assessment and Plan / ED Course  I have reviewed the triage vital signs and the nursing notes.  Pertinent labs & imaging results that were available during my care of the patient were reviewed by me and considered in my medical decision making (see chart for details).    Patient is in no acute distress. Screening labs and urinalysis are reassuring. She feels better after IV fluids. She wants to go home. Discharge medication Zofran 4 mg   Final Clinical Impressions(s) / ED Diagnoses   Final diagnoses:  Intractable vomiting with nausea, unspecified vomiting type    New Prescriptions Discharge Medication List as of 07/09/2017  2:44 PM    START taking these medications   Details  ondansetron (ZOFRAN ODT) 4 MG disintegrating tablet Take 1 tablet (4 mg total) by mouth every 8 (eight) hours as needed for nausea or vomiting., Starting Sun 07/09/2017, Print         Nat Christen, MD 07/11/17 [phone]

## 2017-07-21 ENCOUNTER — Other Ambulatory Visit: Payer: Self-pay | Admitting: Nurse Practitioner

## 2017-07-28 IMAGING — CT CT ABD-PELV W/ CM
2 of 5 series · 16 of 46 positions shown, 18 images · IV contrast (Omnipaque 300)
Comparison: CT scan of June 18, 2013.

CLINICAL DATA: Acute lower abdominal pain.

EXAM:
CT ABDOMEN AND PELVIS WITH CONTRAST
TECHNIQUE: Multidetector CT imaging of the abdomen and pelvis was performed
using the standard protocol following bolus administration of
intravenous contrast.
CONTRAST:  100mL XPEB10-P77 IOPAMIDOL (XPEB10-P77) INJECTION 61%

[Series 2: abd_pel_with 5.0 b40f · axial · 0.67mm/px · z∈[-453,-88]mm · 13 of 83 slices shown, 15 images]
[im 5/83  soft-tissue]
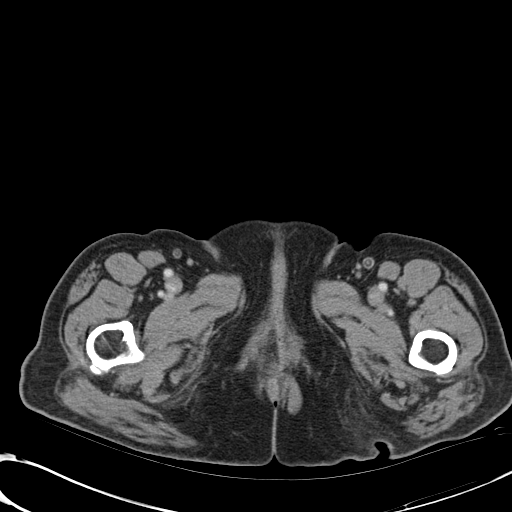
[im 5/83  bone]
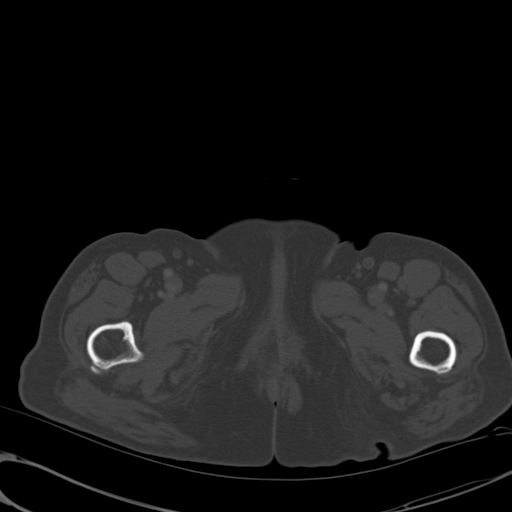
[im 10/83  soft-tissue]
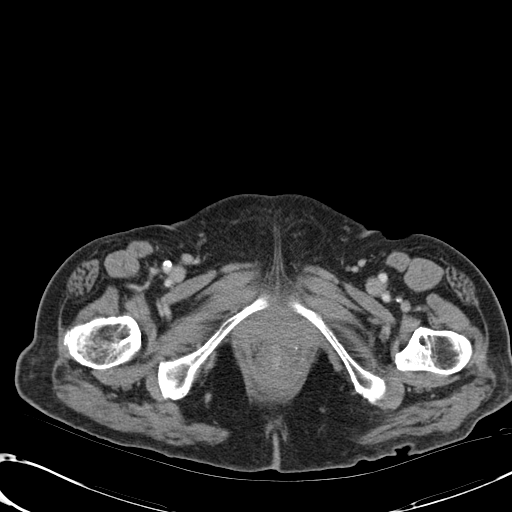
[im 19/83  soft-tissue]
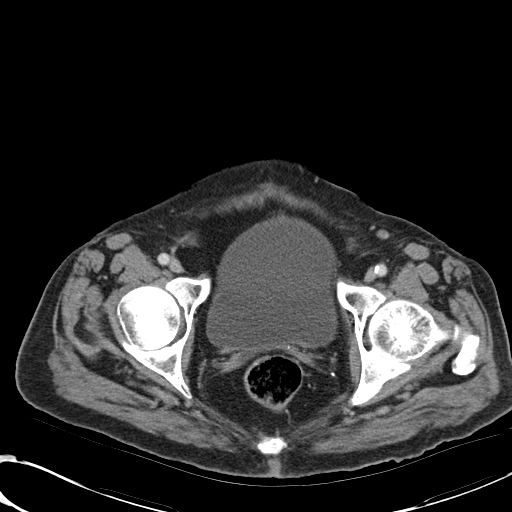
[im 23/83  soft-tissue]
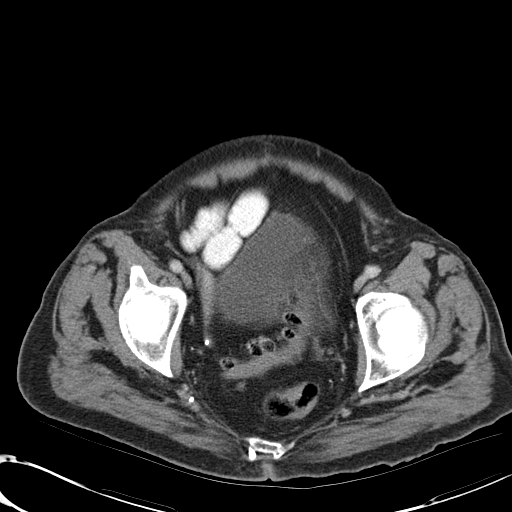
[im 28/83  soft-tissue]
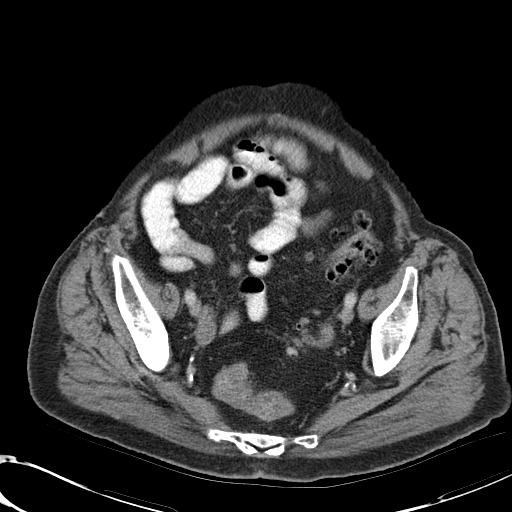
[im 37/83  soft-tissue]
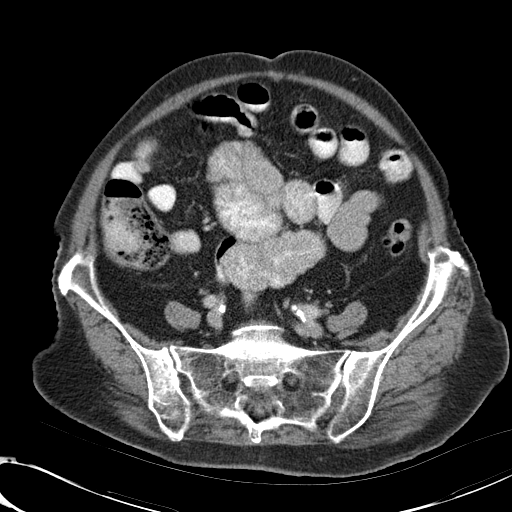
[im 42/83  soft-tissue]
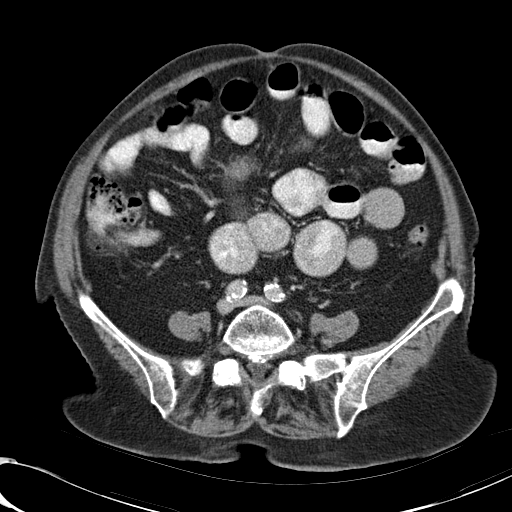
[im 46/83  soft-tissue]
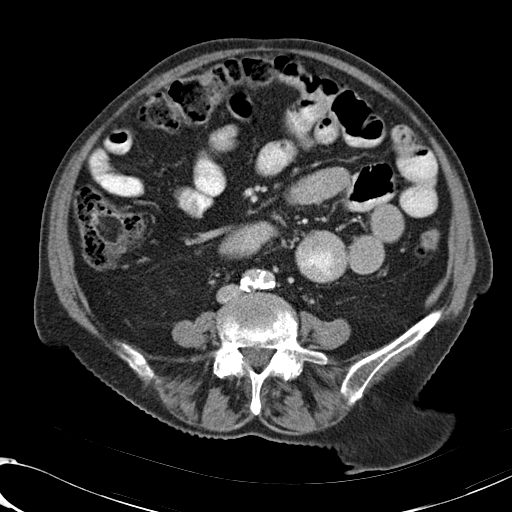
[im 55/83  soft-tissue]
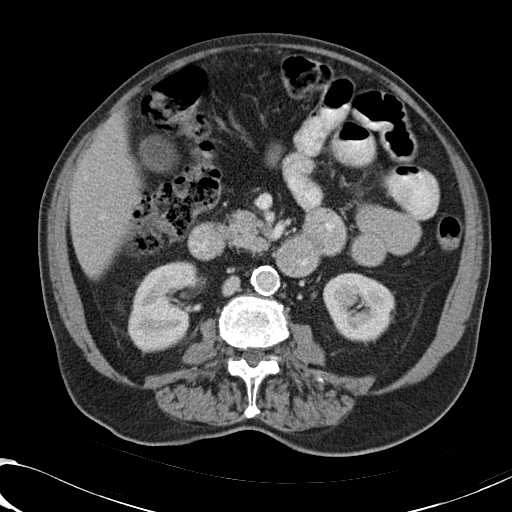
[im 55/83  bone]
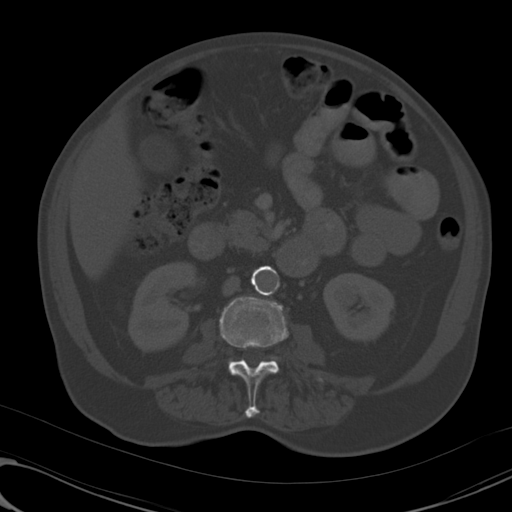
[im 60/83  soft-tissue]
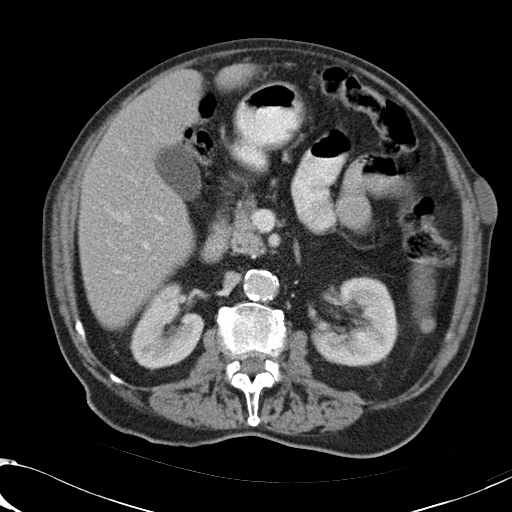
[im 64/83  soft-tissue]
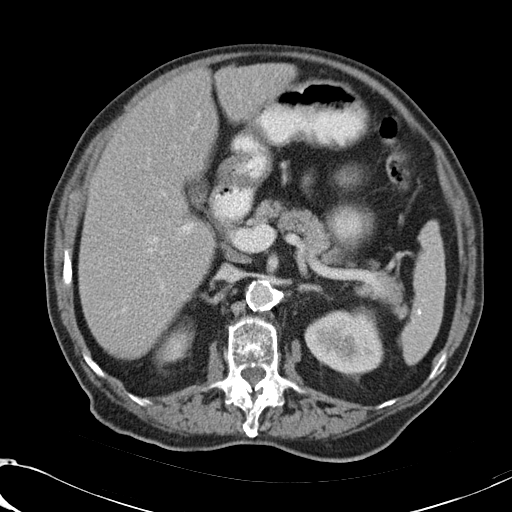
[im 73/83  soft-tissue]
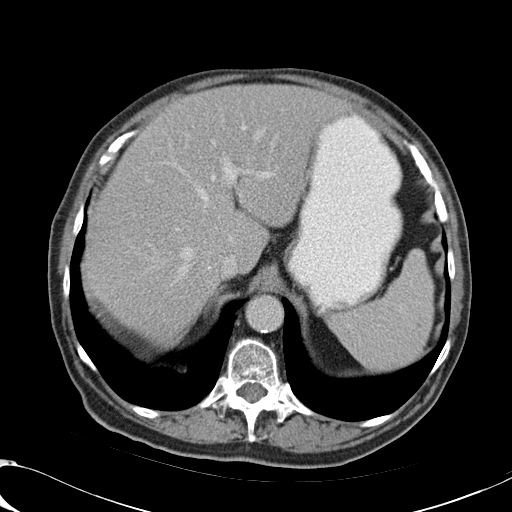
[im 78/83  soft-tissue]
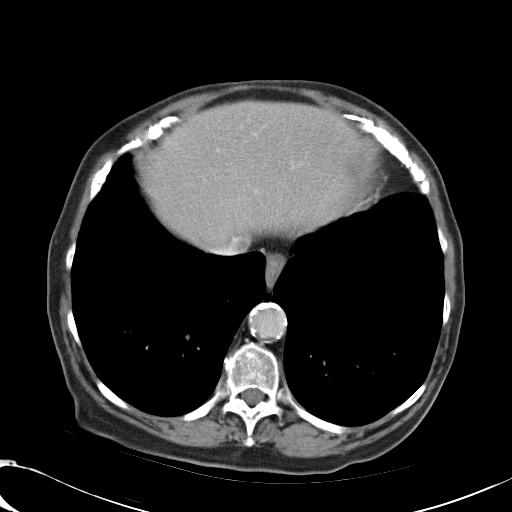

[Series 3: abd_pel_with 3.0 spo cor · coronal · 0.63mm/px · 3 of 107 slices shown]
[im 36/107  soft-tissue]
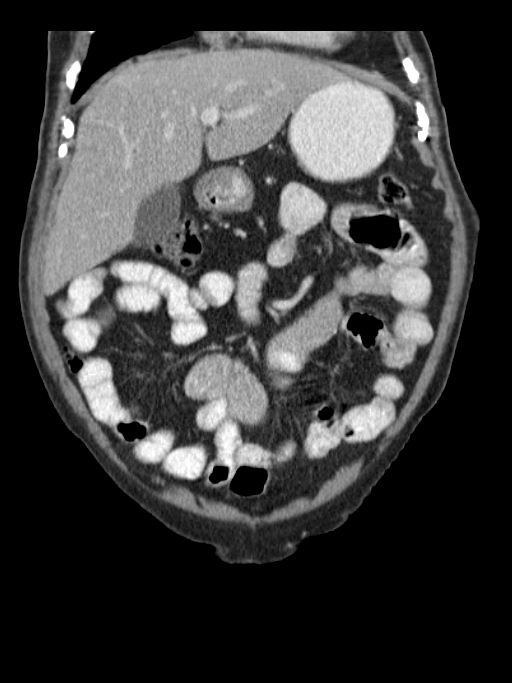
[im 48/107  soft-tissue]
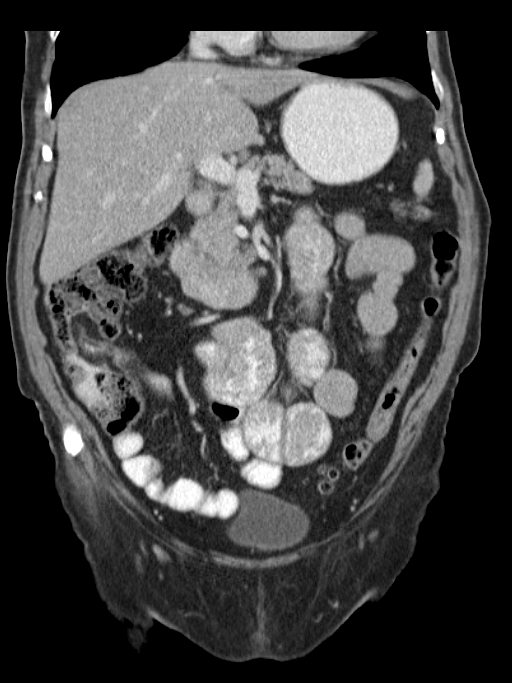
[im 59/107  soft-tissue]
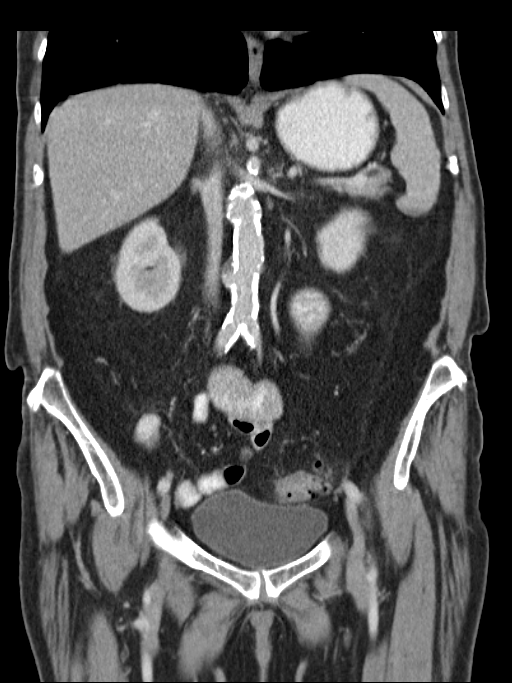

[16 of 46 positions shown; findings below may reference images not displayed]

FINDINGS: Old L1 compression fracture is noted. Visualized lung bases are
unremarkable.

No gallstones are noted. The liver, spleen and pancreas
unremarkable. Adrenal glands and kidneys appear normal. No
hydronephrosis or renal obstruction is noted. No renal or ureteral
calculi are noted. Atherosclerosis of abdominal aorta is noted
without aneurysm formation. There is no evidence of bowel
obstruction. Sigmoid diverticulosis is noted without inflammation.
No abnormal fluid collection is noted. Status post hysterectomy.
Ovaries are unremarkable. No significant adenopathy is noted.
IMPRESSION: Atherosclerosis of abdominal aorta without aneurysm formation.

Sigmoid diverticulosis without inflammation.

No acute abnormality seen in the abdomen or pelvis.

## 2017-08-03 DIAGNOSIS — J449 Chronic obstructive pulmonary disease, unspecified: Secondary | ICD-10-CM | POA: Diagnosis not present

## 2017-08-04 DIAGNOSIS — J441 Chronic obstructive pulmonary disease with (acute) exacerbation: Secondary | ICD-10-CM | POA: Diagnosis not present

## 2017-08-07 ENCOUNTER — Other Ambulatory Visit: Payer: Self-pay | Admitting: Nurse Practitioner

## 2017-08-08 NOTE — Telephone Encounter (Signed)
Please call in alprazolam with 1 refills 

## 2017-08-08 NOTE — Telephone Encounter (Signed)
Rx called to pharmacy

## 2017-08-22 ENCOUNTER — Other Ambulatory Visit: Payer: Medicare Other

## 2017-08-22 ENCOUNTER — Other Ambulatory Visit: Payer: Self-pay | Admitting: Nurse Practitioner

## 2017-08-29 DIAGNOSIS — J449 Chronic obstructive pulmonary disease, unspecified: Secondary | ICD-10-CM | POA: Diagnosis not present

## 2017-09-04 DIAGNOSIS — J441 Chronic obstructive pulmonary disease with (acute) exacerbation: Secondary | ICD-10-CM | POA: Diagnosis not present

## 2017-09-29 DIAGNOSIS — J449 Chronic obstructive pulmonary disease, unspecified: Secondary | ICD-10-CM | POA: Diagnosis not present

## 2017-10-03 ENCOUNTER — Other Ambulatory Visit: Payer: Self-pay | Admitting: Nurse Practitioner

## 2017-10-03 NOTE — Telephone Encounter (Signed)
Last seen 06/20/17 MMM  If approved route to nurse to call into [facility]

## 2017-10-03 NOTE — Telephone Encounter (Signed)
Please call in alprozolam with 1 refills

## 2017-10-04 DIAGNOSIS — J441 Chronic obstructive pulmonary disease with (acute) exacerbation: Secondary | ICD-10-CM | POA: Diagnosis not present

## 2017-10-04 NOTE — Telephone Encounter (Signed)
rx called into pharmacy

## 2017-10-18 ENCOUNTER — Ambulatory Visit (INDEPENDENT_AMBULATORY_CARE_PROVIDER_SITE_OTHER): Payer: Medicare Other

## 2017-10-18 ENCOUNTER — Other Ambulatory Visit: Payer: Self-pay | Admitting: Nurse Practitioner

## 2017-10-18 DIAGNOSIS — Z23 Encounter for immunization: Secondary | ICD-10-CM

## 2017-10-20 NOTE — Telephone Encounter (Signed)
last seen 06/20/17  MMM

## 2017-10-24 DIAGNOSIS — J449 Chronic obstructive pulmonary disease, unspecified: Secondary | ICD-10-CM | POA: Diagnosis not present

## 2017-10-25 ENCOUNTER — Ambulatory Visit (INDEPENDENT_AMBULATORY_CARE_PROVIDER_SITE_OTHER): Payer: Medicare Other | Admitting: Internal Medicine

## 2017-10-25 ENCOUNTER — Encounter: Payer: Self-pay | Admitting: Internal Medicine

## 2017-10-25 ENCOUNTER — Encounter (INDEPENDENT_AMBULATORY_CARE_PROVIDER_SITE_OTHER): Payer: Self-pay

## 2017-10-25 VITALS — BP 112/56 | HR 95 | Ht 64.0 in | Wt 111.8 lb

## 2017-10-25 DIAGNOSIS — K602 Anal fissure, unspecified: Secondary | ICD-10-CM

## 2017-10-25 DIAGNOSIS — K642 Third degree hemorrhoids: Secondary | ICD-10-CM | POA: Diagnosis not present

## 2017-10-25 MED ORDER — AMBULATORY NON FORMULARY MEDICATION: 3 refills | Status: AC

## 2017-10-25 NOTE — Progress Notes (Signed)
Jacqueline Cervantes 82 y.o. January 17, 1935 [id number]  Assessment & Plan:   Prolapsed internal hemorrhoids, grade 3 RP grade 3 internal hemorrhoid banded  Anal fissure Posterior anal fissure is better Will retreat w/ diltiazem gel 2% x 1 month at least - cost is an issue  NO:[facility], Mary-Margaret, FNP  Subjective:   Chief Complaint: hemorrhoids "please fix them" HPI Prolapsing painful hemorrhoids after defecation - no bleeding On laxative - no BM problems  Pain same as in May - Rx lidocaine/diltiazem then   No Known Allergies Current Meds  Medication Sig  . acetaminophen (TYLENOL) 500 MG tablet Take 500 mg by mouth every 6 (six) hours as needed for mild pain.  Marland Kitchen albuterol (PROVENTIL HFA;VENTOLIN HFA) 108 (90 Base) MCG/ACT inhaler Inhale 2 puffs into the lungs every 6 (six) hours as needed for wheezing or shortness of breath.  Marland Kitchen alendronate (FOSAMAX) 70 MG tablet TAKE 1 TABLET BY MOUTH EVERY WEEK WITH A FULL GLASS OF WATER ON AN EMPTY STOMACH ON FRIDAYS  . ALPRAZolam (XANAX) 0.5 MG tablet TAKE 1 TABLET 2 TIMES DAILY AS NEEDED  . AMBULATORY NON FORMULARY MEDICATION 2% Diltiazem gel mixed with 5% Lidocaine, apply to rectum three times a day  . amLODipine (NORVASC) 10 MG tablet Take 1 tablet (10 mg total) by mouth daily.  Marland Kitchen atorvastatin (LIPITOR) 40 MG tablet Take 1 tablet (40 mg total) by mouth daily. (Patient taking differently: Take 40 mg by mouth every evening. )  . budesonide-formoterol (SYMBICORT) 160-4.5 MCG/ACT inhaler Inhale 2 puffs into the lungs 2 (two) times daily.  . citalopram (CELEXA) 20 MG tablet TAKE 1 TABLET (20 MG TOTAL) BY MOUTH DAILY.  . fenofibrate 160 MG tablet Take 1 tablet (160 mg total) by mouth daily.  . fluticasone furoate-vilanterol (BREO ELLIPTA) 100-25 MCG/INH AEPB Inhale 1 puff into the lungs daily.  . hydrochlorothiazide (MICROZIDE) 12.5 MG capsule Take 1 capsule (12.5 mg total) by mouth daily.  Marland Kitchen ipratropium-albuterol (DUONEB) 0.5-2.5 (3) MG/3ML SOLN  Take 3 mLs by nebulization 4 (four) times daily as needed. Dx: J44.9  . losartan (COZAAR) 25 MG tablet TAKE 1 TABLET BY MOUTH EVERY DAY  . metFORMIN (GLUCOPHAGE) 500 MG tablet TAKE 2 TABLETS IN THE MORNING AND 1 TABLET IN THE EVENING WITH MEALS.  Marland Kitchen Omega-3 Fatty Acids (FISH OIL PO) Take 1 capsule by mouth daily.  . ondansetron (ZOFRAN ODT) 4 MG disintegrating tablet Take 1 tablet (4 mg total) by mouth every 8 (eight) hours as needed for nausea or vomiting.  . OXYGEN Inhale 2 L/min into the lungs.  . polyethylene glycol (MIRALAX / GLYCOLAX) packet Take 17 g by mouth daily.   Past Medical History:  Diagnosis Date  . Anxiety   . Arthritis    "fingers" (09/03/2015)  . Cancer of left breast ([place])   . COPD with acute exacerbation ([place])    Archie Endo 09/03/2015  . COPD with chronic bronchitis ([place])    Archie Endo 09/03/2015  . Depression   . GERD (gastroesophageal reflux disease)   . Hypercholesteremia   . Hypertension   . On home oxygen therapy    "I use my husband's oxygen 2-3 times/day; 3L" (09/03/2015)  . Skin cancer    "face/nose"  . Type II diabetes mellitus ([place])    Past Surgical History:  Procedure Laterality Date  . ABDOMINAL HYSTERECTOMY    . BREAST BIOPSY Left   . CATARACT EXTRACTION, BILATERAL Bilateral   . DILATION AND CURETTAGE OF UTERUS    . FLEXIBLE SIGMOIDOSCOPY  2014   [facility]  . MASTECTOMY Left 05/11/2006  . SKIN CANCER EXCISION     "cut it off my nose; went right down the middle, on top"  Review of Systems Uses wheelchair  Objective:   Physical Exam BP (!) 112/56   Pulse 95   Ht 5\' 4"  (1.626 m)   Wt 111 lb 12.8 oz (50.7 kg)   SpO2 90%   BMI 19.19 kg/m  Frail, elderly and on O2  Female staff present   Rectal:  Gr 3 prolapsed RP internal hemorrhoid - friable DRE slightly tender posterior  and more open than in past w/ fissure  Anoscopy:   Gr 3 RP and Gr 2 LL RA internal hemorrhoids  PROCEDURE NOTE: The patient presents with symptomatic grade 2  and 3  hemorrhoids, requesting rubber band ligation of his/her hemorrhoidal disease.  All risks, benefits and alternative forms of therapy were described and informed consent was obtained.   The anorectum was pre-medicated with0.125% NTG and 5% lidocaine The decision was made to band the RP internal hemorrhoid, and the [place] was used to perform band ligation without complication.  Digital anorectal examination was then performed to assure proper positioning of the band, and to adjust the banded tissue as required.  The patient was discharged home without pain or other issues.  Dietary and behavioral recommendations were given and along with follow-up instructions.     The following adjunctive treatments were recommended:  Diltiazem 2%/lidoacine 5% cram  The patient will return in 2 weeks for  follow-up and possible additional banding as required. No complications were encountered and the patient tolerated the procedure well.

## 2017-10-25 NOTE — Patient Instructions (Addendum)
  HEMORRHOID BANDING PROCEDURE    FOLLOW-UP CARE   1. The procedure you have had should have been relatively painless since the banding of the area involved does not have nerve endings and there is no pain sensation.  The rubber band cuts off the blood supply to the hemorrhoid and the band may fall off as soon as 48 hours after the banding (the band may occasionally be seen in the toilet bowl following a bowel movement). You may notice a temporary feeling of fullness in the rectum which should respond adequately to plain Tylenol or Motrin.  2. Following the banding, avoid strenuous exercise that evening and resume full activity the next day.  A sitz bath (soaking in a warm tub) or bidet is soothing, and can be useful for cleansing the area after bowel movements.     3. To avoid constipation, take two tablespoons of natural wheat bran, natural oat bran, flax, Benefiber or any over the counter fiber supplement and increase your water intake to 7-8 glasses daily.    4. Unless you have been prescribed anorectal medication, do not put anything inside your rectum for two weeks: No suppositories, enemas, fingers, etc.  5. Occasionally, you may have more bleeding than usual after the banding procedure.  This is often from the untreated hemorrhoids rather than the treated one.  Don't be concerned if there is a tablespoon or so of blood.  If there is more blood than this, lie flat with your bottom higher than your head and apply an ice pack to the area. If the bleeding does not stop within a half an hour or if you feel faint, call our office at [phone] or go to the emergency room.  6. Problems are not common; however, if there is a substantial amount of bleeding, severe pain, chills, fever or difficulty passing urine (very rare) or other problems, you should call us at [phone] or report to the nearest emergency room.  7. Do not stay seated continuously for more than 2-3 hours for a day or  two after the procedure.  Tighten your buttock muscles 10-15 times every two hours and take 10-15 deep breaths every 1-2 hours.  Do not spend more than a few minutes on the toilet if you cannot empty your bowel; instead re-visit the toilet at a later time.    We will see you back for more bandings on 11/10/17 at 3:15pm.   I appreciate the opportunity to care for you. Silvano Rusk, MD, [facility]

## 2017-10-31 DIAGNOSIS — K602 Anal fissure, unspecified: Secondary | ICD-10-CM | POA: Insufficient documentation

## 2017-10-31 NOTE — Assessment & Plan Note (Signed)
Posterior anal fissure is better Will retreat w/ diltiazem gel 2% x 1 month at least - cost is an issue

## 2017-10-31 NOTE — Assessment & Plan Note (Signed)
RP grade 3 internal hemorrhoid banded

## 2017-11-04 DIAGNOSIS — J441 Chronic obstructive pulmonary disease with (acute) exacerbation: Secondary | ICD-10-CM | POA: Diagnosis not present

## 2017-11-10 ENCOUNTER — Ambulatory Visit: Payer: Medicare Other | Admitting: Internal Medicine

## 2017-11-10 ENCOUNTER — Encounter: Payer: Self-pay | Admitting: Internal Medicine

## 2017-11-10 DIAGNOSIS — K602 Anal fissure, unspecified: Secondary | ICD-10-CM

## 2017-11-10 DIAGNOSIS — K642 Third degree hemorrhoids: Secondary | ICD-10-CM

## 2017-11-10 NOTE — Assessment & Plan Note (Signed)
RA and LL banded RTC prn

## 2017-11-10 NOTE — Assessment & Plan Note (Signed)
Better Stay on diltiazem/lidocaine

## 2017-11-10 NOTE — Progress Notes (Signed)
   The patient returns for follow-up of anal fissure and prolapsed hemorrhoids she had a grade 3 internal hemorrhoid in the right posterior position banded on October 31.  She said she had a lot of pain for a minute or 2 after I did that and she does not want to go through that again.  She is back on her diltiazem lidocaine and is having less pain and bleeding but still complains of prolapsing of hemorrhoids and wants these fixed.  Magdalene River CMA present  Rectal exam reveals normal anoderm mildly tender mild anal stenosis and some mild induration in the posterior canal.  PROCEDURE NOTE: The patient presents with symptomatic grade 2  hemorrhoids, requesting rubber band ligation of his/her hemorrhoidal disease.  All risks, benefits and alternative forms of therapy were described and informed consent was obtained.   The anorectum was pre-medicated with NTG 0.125% and lidocaine 5% The decision was made to band the LL and RA internal hemorrhoids, and the [place] was used to perform band ligation without complication.  Digital anorectal examination was then performed to assure proper positioning of the band, and to adjust the banded tissue as required.  The patient was discharged home without pain or other issues.  Dietary and behavioral recommendations were given and along with follow-up instructions.     The following adjunctive treatments were recommended: Continue diltiazem lidocaine topical treatment for fissure  The patient will return as needed for  follow-up and possible additional banding as required. No complications were encountered and the patient tolerated the procedure well.

## 2017-11-10 NOTE — Patient Instructions (Signed)
HEMORRHOID BANDING PROCEDURE    FOLLOW-UP CARE   1. The procedure you have had should have been relatively painless since the banding of the area involved does not have nerve endings and there is no pain sensation.  The rubber band cuts off the blood supply to the hemorrhoid and the band may fall off as soon as 48 hours after the banding (the band may occasionally be seen in the toilet bowl following a bowel movement). You may notice a temporary feeling of fullness in the rectum which should respond adequately to plain Tylenol or Motrin.  2. Following the banding, avoid strenuous exercise that evening and resume full activity the next day.  A sitz bath (soaking in a warm tub) or bidet is soothing, and can be useful for cleansing the area after bowel movements.     3. To avoid constipation, take two tablespoons of natural wheat bran, natural oat bran, flax, Benefiber or any over the counter fiber supplement and increase your water intake to 7-8 glasses daily.    4. Unless you have been prescribed anorectal medication, do not put anything inside your rectum for two weeks: No suppositories, enemas, fingers, etc.  5. Occasionally, you may have more bleeding than usual after the banding procedure.  This is often from the untreated hemorrhoids rather than the treated one.  Don't be concerned if there is a tablespoon or so of blood.  If there is more blood than this, lie flat with your bottom higher than your head and apply an ice pack to the area. If the bleeding does not stop within a half an hour or if you feel faint, call our office at [phone] or go to the emergency room.  6. Problems are not common; however, if there is a substantial amount of bleeding, severe pain, chills, fever or difficulty passing urine (very rare) or other problems, you should call us at [phone] or report to the nearest emergency room.  7. Do not stay seated continuously for more than 2-3 hours for a day or two  after the procedure.  Tighten your buttock muscles 10-15 times every two hours and take 10-15 deep breaths every 1-2 hours.  Do not spend more than a few minutes on the toilet if you cannot empty your bowel; instead re-visit the toilet at a later time.    Follow up with Dr Carlean Purl as needed.   Continue using your rectal cream.   I appreciate the opportunity to care for you. Silvano Rusk, MD, [facility]

## 2017-11-13 DIAGNOSIS — J449 Chronic obstructive pulmonary disease, unspecified: Secondary | ICD-10-CM | POA: Diagnosis not present

## 2017-11-14 ENCOUNTER — Ambulatory Visit (INDEPENDENT_AMBULATORY_CARE_PROVIDER_SITE_OTHER): Payer: Medicare Other | Admitting: Nurse Practitioner

## 2017-11-14 ENCOUNTER — Encounter: Payer: Self-pay | Admitting: Nurse Practitioner

## 2017-11-14 VITALS — BP 128/61 | HR 102 | Temp 98.9°F | Ht 64.0 in | Wt 111.0 lb

## 2017-11-14 DIAGNOSIS — J449 Chronic obstructive pulmonary disease, unspecified: Secondary | ICD-10-CM

## 2017-11-14 DIAGNOSIS — I10 Essential (primary) hypertension: Secondary | ICD-10-CM

## 2017-11-14 DIAGNOSIS — R5383 Other fatigue: Secondary | ICD-10-CM | POA: Diagnosis not present

## 2017-11-14 DIAGNOSIS — R6889 Other general symptoms and signs: Secondary | ICD-10-CM | POA: Diagnosis not present

## 2017-11-14 DIAGNOSIS — E089 Diabetes mellitus due to underlying condition without complications: Secondary | ICD-10-CM | POA: Diagnosis not present

## 2017-11-14 DIAGNOSIS — E785 Hyperlipidemia, unspecified: Secondary | ICD-10-CM | POA: Diagnosis not present

## 2017-11-14 DIAGNOSIS — F411 Generalized anxiety disorder: Secondary | ICD-10-CM

## 2017-11-14 DIAGNOSIS — J9611 Chronic respiratory failure with hypoxia: Secondary | ICD-10-CM

## 2017-11-14 DIAGNOSIS — M810 Age-related osteoporosis without current pathological fracture: Secondary | ICD-10-CM

## 2017-11-14 DIAGNOSIS — E559 Vitamin D deficiency, unspecified: Secondary | ICD-10-CM

## 2017-11-14 DIAGNOSIS — E782 Mixed hyperlipidemia: Secondary | ICD-10-CM | POA: Diagnosis not present

## 2017-11-14 LAB — BAYER DCA HB A1C WAIVED: HB A1C: 5.6 % (ref <7.0)

## 2017-11-14 MED ORDER — ATORVASTATIN CALCIUM 40 MG PO TABS: 40.0000 mg | ORAL_TABLET | Freq: Every day | ORAL | 3 refills | Status: DC

## 2017-11-14 MED ORDER — AMLODIPINE BESYLATE 10 MG PO TABS: 10.0000 mg | ORAL_TABLET | Freq: Every day | ORAL | 3 refills | Status: DC

## 2017-11-14 MED ORDER — BUDESONIDE-FORMOTEROL FUMARATE 160-4.5 MCG/ACT IN AERO: 2.0000 | INHALATION_SPRAY | Freq: Two times a day (BID) | RESPIRATORY_TRACT | 3 refills | Status: DC

## 2017-11-14 MED ORDER — HYDROCHLOROTHIAZIDE 12.5 MG PO CAPS: 12.5000 mg | ORAL_CAPSULE | Freq: Every day | ORAL | 3 refills | Status: DC

## 2017-11-14 MED ORDER — ALPRAZOLAM 0.5 MG PO TABS: ORAL_TABLET | ORAL | 1 refills | Status: DC

## 2017-11-14 MED ORDER — METFORMIN HCL 500 MG PO TABS: ORAL_TABLET | ORAL | 1 refills | Status: DC

## 2017-11-14 MED ORDER — FENOFIBRATE 160 MG PO TABS: 160.0000 mg | ORAL_TABLET | Freq: Every day | ORAL | 3 refills | Status: DC

## 2017-11-14 MED ORDER — LOSARTAN POTASSIUM 25 MG PO TABS: 25.0000 mg | ORAL_TABLET | Freq: Every day | ORAL | 1 refills | Status: DC
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
## 2017-11-14 NOTE — Patient Instructions (Signed)
Steps to Quit Smoking Smoking tobacco can be bad for your health. It can also affect almost every organ in your body. Smoking puts you and people around you at risk for many serious long-lasting (chronic) diseases. Quitting smoking is hard, but it is one of the best things that you can do for your health. It is never too late to quit. What are the benefits of quitting smoking? When you quit smoking, you lower your risk for getting serious diseases and conditions. They can include:  Lung cancer or lung disease.  Heart disease.  Stroke.  Heart attack.  Not being able to have children (infertility).  Weak bones (osteoporosis) and broken bones (fractures).  If you have coughing, wheezing, and shortness of breath, those symptoms may get better when you quit. You may also get sick less often. If you are pregnant, quitting smoking can help to lower your chances of having a baby of low birth weight. What can I do to help me quit smoking? Talk with your doctor about what can help you quit smoking. Some things you can do (strategies) include:  Quitting smoking totally, instead of slowly cutting back how much you smoke over a period of time.  Going to in-person counseling. You are more likely to quit if you go to many counseling sessions.  Using resources and support systems, such as: ? Online chats with a counselor. ? Phone quitlines. ? Printed self-help materials. ? Support groups or group counseling. ? Text messaging programs. ? Mobile phone apps or applications.  Taking medicines. Some of these medicines may have nicotine in them. If you are pregnant or breastfeeding, do not take any medicines to quit smoking unless your doctor says it is okay. Talk with your doctor about counseling or other things that can help you.  Talk with your doctor about using more than one strategy at the same time, such as taking medicines while you are also going to in-person counseling. This can help make  quitting easier. What things can I do to make it easier to quit? Quitting smoking might feel very hard at first, but there is a lot that you can do to make it easier. Take these steps:  Talk to your family and friends. Ask them to support and encourage you.  Call phone quitlines, reach out to support groups, or work with a counselor.  Ask people who smoke to not smoke around you.  Avoid places that make you want (trigger) to smoke, such as: ? Bars. ? Parties. ? Smoke-break areas at work.  Spend time with people who do not smoke.  Lower the stress in your life. Stress can make you want to smoke. Try these things to help your stress: ? Getting regular exercise. ? Deep-breathing exercises. ? Yoga. ? Meditating. ? Doing a body scan. To do this, close your eyes, focus on one area of your body at a time from head to toe, and notice which parts of your body are tense. Try to relax the muscles in those areas.  Download or buy apps on your mobile phone or tablet that can help you stick to your quit plan. There are many free apps, such as QuitGuide from the CDC (Centers for Disease Control and Prevention). You can find more support from smokefree.gov and other websites.  This information is not intended to replace advice given to you by your health care provider. Make sure you discuss any questions you have with your health care provider. Document Released: 10/08/2009 Document   Revised: 08/09/2016 Document Reviewed: 04/28/2015 Elsevier Interactive Patient Education  2018 Elsevier Inc.  

## 2017-11-15 LAB — CMP14+EGFR
ALBUMIN: 4.2 g/dL (ref 3.5–4.7)
ALT: 6 IU/L (ref 0–32)
AST: 10 IU/L (ref 0–40)
Albumin/Globulin Ratio: 1.4 (ref 1.2–2.2)
Alkaline Phosphatase: 21 IU/L — ABNORMAL LOW (ref 39–117)
BUN / CREAT RATIO: 20 (ref 12–28)
BUN: 14 mg/dL (ref 8–27)
Bilirubin Total: <0.2 mg/dL (ref 0.0–1.2)
CALCIUM: 9.7 mg/dL (ref 8.7–10.3)
CO2: 26 mmol/L (ref 20–29)
CREATININE: 0.7 mg/dL (ref 0.57–1.00)
Chloride: 92 mmol/L — ABNORMAL LOW (ref 96–106)
GFR, EST AFRICAN AMERICAN: 95 mL/min/{1.73_m2} (ref >59)
GFR, EST NON AFRICAN AMERICAN: 82 mL/min/{1.73_m2} (ref >59)
GLUCOSE: 117 mg/dL — AB (ref 65–99)
Globulin, Total: 3.1 g/dL (ref 1.5–4.5)
Potassium: 4.8 mmol/L (ref 3.5–5.2)
Sodium: 138 mmol/L (ref 134–144)
TOTAL PROTEIN: 7.3 g/dL (ref 6.0–8.5)

## 2017-11-15 LAB — ANEMIA PROFILE B
BASOS ABS: 0 10*3/uL (ref 0.0–0.2)
Basos: 0 %
EOS (ABSOLUTE): 0.2 10*3/uL (ref 0.0–0.4)
EOS: 2 %
FOLATE: 7 ng/mL (ref >3.0)
Ferritin: 89 ng/mL (ref 15–150)
HEMOGLOBIN: 10.2 g/dL — AB (ref 11.1–15.9)
Hematocrit: 33.8 % — ABNORMAL LOW (ref 34.0–46.6)
IMMATURE GRANS (ABS): 0 10*3/uL (ref 0.0–0.1)
IMMATURE GRANULOCYTES: 0 %
IRON SATURATION: 9 % — AB (ref 15–55)
Iron: 27 ug/dL (ref 27–139)
LYMPHS: 29 %
Lymphocytes Absolute: 3.1 10*3/uL (ref 0.7–3.1)
MCH: 24.5 pg — ABNORMAL LOW (ref 26.6–33.0)
MCHC: 30.2 g/dL — AB (ref 31.5–35.7)
MCV: 81 fL (ref 79–97)
MONOS ABS: 0.8 10*3/uL (ref 0.1–0.9)
Monocytes: 7 %
Neutrophils Absolute: 6.6 10*3/uL (ref 1.4–7.0)
Neutrophils: 62 %
PLATELETS: 533 10*3/uL — AB (ref 150–379)
RBC: 4.16 x10E6/uL (ref 3.77–5.28)
RDW: 17.3 % — ABNORMAL HIGH (ref 12.3–15.4)
Retic Ct Pct: 1.3 % (ref 0.6–2.6)
TIBC: 294 ug/dL (ref 250–450)
UIBC: 267 ug/dL (ref 118–369)
Vitamin B-12: 225 pg/mL — ABNORMAL LOW (ref 232–1245)
WBC: 10.7 10*3/uL (ref 3.4–10.8)

## 2017-11-15 LAB — LIPID PANEL
Chol/HDL Ratio: 2.3 ratio (ref 0.0–4.4)
Cholesterol, Total: 114 mg/dL (ref 100–199)
HDL: 49 mg/dL (ref >39)
LDL CALC: 38 mg/dL (ref 0–99)
Triglycerides: 134 mg/dL (ref 0–149)
VLDL Cholesterol Cal: 27 mg/dL (ref 5–40)

## 2017-11-15 LAB — THYROID PANEL WITH TSH
Free Thyroxine Index: 1.9 (ref 1.2–4.9)
T3 UPTAKE RATIO: 24 % (ref 24–39)
T4 TOTAL: 7.9 ug/dL (ref 4.5–12.0)
TSH: 0.709 u[IU]/mL (ref 0.450–4.500)

## 2017-11-15 LAB — VITAMIN D 25 HYDROXY (VIT D DEFICIENCY, FRACTURES): Vit D, 25-Hydroxy: 26.2 ng/mL — ABNORMAL LOW (ref 30.0–100.0)

## 2017-11-17 ENCOUNTER — Ambulatory Visit (INDEPENDENT_AMBULATORY_CARE_PROVIDER_SITE_OTHER): Payer: Medicare Other | Admitting: *Deleted

## 2017-11-17 DIAGNOSIS — E538 Deficiency of other specified B group vitamins: Secondary | ICD-10-CM | POA: Diagnosis not present

## 2017-11-17 MED ORDER — CYANOCOBALAMIN 1000 MCG/ML IJ SOLN
1000.0000 ug | INTRAMUSCULAR | Status: AC
  Administered 2017-11-17: 1000 ug via INTRAMUSCULAR

## 2017-11-17 NOTE — Progress Notes (Signed)
Pt given B12 injection IM right deltoid and tolerated well.

## 2017-11-20 ENCOUNTER — Telehealth: Payer: Self-pay | Admitting: Internal Medicine

## 2017-11-20 NOTE — Telephone Encounter (Signed)
Left message for patient to call back  

## 2017-11-22 NOTE — Telephone Encounter (Signed)
Patient's husband reports that patient is having problems with constipation.  She will increase her Miralax to BID and increase the amount of fluid that she is drinking.  They will call back if this fails to help

## 2017-12-04 DIAGNOSIS — J441 Chronic obstructive pulmonary disease with (acute) exacerbation: Secondary | ICD-10-CM | POA: Diagnosis not present

## 2017-12-08 DIAGNOSIS — J449 Chronic obstructive pulmonary disease, unspecified: Secondary | ICD-10-CM | POA: Diagnosis not present

## 2017-12-21 ENCOUNTER — Other Ambulatory Visit: Payer: Self-pay | Admitting: Nurse Practitioner

## 2017-12-21 DIAGNOSIS — F411 Generalized anxiety disorder: Secondary | ICD-10-CM

## 2017-12-23 ENCOUNTER — Other Ambulatory Visit: Payer: Self-pay | Admitting: Nurse Practitioner

## 2017-12-23 DIAGNOSIS — F411 Generalized anxiety disorder: Secondary | ICD-10-CM

## 2018-01-02 DIAGNOSIS — J449 Chronic obstructive pulmonary disease, unspecified: Secondary | ICD-10-CM | POA: Diagnosis not present

## 2018-01-04 DIAGNOSIS — J441 Chronic obstructive pulmonary disease with (acute) exacerbation: Secondary | ICD-10-CM | POA: Diagnosis not present

## 2018-01-26 DIAGNOSIS — J449 Chronic obstructive pulmonary disease, unspecified: Secondary | ICD-10-CM | POA: Diagnosis not present

## 2018-01-30 ENCOUNTER — Other Ambulatory Visit: Payer: Self-pay | Admitting: Nurse Practitioner

## 2018-01-30 DIAGNOSIS — F411 Generalized anxiety disorder: Secondary | ICD-10-CM

## 2018-02-04 DIAGNOSIS — J441 Chronic obstructive pulmonary disease with (acute) exacerbation: Secondary | ICD-10-CM | POA: Diagnosis not present

## 2018-02-12 ENCOUNTER — Other Ambulatory Visit: Payer: Self-pay

## 2018-02-12 MED ORDER — ALENDRONATE SODIUM 70 MG PO TABS: ORAL_TABLET | ORAL | 0 refills | Status: DC

## 2018-02-15 ENCOUNTER — Encounter: Payer: Self-pay | Admitting: Nurse Practitioner

## 2018-02-15 ENCOUNTER — Ambulatory Visit (INDEPENDENT_AMBULATORY_CARE_PROVIDER_SITE_OTHER): Payer: Medicare Other | Admitting: Nurse Practitioner

## 2018-02-15 VITALS — BP 130/73 | HR 101 | Temp 97.4°F | Ht 64.0 in | Wt 102.0 lb

## 2018-02-15 DIAGNOSIS — E119 Type 2 diabetes mellitus without complications: Secondary | ICD-10-CM | POA: Diagnosis not present

## 2018-02-15 DIAGNOSIS — E559 Vitamin D deficiency, unspecified: Secondary | ICD-10-CM | POA: Diagnosis not present

## 2018-02-15 DIAGNOSIS — E782 Mixed hyperlipidemia: Secondary | ICD-10-CM

## 2018-02-15 DIAGNOSIS — I10 Essential (primary) hypertension: Secondary | ICD-10-CM

## 2018-02-15 DIAGNOSIS — E785 Hyperlipidemia, unspecified: Secondary | ICD-10-CM | POA: Diagnosis not present

## 2018-02-15 DIAGNOSIS — M810 Age-related osteoporosis without current pathological fracture: Secondary | ICD-10-CM | POA: Diagnosis not present

## 2018-02-15 DIAGNOSIS — F411 Generalized anxiety disorder: Secondary | ICD-10-CM | POA: Diagnosis not present

## 2018-02-15 DIAGNOSIS — J449 Chronic obstructive pulmonary disease, unspecified: Secondary | ICD-10-CM | POA: Diagnosis not present

## 2018-02-15 LAB — BAYER DCA HB A1C WAIVED: HB A1C (BAYER DCA - WAIVED): 5.7 % (ref <7.0)

## 2018-02-15 MED ORDER — METFORMIN HCL 500 MG PO TABS: ORAL_TABLET | ORAL | 1 refills | Status: DC

## 2018-02-15 MED ORDER — ATORVASTATIN CALCIUM 40 MG PO TABS: 40.0000 mg | ORAL_TABLET | Freq: Every day | ORAL | 1 refills | Status: DC

## 2018-02-15 MED ORDER — BUDESONIDE-FORMOTEROL FUMARATE 160-4.5 MCG/ACT IN AERO: 2.0000 | INHALATION_SPRAY | Freq: Two times a day (BID) | RESPIRATORY_TRACT | 3 refills | Status: DC

## 2018-02-15 MED ORDER — FENOFIBRATE 160 MG PO TABS: 160.0000 mg | ORAL_TABLET | Freq: Every day | ORAL | 1 refills | Status: DC

## 2018-02-15 MED ORDER — CITALOPRAM HYDROBROMIDE 20 MG PO TABS
20.0000 mg | ORAL_TABLET | Freq: Every day | ORAL | 1 refills | Status: DC
Start: 2018-02-15 — End: 2018-07-10

## 2018-02-15 MED ORDER — AMLODIPINE BESYLATE 10 MG PO TABS: 10.0000 mg | ORAL_TABLET | Freq: Every day | ORAL | 1 refills | Status: DC

## 2018-02-15 MED ORDER — ALENDRONATE SODIUM 70 MG PO TABS: ORAL_TABLET | ORAL | 1 refills | Status: DC

## 2018-02-15 MED ORDER — HYDROCHLOROTHIAZIDE 12.5 MG PO CAPS: 12.5000 mg | ORAL_CAPSULE | Freq: Every day | ORAL | 1 refills | Status: DC

## 2018-02-15 MED ORDER — LOSARTAN POTASSIUM 25 MG PO TABS: 25.0000 mg | ORAL_TABLET | Freq: Every day | ORAL | 1 refills | Status: DC

## 2018-02-15 MED ORDER — ALPRAZOLAM 0.5 MG PO TABS: ORAL_TABLET | ORAL | 2 refills | Status: DC
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
## 2018-02-15 NOTE — Patient Instructions (Signed)
Protein Supplement Powders What are protein supplement powders? Protein is one of the major components of the human diet. It helps your body to build tissue, muscle, red blood cells, enzymes, antibodies, and hormones. Athletes need to include more protein in their diets than nonathletes do. Athletes place more stress on their muscles, and extra protein helps to repair these muscles. In female athletes, extra protein also helps to maintain regular menstrual cycles. Most of an athlete's daily protein needs can be met through food. However, protein supplement powders are a safe and effective way to increase daily protein intake, particularly if you:  Eat a vegan diet.  Are actively trying to build muscle mass.  Are growing.  Are recovering from injury.  Protein supplement powders are supplements that contain large amounts of protein. They can be easily digested, absorbed, and used by the body. These proteins contain the essential building blocks (amino acids) that are central to muscle function, repair, and recovery. Protein supplement powders most often consist of:  Animal-based proteins, including: ? Whey. ? Casein. ? Milk. ? Egg.  Vegetable-based proteins, including soy.  How do I use protein supplement powders? Many protein supplement powders need to be mixed with water. Some products come premixed in a liquid form. Specific proportions and directions vary by product. Follow the directions that are provided on the label. Eat carbohydrates and protein, either in food or supplement form, before and after an exercise session. Consuming a protein supplement powder before lifting weights or doing resistance training helps to build muscles (protein synthesis) and may limit muscle damage during exercise. Eating a protein supplement powder within three hours after exercise can maximize muscle protein synthesis. Before and after a workout, eat protein in a ratio of one protein for every three  carbohydrates. For example, one serving of protein powder that contains 20 grams of protein should be consumed with 60 grams of carbohydrates. This is equivalent to one banana and one piece of toast. How much protein supplement powder should I take? Follow the instructions on the product label for recommended serving sizes and daily servings. Protein supplement powders should be taken in addition to, not in place of, a balanced diet to meet daily protein needs. Daily protein recommendations for adults are as follows:  Average adults should consume around 0.36 grams of protein daily per pound of body weight. For example, a 150-pound adult should consume approximately 54 grams of protein each day.  Athletes should consume 0.45-0.73 grams of protein daily per pound of body weight. For example, a 150-pound athlete should consume 67.5-109.5 grams of protein each day.  What are the risks of taking protein supplement powders? The risks of taking a protein supplement powder may vary depending on the type of protein in the supplement. Risks may include:  Allergies. ? If you have an allergy to cow's milk, avoid protein supplements that contain milk, whey, or casein. ? If you have an allergy to soy, avoid protein supplements that contain soy or lecithin.  Extra stress on your kidneys, if you have kidney disease or diabetes.  If you eat too much protein, or if your protein intake is not balanced with carbohydrates and other nutrients, you may have:  Gastrointestinal discomfort.  Constipation.  Diarrhea.  Tissue dehydration.  Nausea.  Headache.  Fatigue.  What are some tips for using protein supplement powders?  Always talk with your health care provider before starting protein supplement powders.  Eat a balanced diet of fruits, vegetables, meat, dairy, and grains,  in addition to taking a protein supplement powder.  Drink enough fluid to keep your urine clear or pale yellow. Drink fluids  before, during, and after exercise and throughout the day.  Take protein supplements only as directed. Do not take a higher dose than was recommended for you. Protein supplement powders are not regulated. Some products may contain impurities or ingredients that differ from those on the label.  Try to meet your daily protein needs with food. Eat protein-rich foods along with protein supplement powders. Foods high in protein include ? Lean meat, poultry, and fish. ? Eggs. ? Milk, yogurt, and cheese. ? Soybeans and tofu. ? Beans and lentils. This information is not intended to replace advice given to you by your health care provider. Make sure you discuss any questions you have with your health care provider. Document Released: 12/12/2005 Document Revised: 11/08/2016 Document Reviewed: 05/27/2014 Elsevier Interactive Patient Education  2017 [employer].

## 2018-02-16 LAB — LIPID PANEL
CHOLESTEROL TOTAL: 72 mg/dL — AB (ref 100–199)
Chol/HDL Ratio: 1.9 ratio (ref 0.0–4.4)
HDL: 37 mg/dL — ABNORMAL LOW (ref >39)
LDL Calculated: 18 mg/dL (ref 0–99)
TRIGLYCERIDES: 84 mg/dL (ref 0–149)
VLDL Cholesterol Cal: 17 mg/dL (ref 5–40)

## 2018-02-16 LAB — CMP14+EGFR
ALBUMIN: 3.5 g/dL (ref 3.5–4.7)
ALK PHOS: 37 IU/L — AB (ref 39–117)
ALT: 5 IU/L (ref 0–32)
AST: 10 IU/L (ref 0–40)
Albumin/Globulin Ratio: 1 — ABNORMAL LOW (ref 1.2–2.2)
BILIRUBIN TOTAL: 0.2 mg/dL (ref 0.0–1.2)
BUN / CREAT RATIO: 24 (ref 12–28)
BUN: 15 mg/dL (ref 8–27)
CHLORIDE: 91 mmol/L — AB (ref 96–106)
CO2: 25 mmol/L (ref 20–29)
Calcium: 9.5 mg/dL (ref 8.7–10.3)
Creatinine, Ser: 0.62 mg/dL (ref 0.57–1.00)
GFR calc Af Amer: 98 mL/min/{1.73_m2} (ref >59)
GFR calc non Af Amer: 85 mL/min/{1.73_m2} (ref >59)
GLOBULIN, TOTAL: 3.4 g/dL (ref 1.5–4.5)
GLUCOSE: 77 mg/dL (ref 65–99)
Potassium: 4.1 mmol/L (ref 3.5–5.2)
SODIUM: 139 mmol/L (ref 134–144)
Total Protein: 6.9 g/dL (ref 6.0–8.5)

## 2018-03-04 DIAGNOSIS — J441 Chronic obstructive pulmonary disease with (acute) exacerbation: Secondary | ICD-10-CM | POA: Diagnosis not present

## 2018-03-05 DIAGNOSIS — J449 Chronic obstructive pulmonary disease, unspecified: Secondary | ICD-10-CM | POA: Diagnosis not present

## 2018-03-27 ENCOUNTER — Other Ambulatory Visit: Payer: Self-pay | Admitting: Nurse Practitioner

## 2018-03-27 DIAGNOSIS — F411 Generalized anxiety disorder: Secondary | ICD-10-CM

## 2018-03-28 ENCOUNTER — Other Ambulatory Visit: Payer: Self-pay | Admitting: Nurse Practitioner

## 2018-03-28 DIAGNOSIS — F411 Generalized anxiety disorder: Secondary | ICD-10-CM

## 2018-04-01 ENCOUNTER — Emergency Department (HOSPITAL_COMMUNITY): Payer: Medicare Other

## 2018-04-01 ENCOUNTER — Encounter (HOSPITAL_COMMUNITY): Payer: Self-pay | Admitting: Emergency Medicine

## 2018-04-01 ENCOUNTER — Other Ambulatory Visit: Payer: Self-pay

## 2018-04-01 ENCOUNTER — Inpatient Hospital Stay (HOSPITAL_COMMUNITY)
Admission: EM | Admit: 2018-04-01 | Discharge: 2018-04-08 | DRG: 190 | Disposition: A | Discharge status: Home / Self Care | Payer: Medicare Other | Attending: Internal Medicine | Admitting: Internal Medicine

## 2018-04-01 DIAGNOSIS — J9620 Acute and chronic respiratory failure, unspecified whether with hypoxia or hypercapnia: Secondary | ICD-10-CM | POA: Diagnosis not present

## 2018-04-01 DIAGNOSIS — E222 Syndrome of inappropriate secretion of antidiuretic hormone: Secondary | ICD-10-CM | POA: Diagnosis not present

## 2018-04-01 DIAGNOSIS — Z9842 Cataract extraction status, left eye: Secondary | ICD-10-CM | POA: Diagnosis not present

## 2018-04-01 DIAGNOSIS — B9620 Unspecified Escherichia coli [E. coli] as the cause of diseases classified elsewhere: Secondary | ICD-10-CM | POA: Diagnosis present

## 2018-04-01 DIAGNOSIS — N390 Urinary tract infection, site not specified: Secondary | ICD-10-CM | POA: Diagnosis not present

## 2018-04-01 DIAGNOSIS — F17210 Nicotine dependence, cigarettes, uncomplicated: Secondary | ICD-10-CM | POA: Diagnosis present

## 2018-04-01 DIAGNOSIS — J441 Chronic obstructive pulmonary disease with (acute) exacerbation: Secondary | ICD-10-CM | POA: Diagnosis not present

## 2018-04-01 DIAGNOSIS — R0602 Shortness of breath: Secondary | ICD-10-CM | POA: Diagnosis not present

## 2018-04-01 DIAGNOSIS — E119 Type 2 diabetes mellitus without complications: Secondary | ICD-10-CM | POA: Diagnosis not present

## 2018-04-01 DIAGNOSIS — D649 Anemia, unspecified: Secondary | ICD-10-CM | POA: Diagnosis present

## 2018-04-01 DIAGNOSIS — Z9012 Acquired absence of left breast and nipple: Secondary | ICD-10-CM

## 2018-04-01 DIAGNOSIS — R05 Cough: Secondary | ICD-10-CM | POA: Diagnosis not present

## 2018-04-01 DIAGNOSIS — I10 Essential (primary) hypertension: Secondary | ICD-10-CM | POA: Diagnosis not present

## 2018-04-01 DIAGNOSIS — R531 Weakness: Secondary | ICD-10-CM | POA: Diagnosis not present

## 2018-04-01 DIAGNOSIS — Z79899 Other long term (current) drug therapy: Secondary | ICD-10-CM

## 2018-04-01 DIAGNOSIS — Z9841 Cataract extraction status, right eye: Secondary | ICD-10-CM

## 2018-04-01 DIAGNOSIS — E43 Unspecified severe protein-calorie malnutrition: Secondary | ICD-10-CM | POA: Diagnosis not present

## 2018-04-01 DIAGNOSIS — Z90710 Acquired absence of both cervix and uterus: Secondary | ICD-10-CM

## 2018-04-01 DIAGNOSIS — Z681 Body mass index (BMI) 19 or less, adult: Secondary | ICD-10-CM | POA: Diagnosis not present

## 2018-04-01 DIAGNOSIS — Z7983 Long term (current) use of bisphosphonates: Secondary | ICD-10-CM | POA: Diagnosis not present

## 2018-04-01 DIAGNOSIS — K5900 Constipation, unspecified: Secondary | ICD-10-CM | POA: Diagnosis not present

## 2018-04-01 DIAGNOSIS — E785 Hyperlipidemia, unspecified: Secondary | ICD-10-CM | POA: Diagnosis not present

## 2018-04-01 DIAGNOSIS — Z9981 Dependence on supplemental oxygen: Secondary | ICD-10-CM

## 2018-04-01 DIAGNOSIS — E875 Hyperkalemia: Secondary | ICD-10-CM | POA: Diagnosis not present

## 2018-04-01 DIAGNOSIS — Z7951 Long term (current) use of inhaled steroids: Secondary | ICD-10-CM | POA: Diagnosis not present

## 2018-04-01 DIAGNOSIS — E871 Hypo-osmolality and hyponatremia: Secondary | ICD-10-CM | POA: Diagnosis not present

## 2018-04-01 DIAGNOSIS — Z85828 Personal history of other malignant neoplasm of skin: Secondary | ICD-10-CM | POA: Diagnosis not present

## 2018-04-01 LAB — COMPREHENSIVE METABOLIC PANEL
ALBUMIN: 3.5 g/dL (ref 3.5–5.0)
ALK PHOS: 25 U/L — AB (ref 38–126)
ALT: 10 U/L — AB (ref 14–54)
AST: 20 U/L (ref 15–41)
Anion gap: 14 (ref 5–15)
BILIRUBIN TOTAL: 0.4 mg/dL (ref 0.3–1.2)
BUN: 11 mg/dL (ref 6–20)
CALCIUM: 9.6 mg/dL (ref 8.9–10.3)
CO2: 30 mmol/L (ref 22–32)
CREATININE: 0.45 mg/dL (ref 0.44–1.00)
Chloride: 86 mmol/L — ABNORMAL LOW (ref 101–111)
GFR calc Af Amer: >60 mL/min (ref >60)
GFR calc non Af Amer: >60 mL/min (ref >60)
GLUCOSE: 81 mg/dL (ref 65–99)
POTASSIUM: 4.1 mmol/L (ref 3.5–5.1)
Sodium: 130 mmol/L — ABNORMAL LOW (ref 135–145)
TOTAL PROTEIN: 7.4 g/dL (ref 6.5–8.1)

## 2018-04-01 LAB — CBC WITH DIFFERENTIAL/PLATELET
BASOS ABS: 0 10*3/uL (ref 0.0–0.1)
BASOS PCT: 0 %
Eosinophils Absolute: 0.1 10*3/uL (ref 0.0–0.7)
Eosinophils Relative: 1 %
HEMATOCRIT: 31.3 % — AB (ref 36.0–46.0)
HEMOGLOBIN: 9.9 g/dL — AB (ref 12.0–15.0)
LYMPHS PCT: 25 %
Lymphs Abs: 2.5 10*3/uL (ref 0.7–4.0)
MCH: 25.7 pg — ABNORMAL LOW (ref 26.0–34.0)
MCHC: 31.6 g/dL (ref 30.0–36.0)
MCV: 81.3 fL (ref 78.0–100.0)
Monocytes Absolute: 0.8 10*3/uL (ref 0.1–1.0)
Monocytes Relative: 8 %
NEUTROS ABS: 6.4 10*3/uL (ref 1.7–7.7)
NEUTROS PCT: 66 %
Platelets: 476 10*3/uL — ABNORMAL HIGH (ref 150–400)
RBC: 3.85 MIL/uL — ABNORMAL LOW (ref 3.87–5.11)
RDW: 17.2 % — ABNORMAL HIGH (ref 11.5–15.5)
WBC: 9.8 10*3/uL (ref 4.0–10.5)

## 2018-04-01 LAB — URINALYSIS, ROUTINE W REFLEX MICROSCOPIC
BILIRUBIN URINE: NEGATIVE
Glucose, UA: NEGATIVE mg/dL
Ketones, ur: NEGATIVE mg/dL
Leukocytes, UA: NEGATIVE
Nitrite: NEGATIVE
Protein, ur: 100 mg/dL — AB
SPECIFIC GRAVITY, URINE: 1.011 (ref 1.005–1.030)
SQUAMOUS EPITHELIAL / LPF: NONE SEEN
pH: 5 (ref 5.0–8.0)

## 2018-04-01 LAB — RETICULOCYTES
RBC.: 3.67 MIL/uL — AB (ref 3.87–5.11)
RETIC CT PCT: 1.3 % (ref 0.4–3.1)
Retic Count, Absolute: 47.7 10*3/uL (ref 19.0–186.0)

## 2018-04-01 LAB — TROPONIN I

## 2018-04-01 LAB — GLUCOSE, CAPILLARY: GLUCOSE-CAPILLARY: 207 mg/dL — AB (ref 65–99)

## 2018-04-01 MED ORDER — ALPRAZOLAM 0.5 MG PO TABS
0.5000 mg | ORAL_TABLET | Freq: Two times a day (BID) | ORAL | Status: DC | PRN
  Administered 2018-04-01 – 2018-04-08 (×13): 0.5 mg via ORAL
  Filled 2018-04-01 (×13): qty 1

## 2018-04-01 MED ORDER — ATORVASTATIN CALCIUM 40 MG PO TABS
40.0000 mg | ORAL_TABLET | Freq: Every day | ORAL | Status: DC
  Administered 2018-04-01 – 2018-04-08 (×8): 40 mg via ORAL
  Filled 2018-04-01 (×8): qty 1

## 2018-04-01 MED ORDER — LOSARTAN POTASSIUM 50 MG PO TABS
25.0000 mg | ORAL_TABLET | Freq: Every day | ORAL | Status: DC
  Administered 2018-04-02 – 2018-04-04 (×3): 25 mg via ORAL
  Filled 2018-04-01 (×3): qty 1

## 2018-04-01 MED ORDER — METHYLPREDNISOLONE SODIUM SUCC 40 MG IJ SOLR
40.0000 mg | Freq: Two times a day (BID) | INTRAMUSCULAR | Status: DC
  Administered 2018-04-01 – 2018-04-03 (×4): 40 mg via INTRAVENOUS
  Filled 2018-04-01 (×4): qty 1

## 2018-04-01 MED ORDER — SODIUM CHLORIDE 0.9 % IV SOLN
1.0000 g | Freq: Once | INTRAVENOUS | Status: AC
  Administered 2018-04-01: 1 g via INTRAVENOUS
  Filled 2018-04-01: qty 10

## 2018-04-01 MED ORDER — SODIUM CHLORIDE 0.9 % IV SOLN
1.0000 g | INTRAVENOUS | Status: DC
  Administered 2018-04-02 – 2018-04-05 (×4): 1 g via INTRAVENOUS
  Filled 2018-04-01 (×4): qty 1

## 2018-04-01 MED ORDER — ALBUTEROL SULFATE (2.5 MG/3ML) 0.083% IN NEBU
INHALATION_SOLUTION | RESPIRATORY_TRACT | Status: AC
  Administered 2018-04-01: 2.5 mg
  Filled 2018-04-01: qty 3

## 2018-04-01 MED ORDER — IPRATROPIUM-ALBUTEROL 0.5-2.5 (3) MG/3ML IN SOLN
3.0000 mL | Freq: Four times a day (QID) | RESPIRATORY_TRACT | Status: DC
  Administered 2018-04-01 – 2018-04-02 (×2): 3 mL via RESPIRATORY_TRACT
  Filled 2018-04-01 (×2): qty 3

## 2018-04-01 MED ORDER — PREDNISONE 20 MG PO TABS
40.0000 mg | ORAL_TABLET | Freq: Once | ORAL | Status: AC
  Administered 2018-04-01: 40 mg via ORAL
  Filled 2018-04-01: qty 2

## 2018-04-01 MED ORDER — MOMETASONE FURO-FORMOTEROL FUM 200-5 MCG/ACT IN AERO
2.0000 | INHALATION_SPRAY | Freq: Two times a day (BID) | RESPIRATORY_TRACT | Status: DC
  Administered 2018-04-02 – 2018-04-08 (×13): 2 via RESPIRATORY_TRACT
  Filled 2018-04-01 (×2): qty 8.8

## 2018-04-01 MED ORDER — IPRATROPIUM-ALBUTEROL 0.5-2.5 (3) MG/3ML IN SOLN
3.0000 mL | Freq: Once | RESPIRATORY_TRACT | Status: AC
  Administered 2018-04-01: 3 mL via RESPIRATORY_TRACT
  Filled 2018-04-01: qty 3

## 2018-04-01 MED ORDER — CITALOPRAM HYDROBROMIDE 20 MG PO TABS
20.0000 mg | ORAL_TABLET | Freq: Every day | ORAL | Status: DC
  Administered 2018-04-02 – 2018-04-08 (×7): 20 mg via ORAL
  Filled 2018-04-01 (×7): qty 1

## 2018-04-01 MED ORDER — METHYLPREDNISOLONE SODIUM SUCC 40 MG IJ SOLR: 40.0000 mg | Freq: Two times a day (BID) | INTRAMUSCULAR | Status: DC

## 2018-04-01 MED ORDER — ALBUTEROL (5 MG/ML) CONTINUOUS INHALATION SOLN
10.0000 mg/h | INHALATION_SOLUTION | RESPIRATORY_TRACT | Status: DC
  Administered 2018-04-01: 10 mg/h via RESPIRATORY_TRACT
  Filled 2018-04-01: qty 20

## 2018-04-01 MED ORDER — AMLODIPINE BESYLATE 5 MG PO TABS
10.0000 mg | ORAL_TABLET | Freq: Every day | ORAL | Status: DC
  Administered 2018-04-01 – 2018-04-08 (×8): 10 mg via ORAL
  Filled 2018-04-01 (×8): qty 2

## 2018-04-01 MED ORDER — ENSURE ENLIVE PO LIQD
237.0000 mL | Freq: Two times a day (BID) | ORAL | Status: DC
  Administered 2018-04-02 – 2018-04-08 (×12): 237 mL via ORAL

## 2018-04-01 MED ORDER — HYDROCHLOROTHIAZIDE 12.5 MG PO CAPS: 12.5000 mg | ORAL_CAPSULE | Freq: Every day | ORAL | Status: DC

## 2018-04-01 MED ORDER — INSULIN ASPART 100 UNIT/ML ~~LOC~~ SOLN: 0.0000 [IU] | Freq: Three times a day (TID) | SUBCUTANEOUS | Status: DC

## 2018-04-01 MED ORDER — FENOFIBRATE 160 MG PO TABS
160.0000 mg | ORAL_TABLET | Freq: Every day | ORAL | Status: DC
  Administered 2018-04-02 – 2018-04-08 (×7): 160 mg via ORAL
  Filled 2018-04-01 (×7): qty 1

## 2018-04-01 MED ORDER — SODIUM CHLORIDE 0.9 % IV SOLN
INTRAVENOUS | Status: DC
  Administered 2018-04-01: 13:00:00 via INTRAVENOUS

## 2018-04-01 MED ORDER — SODIUM CHLORIDE 0.9 % IV SOLN
INTRAVENOUS | Status: AC
  Administered 2018-04-01 – 2018-04-02 (×2): via INTRAVENOUS

## 2018-04-01 NOTE — ED Notes (Signed)
Family at bedside. 

## 2018-04-01 NOTE — ED Notes (Signed)
Call to daughter, Nelly Rout [phone]

## 2018-04-01 NOTE — ED Notes (Signed)
Report to [facility]

## 2018-04-01 NOTE — ED Notes (Signed)
Pt in by POV after visit from daughter who request eval for weakness Pt is 5 yr post R breast Ca, on home O2/2L, yet continues to smoke 1/2 PPD She lives alone with spouse Pt reports weakness with decreased appetite, with food "running thru" her She appears thin and frail yet has large veins  She has a wet cough  Met by EDP while being triaged

## 2018-04-01 NOTE — ED Notes (Signed)
hospitalist at bedside

## 2018-04-01 NOTE — ED Provider Notes (Signed)
[facility] Provider Note   CSN: [id number] Arrival date & time: 04/01/18  1200     History   Chief Complaint Chief Complaint  Patient presents with  . Weakness    HPI Jacqueline Cervantes is a 83 y.o. female.  HPI  83 year old female, she has a known history of breast cancer on the left status post mastectomy as well as a history of COPD due to chronic tobacco use (still smoking half to 1 pack/day, on 2 L nasal cannula.)  The patient complains of a chief complaint of shortness of breath  According to the patient and her daughter who is also an additional historian the patient has had approximately 1-2 weeks of gradual downturn with both generalized weakness as well as increasing shortness of breath, increasing fatigue, increasing cough and now requiring more oxygen at home.  There is been no fevers vomiting or diarrhea, there is been no blood in the stools and she denies abdominal pain or chest pain or swelling of the legs.  The daughter also reports that she has lost several pounds over the last week due to poor intake.  She noticed today that her mother could not get up and walk by herself but was requiring significant assistance.  The symptoms are persistent, gradually worsening and have now become severe.  Past Medical History:  Diagnosis Date  . Anxiety   . Arthritis    "fingers" (09/03/2015)  . Cancer of left breast ([place])   . COPD with acute exacerbation ([place])    Archie Endo 09/03/2015  . COPD with chronic bronchitis ([place])    Archie Endo 09/03/2015  . Depression   . GERD (gastroesophageal reflux disease)   . Hypercholesteremia   . Hypertension   . On home oxygen therapy    "I use my husband's oxygen 2-3 times/day; 3L" (09/03/2015)  . Prolapsed internal hemorrhoids, grade 3 05/03/2016  . Skin cancer    "face/nose"  . Type II diabetes mellitus [facility])     Patient Active Problem List   Diagnosis Date Noted  . Vitamin D deficiency 11/14/2017  . Anal fissure  10/31/2017  . COPD, severe ([place]) 05/31/2016  . Prolapsed internal hemorrhoids, grade 3 05/03/2016  . Generalized anxiety disorder 07/11/2014  . Tobacco abuse, in remission 08/02/2013  . HTN (hypertension) 05/02/2013  . HLD (hyperlipidemia) 05/02/2013  . DM (diabetes mellitus) ([place]) 05/02/2013  . Osteoporosis 05/02/2013    Past Surgical History:  Procedure Laterality Date  . ABDOMINAL HYSTERECTOMY    . BREAST BIOPSY Left   . CATARACT EXTRACTION, BILATERAL Bilateral   . DILATION AND CURETTAGE OF UTERUS    . FLEXIBLE SIGMOIDOSCOPY  2014   [facility]  . MASTECTOMY Left 05/11/2006  . SKIN CANCER EXCISION     "cut it off my nose; went right down the middle, on top"     OB History    Gravida  1   Para  1   Term  1   Preterm      AB      Living  1     SAB      TAB      Ectopic      Multiple      Live Births               Home Medications    Prior to Admission medications   Medication Sig Start Date End Date Taking? Authorizing Provider  acetaminophen (TYLENOL) 500 MG tablet Take 500 mg  by mouth every 6 (six) hours as needed for mild pain.   Yes [provider]  albuterol (PROVENTIL HFA;VENTOLIN HFA) 108 (90 Base) MCG/ACT inhaler Inhale 2 puffs into the lungs every 6 (six) hours as needed for wheezing or shortness of breath. 11/29/16  Yes Hassell Done, Mary-Margaret, FNP  alendronate (FOSAMAX) 70 MG tablet TAKE 1 TABLET BY MOUTH EVERY WEEK WITH A FULL GLASS OF WATER ON AN EMPTY STOMACH ON FRIDAYS 02/15/18  Yes Hassell Done, Mary-Margaret, FNP  ALPRAZolam Duanne Moron) 0.5 MG tablet TAKE 1 TABLET BY MOUTH 2 TIMES DAILY AS NEEDED 02/15/18  Yes Hassell Done, Mary-Margaret, FNP  AMBULATORY NON FORMULARY MEDICATION 2% Diltiazem gel mixed with 5% Lidocaine, apply to rectum three times a day 10/25/17  Yes Gatha Mayer, MD  amLODipine (NORVASC) 10 MG tablet Take 1 tablet (10 mg total) by mouth daily. 02/15/18  Yes Martin, Mary-Margaret, FNP  atorvastatin (LIPITOR) 40 MG tablet  Take 1 tablet (40 mg total) by mouth daily. 02/15/18  Yes Hassell Done, Mary-Margaret, FNP  budesonide-formoterol (SYMBICORT) 160-4.5 MCG/ACT inhaler Inhale 2 puffs into the lungs 2 (two) times daily. 02/15/18  Yes Martin, Mary-Margaret, FNP  citalopram (CELEXA) 20 MG tablet Take 1 tablet (20 mg total) by mouth daily. 02/15/18  Yes Martin, Mary-Margaret, FNP  fenofibrate 160 MG tablet Take 1 tablet (160 mg total) by mouth daily. 02/15/18  Yes Martin, Mary-Margaret, FNP  hydrochlorothiazide (MICROZIDE) 12.5 MG capsule Take 1 capsule (12.5 mg total) by mouth daily. 02/15/18  Yes Martin, Mary-Margaret, FNP  ipratropium-albuterol (DUONEB) 0.5-2.5 (3) MG/3ML SOLN Take 3 mLs by nebulization 4 (four) times daily as needed. Dx: J44.9 04/20/17  Yes Brand Males, MD  losartan (COZAAR) 25 MG tablet Take 1 tablet (25 mg total) by mouth daily. 02/15/18  Yes Martin, Mary-Margaret, FNP  metFORMIN (GLUCOPHAGE) 500 MG tablet TAKE 2 TABLETS IN THE MORNING AND 1 TABLET IN THE EVENING WITH MEALS. 02/15/18  Yes Hassell Done, Mary-Margaret, FNP  Omega-3 Fatty Acids (FISH OIL PO) Take 1 capsule by mouth daily.   Yes [provider]  OXYGEN Inhale 2 L/min into the lungs.   Yes [provider]  polyethylene glycol (MIRALAX / GLYCOLAX) packet Take 17 g by mouth daily.   Yes [provider]    Family History Family History  Problem Relation Age of Onset  . Diabetes Mother   . Cancer Father        unknown  . Cancer Sister   . Diabetes Sister   . Cancer Brother   . Diabetes Brother   . Diabetes Brother   . Colon cancer Neg Hx   . Stomach cancer Neg Hx   . Esophageal cancer Neg Hx     Social History Social History   Tobacco Use  . Smoking status: Current Every Day Smoker    Packs/day: 0.25    Years: 52.00    Pack years: 13.00    Types: Cigarettes    Last attempt to quit: 05/10/2016    Years since quitting: 1.8  . Smokeless tobacco: Never Used  . Tobacco comment: Pt states she has been smoking  intermittently for 52 years.   Substance Use Topics  . Alcohol use: No    Alcohol/week: 0.0 oz  . Drug use: No     Allergies   Patient has no known allergies.   Review of Systems Review of Systems  All other systems reviewed and are negative.    Physical Exam Updated Vital Signs BP (!) 163/63   Pulse 94  Temp 98.7 F (37.1 C) (Oral)   Resp (!) 26   Ht 5\' 4"  (1.626 m)   Wt 52.2 kg (115 lb)   SpO2 98%   BMI 19.74 kg/m   Physical Exam  Constitutional: She appears well-developed and well-nourished. She appears distressed.  HENT:  Head: Normocephalic and atraumatic.  Mouth/Throat: Oropharynx is clear and moist. No oropharyngeal exudate.  Eyes: Pupils are equal, round, and reactive to light. Conjunctivae and EOM are normal. Right eye exhibits no discharge. Left eye exhibits no discharge. No scleral icterus.  Neck: Normal range of motion. Neck supple. No JVD present. No thyromegaly present.  Cardiovascular: Normal rate, regular rhythm, normal heart sounds and intact distal pulses. Exam reveals no gallop and no friction rub.  No murmur heard. Pulmonary/Chest: She is in respiratory distress. She has wheezes. She has rales.  Abdominal: Soft. Bowel sounds are normal. She exhibits no distension and no mass. There is no tenderness.  Musculoskeletal: Normal range of motion. She exhibits no edema or tenderness.  Lymphadenopathy:    She has no cervical adenopathy.  Neurological: She is alert. Coordination normal.  Patient is generally weak but is able to perform all of the commands that I asked her to including assisting in sitting up though she does require nursing assistance to do this  Skin: Skin is warm and dry. No rash noted. No erythema.  Psychiatric: She has a normal mood and affect. Her behavior is normal.  Nursing note and vitals reviewed.    ED Treatments / Results  Labs (all labs ordered are listed, but only abnormal results are displayed) Labs Reviewed  CBC WITH  DIFFERENTIAL/PLATELET - Abnormal; Notable for the following components:      Result Value   RBC 3.85 (*)    Hemoglobin 9.9 (*)    HCT 31.3 (*)    MCH 25.7 (*)    RDW 17.2 (*)    Platelets 476 (*)    All other components within normal limits  COMPREHENSIVE METABOLIC PANEL - Abnormal; Notable for the following components:   Sodium 130 (*)    Chloride 86 (*)    ALT 10 (*)    Alkaline Phosphatase 25 (*)    All other components within normal limits  URINALYSIS, ROUTINE W REFLEX MICROSCOPIC - Abnormal; Notable for the following components:   APPearance HAZY (*)    Hgb urine dipstick MODERATE (*)    Protein, ur 100 (*)    Bacteria, UA MANY (*)    All other components within normal limits  URINE CULTURE  TROPONIN I    EKG EKG Interpretation  Date/Time:  Sunday April 01 2018 12:12:07 EDT Ventricular Rate:  97 PR Interval:    QRS Duration: 86 QT Interval:  356 QTC Calculation: 453 R Axis:   62 Text Interpretation:  Normal sinus rhythm Left ventricular hypertrophy Nonspecific T abnrm, anterolateral leads since last tracing no significant change Confirmed by Noemi Chapel [id number]) on 04/01/2018 12:20:46 PM   Radiology Dg Chest Port 1 View  Result Date: 04/01/2018 CLINICAL DATA:  Cough and shortness of breath. EXAM: PORTABLE CHEST 1 VIEW COMPARISON:  July 09, 2017 FINDINGS: Prominent skin fold over the right lateral chest are identified. Lung markings are seen on both sides with no convincing evidence of pneumothorax. The heart, hila, and mediastinum are normal. No nodules or masses. No focal infiltrates. No other acute abnormalities. IMPRESSION: Prominent skin folds over the right lateral chest with no convincing evidence of pneumothorax. The patient may benefit from a  PA and lateral chest x-ray before discharge. No acute abnormality seen. Electronically Signed   By: Dorise Bullion III M.D   On: 04/01/2018 12:45    Procedures Procedures (including critical care time)  Medications  Ordered in ED Medications  albuterol (PROVENTIL,VENTOLIN) solution continuous neb (10 mg/hr Nebulization New Bag/Given 04/01/18 1234)  0.9 %  sodium chloride infusion ( Intravenous New Bag/Given 04/01/18 1239)  cefTRIAXone (ROCEPHIN) 1 g in sodium chloride 0.9 % 100 mL IVPB (has no administration in time range)  ipratropium-albuterol (DUONEB) 0.5-2.5 (3) MG/3ML nebulizer solution 3 mL (3 mLs Nebulization Given 04/01/18 1234)  predniSONE (DELTASONE) tablet 40 mg (40 mg Oral Given 04/01/18 1238)     Initial Impression / Assessment and Plan / ED Course  I have reviewed the triage vital signs and the nursing notes.  Pertinent labs & imaging results that were available during my care of the patient were reviewed by me and considered in my medical decision making (see chart for details).  Clinical Course as of Apr 01 1537  Nancy Fetter Apr 01, 2018  1518 CBC with Differential/Platelet(!) [BM]  6269 Ultimately the patient has a mild persistent anemia, she has a new hyponatremia with a sodium of 130 and I do not think this accounts for her generalized weakness a definitely contributes.  She has persistent reactive airway disease and is continuously wheezing, she has improved with nebs but still dyspneic and will need to be admitted to the hospital.   [BM]    Clinical Course User Index [BM] Noemi Chapel, MD   The patient's exam is concerning for worsening COPD and lung function, her oxygenation is okay on 3 L, will obtain a chest x-ray and labs.  Her EKG is unremarkable and shows a borderline tachycardia and what appears to be normal sinus rhythm.  She definitely is generally weak and needs further evaluation with labs, urinalysis and a chest x-ray.  We will get some IV fluids as well  Discussed with hospitalist Dr. Denton Brick - will admit  Final Clinical Impressions(s) / ED Diagnoses   Final diagnoses:  COPD exacerbation ([place])  Generalized weakness  Hyponatremia     Noemi Chapel, MD 04/01/18 1538

## 2018-04-01 NOTE — ED Notes (Signed)
Dr Sabra Heck in to discuss findings and reassess

## 2018-04-01 NOTE — H&P (Addendum)
History and Physical    Jacqueline Cervantes OAC:[id number] DOB: 1935/07/12 DOA: 04/01/2018  PCP: [name], FNP   Patient coming from: Home   Chief Complaint: SOB, Weakness  HPI: Jacqueline Cervantes is a 83 y.o. female with medical history significant for HTN, DM, HLD, current tobacco abuse, severe COPD on chronic 2 L O2 at home, left breast cancer hx. History is obtained mostly from chart review, and patient's daughter-in-law, as patient answers a few of my questions appropriately but cannot remember the symptoms or details of what brought her to the ED.  Patient was brought to the ED by family with complaints of generalized weakness, congested cough productive of whitish sputum, decreased appetite and shortness of breath of > 2 days Patient lives with spouse who per reports is as frail as she is.  Patient's daughter Fraser Din, checks patient once a week- mostly sunday, lives in [place].  Per patient's daughter patient (talked on the phone) has also been having loose stools for 2 days, with the above-mentioned symptoms.  Also patient reported some problem with passing urine.  Family also notes some decline in memory over the past month, and mild confusion today. Patient still smokes cigarettes- 1/3 PPD.  ED Course: Heart rate initially in the 90s up to 102 likely from bronchodilator treatment, tachypnea 231, O2 sats greater than 98% on 3L [place].  Chest x-ray-no acute abnormality seen, recommended repeat PA lateral chest x-ray before discharge.  UA-moderate hemoglobin many bacteria. WBC- 9.8, hemoglobin 9.9 from 11.  Na Low 130.  Urine cultures were drawn. Patient was started on normal saline at 150 cc/h, 1 g ceftriaxone given, prednisone 40 x 1 given.   Review of Systems: As per HPI otherwise 10 point review of systems negative.  Past Medical History:  Diagnosis Date  . Anxiety   . Arthritis    "fingers" (09/03/2015)  . Cancer of left breast ([place])   . COPD with acute exacerbation ([place])    Archie Endo 09/03/2015  . COPD with chronic bronchitis ([place])    Archie Endo 09/03/2015  . Depression   . GERD (gastroesophageal reflux disease)   . Hypercholesteremia   . Hypertension   . On home oxygen therapy    "I use my husband's oxygen 2-3 times/day; 3L" (09/03/2015)  . Prolapsed internal hemorrhoids, grade 3 05/03/2016  . Skin cancer    "face/nose"  . Type II diabetes mellitus ([place])     Past Surgical History:  Procedure Laterality Date  . ABDOMINAL HYSTERECTOMY    . BREAST BIOPSY Left   . CATARACT EXTRACTION, BILATERAL Bilateral   . DILATION AND CURETTAGE OF UTERUS    . FLEXIBLE SIGMOIDOSCOPY  2014   [facility]  . MASTECTOMY Left 05/11/2006  . SKIN CANCER EXCISION     "cut it off my nose; went right down the middle, on top"     reports that she has been smoking cigarettes.  She has a 13.00 pack-year smoking history. She has never used smokeless tobacco. She reports that she does not drink alcohol or use drugs.  No Known Allergies  Family History  Problem Relation Age of Onset  . Diabetes Mother   . Cancer Father        unknown  . Cancer Sister   . Diabetes Sister   . Cancer Brother   . Diabetes Brother   . Diabetes Brother   . Colon cancer Neg Hx   . Stomach cancer Neg Hx   . Esophageal cancer Neg  Hx     Prior to Admission medications   Medication Sig Start Date End Date Taking? Authorizing Provider  acetaminophen (TYLENOL) 500 MG tablet Take 500 mg by mouth every 6 (six) hours as needed for mild pain.   Yes [provider]  albuterol (PROVENTIL HFA;VENTOLIN HFA) 108 (90 Base) MCG/ACT inhaler Inhale 2 puffs into the lungs every 6 (six) hours as needed for wheezing or shortness of breath. 11/29/16  Yes Hassell Done, Mary-Margaret, FNP  alendronate (FOSAMAX) 70 MG tablet TAKE 1 TABLET BY MOUTH EVERY WEEK WITH A FULL GLASS OF WATER ON AN EMPTY STOMACH ON FRIDAYS 02/15/18  Yes Hassell Done, Mary-Margaret, FNP  ALPRAZolam Duanne Moron) 0.5 MG tablet TAKE 1 TABLET BY MOUTH 2 TIMES  DAILY AS NEEDED 02/15/18  Yes Hassell Done, Mary-Margaret, FNP  AMBULATORY NON FORMULARY MEDICATION 2% Diltiazem gel mixed with 5% Lidocaine, apply to rectum three times a day 10/25/17  Yes Gatha Mayer, MD  amLODipine (NORVASC) 10 MG tablet Take 1 tablet (10 mg total) by mouth daily. 02/15/18  Yes Martin, Mary-Margaret, FNP  atorvastatin (LIPITOR) 40 MG tablet Take 1 tablet (40 mg total) by mouth daily. 02/15/18  Yes Hassell Done, Mary-Margaret, FNP  budesonide-formoterol (SYMBICORT) 160-4.5 MCG/ACT inhaler Inhale 2 puffs into the lungs 2 (two) times daily. 02/15/18  Yes Martin, Mary-Margaret, FNP  citalopram (CELEXA) 20 MG tablet Take 1 tablet (20 mg total) by mouth daily. 02/15/18  Yes Martin, Mary-Margaret, FNP  fenofibrate 160 MG tablet Take 1 tablet (160 mg total) by mouth daily. 02/15/18  Yes Martin, Mary-Margaret, FNP  hydrochlorothiazide (MICROZIDE) 12.5 MG capsule Take 1 capsule (12.5 mg total) by mouth daily. 02/15/18  Yes Martin, Mary-Margaret, FNP  ipratropium-albuterol (DUONEB) 0.5-2.5 (3) MG/3ML SOLN Take 3 mLs by nebulization 4 (four) times daily as needed. Dx: J44.9 04/20/17  Yes Brand Males, MD  losartan (COZAAR) 25 MG tablet Take 1 tablet (25 mg total) by mouth daily. 02/15/18  Yes Martin, Mary-Margaret, FNP  metFORMIN (GLUCOPHAGE) 500 MG tablet TAKE 2 TABLETS IN THE MORNING AND 1 TABLET IN THE EVENING WITH MEALS. 02/15/18  Yes Hassell Done, Mary-Margaret, FNP  Omega-3 Fatty Acids (FISH OIL PO) Take 1 capsule by mouth daily.   Yes [provider]  OXYGEN Inhale 2 L/min into the lungs.   Yes [provider]  polyethylene glycol (MIRALAX / GLYCOLAX) packet Take 17 g by mouth daily.   Yes [provider]    Physical Exam: Vitals:   04/01/18 1430 04/01/18 1500 04/01/18 1530 04/01/18 1600  BP: (!) 147/65 (!) 163/63 (!) 169/67 (!) 151/65  Pulse:      Resp: (!) 26 (!) 26 (!) 21 (!) 29  Temp:      TempSrc:      SpO2:      Weight:      Height:        Constitutional: 3L  , calm, comfortable Vitals:   04/01/18 1430 04/01/18 1500 04/01/18 1530 04/01/18 1600  BP: (!) 147/65 (!) 163/63 (!) 169/67 (!) 151/65  Pulse:      Resp: (!) 26 (!) 26 (!) 21 (!) 29  Temp:      TempSrc:      SpO2:      Weight:      Height:       Eyes:  lids and conjunctivae normal ENMT: Mucous membranes are dry. Posterior pharynx clear of any exudate or lesions. Neck: normal, supple, no masses, no thyromegaly Respiratory: Diffuse inspiratory and expiratory wheezing normal respiratory effort. No accessory muscle use.  Cardiovascular: Regular rate and rhythm, no murmurs / rubs / gallops. No extremity edema. 2+ pedal pulses.  Abdomen: no tenderness, no masses palpated. No hepatosplenomegaly. Bowel sounds positive.  Musculoskeletal: no clubbing / cyanosis. No joint deformity upper and lower extremities. Good ROM, no contractures. Normal muscle tone.  Skin: no rashes, lesions, ulcers. No induration Neurologic: CN 2-12 grossly intact. Strength 5/5 in all 4.  Psychiatric: Alert and oriented x 3. Normal mood.   Labs on Admission: I have personally reviewed following labs and imaging studies  CBC: Recent Labs  Lab 04/01/18 1218  WBC 9.8  NEUTROABS 6.4  HGB 9.9*  HCT 31.3*  MCV 81.3  PLT 921*   Basic Metabolic Panel: Recent Labs  Lab 04/01/18 1218  NA 130*  K 4.1  CL 86*  CO2 30  GLUCOSE 81  BUN 11  CREATININE 0.45  CALCIUM 9.6   Liver Function Tests: Recent Labs  Lab 04/01/18 1218  AST 20  ALT 10*  ALKPHOS 25*  BILITOT 0.4  PROT 7.4  ALBUMIN 3.5   Cardiac Enzymes: Recent Labs  Lab 04/01/18 1218  TROPONINI <0.03   Urine analysis:    Component Value Date/Time   COLORURINE YELLOW 04/01/2018 1213   APPEARANCEUR HAZY (A) 04/01/2018 1213   LABSPEC 1.011 04/01/2018 1213   PHURINE 5.0 04/01/2018 1213   GLUCOSEU NEGATIVE 04/01/2018 1213   HGBUR MODERATE (A) 04/01/2018 1213   BILIRUBINUR NEGATIVE 04/01/2018 1213   KETONESUR NEGATIVE 04/01/2018 1213    PROTEINUR 100 (A) 04/01/2018 1213   UROBILINOGEN 1.0 09/04/2015 0240   NITRITE NEGATIVE 04/01/2018 1213   LEUKOCYTESUR NEGATIVE 04/01/2018 1213    Radiological Exams on Admission: Dg Chest Port 1 View  Result Date: 04/01/2018 CLINICAL DATA:  Cough and shortness of breath. EXAM: PORTABLE CHEST 1 VIEW COMPARISON:  July 09, 2017 FINDINGS: Prominent skin fold over the right lateral chest are identified. Lung markings are seen on both sides with no convincing evidence of pneumothorax. The heart, hila, and mediastinum are normal. No nodules or masses. No focal infiltrates. No other acute abnormalities. IMPRESSION: Prominent skin folds over the right lateral chest with no convincing evidence of pneumothorax. The patient may benefit from a PA and lateral chest x-ray before discharge. No acute abnormality seen. Electronically Signed   By: Dorise Bullion III M.D   On: 04/01/2018 12:45    EKG: Independently reviewed.   Assessment/Plan Principal Problem:   COPD, severe (HCC) Active Problems:   HTN (hypertension)   DM (diabetes mellitus) ([place])   COPD with acute exacerbation ([facility])   COPD Exacerbation-on chronic 2L O2.  Severe.  Productive cough, SOB, wheezing.  WBC 9.8.  Chest x-ray no acute abnormality, repeat recommended.  Current tobacco abuse.  Tachycardia likely from duo nebs, tachypnea likely from COPD exacerbation. -Repeat PA and lateral chest x-ray a.m. -IV ceftriaxone 1g daily - Iv solumedrol 40mg  BID -Scheduiled duonebs, as needed albuterol - PT eval -Mucolytics - Ns 100cc/hr X 12 hrs  UTI- symptomatic, with reported problems with urination, and Weakness. WBC- 9.8. -Continue IV ceftriaxone 1 g daily -Follow-up urine cultures drawn in ED  Anemia- Hgb 9.9, gradual decline down from baseline ~11.  - Scds - Anemia panel - CBC a.m  Diarrhea-2 days duration. Stabla Cr.  -Stool for C. Difficile - Hydrate  Hyponatremia- Na- 130. Baseline 135- 139.  Likely from poor p.o.  Intake. -Hydrate -BMP A.m  HTN- Systolic 194R- 740C. -Resume home Norvasc, lorsatan -Home HCTZ while hydrating  DM- CBG- 81. Hgba1c- 5.7  01/2018.  Possibly reflecting tight control in this 83 year old female. Patient on metformin at home -Doubt need for metformin, consider discontinuing on discharge -Fasting CBGs  DVT prophylaxis: SCds for now with 2 point drop in hgb Code Status: Full-family daughter- Fraser Din and son-in-law Family Communication: Daughter Electrical engineer, and Son in Sports coach - on the phone Disposition Plan: ~2 days Consults called: None Admission status: Obs, med surg   Bethena Roys MD [facility] Pager [phone] From 6PM-2AM.  Otherwise please contact night-coverage [email or web address] Password [facility]  04/01/2018, 4:39 PM

## 2018-04-01 NOTE — ED Notes (Signed)
Call to floor  [facility] RN will call back in five minutes after looking at it

## 2018-04-01 NOTE — ED Notes (Signed)
Rad at [facility]

## 2018-04-01 NOTE — ED Triage Notes (Signed)
Per pt family, pt has had progressive weakness and shortness of breath for last several days. Pt wears 2 liters home 02 all of the time.

## 2018-04-02 ENCOUNTER — Observation Stay (HOSPITAL_COMMUNITY): Payer: Medicare Other

## 2018-04-02 DIAGNOSIS — R0602 Shortness of breath: Secondary | ICD-10-CM | POA: Diagnosis not present

## 2018-04-02 DIAGNOSIS — I10 Essential (primary) hypertension: Secondary | ICD-10-CM | POA: Diagnosis not present

## 2018-04-02 DIAGNOSIS — E43 Unspecified severe protein-calorie malnutrition: Secondary | ICD-10-CM

## 2018-04-02 DIAGNOSIS — J441 Chronic obstructive pulmonary disease with (acute) exacerbation: Secondary | ICD-10-CM | POA: Diagnosis not present

## 2018-04-02 DIAGNOSIS — E089 Diabetes mellitus due to underlying condition without complications: Secondary | ICD-10-CM | POA: Diagnosis not present

## 2018-04-02 LAB — IRON AND TIBC
Iron: 28 ug/dL (ref 28–170)
Saturation Ratios: 9 % — ABNORMAL LOW (ref 10.4–31.8)
TIBC: 302 ug/dL (ref 250–450)
UIBC: 274 ug/dL

## 2018-04-02 LAB — BASIC METABOLIC PANEL
Anion gap: 11 (ref 5–15)
BUN: 15 mg/dL (ref 6–20)
CHLORIDE: 89 mmol/L — AB (ref 101–111)
CO2: 29 mmol/L (ref 22–32)
CREATININE: 0.36 mg/dL — AB (ref 0.44–1.00)
Calcium: 8.4 mg/dL — ABNORMAL LOW (ref 8.9–10.3)
GFR calc Af Amer: >60 mL/min (ref >60)
GFR calc non Af Amer: >60 mL/min (ref >60)
Glucose, Bld: 133 mg/dL — ABNORMAL HIGH (ref 65–99)
Potassium: 3.8 mmol/L (ref 3.5–5.1)
Sodium: 129 mmol/L — ABNORMAL LOW (ref 135–145)

## 2018-04-02 LAB — FOLATE: FOLATE: 10.6 ng/mL (ref >5.9)

## 2018-04-02 LAB — OSMOLALITY: Osmolality: 267 mOsm/kg — ABNORMAL LOW (ref 275–295)

## 2018-04-02 LAB — INFLUENZA PANEL BY PCR (TYPE A & B)
Influenza A By PCR: NEGATIVE
Influenza B By PCR: NEGATIVE

## 2018-04-02 LAB — HEMOGLOBIN AND HEMATOCRIT, BLOOD
HCT: 27.1 % — ABNORMAL LOW (ref 36.0–46.0)
Hemoglobin: 8.8 g/dL — ABNORMAL LOW (ref 12.0–15.0)

## 2018-04-02 LAB — HEMOGLOBIN A1C
HEMOGLOBIN A1C: 5.3 % (ref 4.8–5.6)
Mean Plasma Glucose: 105.41 mg/dL

## 2018-04-02 LAB — GLUCOSE, CAPILLARY
GLUCOSE-CAPILLARY: 134 mg/dL — AB (ref 65–99)
GLUCOSE-CAPILLARY: 157 mg/dL — AB (ref 65–99)

## 2018-04-02 LAB — VITAMIN B12: VITAMIN B 12: 188 pg/mL (ref 180–914)

## 2018-04-02 LAB — TSH: TSH: 0.768 u[IU]/mL (ref 0.350–4.500)

## 2018-04-02 LAB — FERRITIN: Ferritin: 93 ng/mL (ref 11–307)

## 2018-04-02 MED ORDER — AZITHROMYCIN 250 MG PO TABS
250.0000 mg | ORAL_TABLET | Freq: Every day | ORAL | Status: DC
  Administered 2018-04-03 – 2018-04-05 (×3): 250 mg via ORAL
  Filled 2018-04-02 (×3): qty 1

## 2018-04-02 MED ORDER — IPRATROPIUM-ALBUTEROL 0.5-2.5 (3) MG/3ML IN SOLN
3.0000 mL | RESPIRATORY_TRACT | Status: DC
  Administered 2018-04-02 – 2018-04-07 (×28): 3 mL via RESPIRATORY_TRACT
  Filled 2018-04-02 (×28): qty 3

## 2018-04-02 MED ORDER — ACETAMINOPHEN 325 MG PO TABS
650.0000 mg | ORAL_TABLET | Freq: Four times a day (QID) | ORAL | Status: DC | PRN
  Administered 2018-04-02 – 2018-04-08 (×11): 650 mg via ORAL
  Filled 2018-04-02 (×12): qty 2

## 2018-04-02 MED ORDER — SODIUM CHLORIDE 0.9 % IV SOLN
INTRAVENOUS | Status: AC
  Administered 2018-04-02 – 2018-04-03 (×3): via INTRAVENOUS

## 2018-04-02 MED ORDER — AZITHROMYCIN 250 MG PO TABS
500.0000 mg | ORAL_TABLET | Freq: Every day | ORAL | Status: AC
Start: 2018-04-02 — End: 2018-04-02
  Administered 2018-04-02: 500 mg via ORAL
  Filled 2018-04-02: qty 2

## 2018-04-02 MED ORDER — NICOTINE 21 MG/24HR TD PT24
21.0000 mg | MEDICATED_PATCH | Freq: Every day | TRANSDERMAL | Status: DC
  Administered 2018-04-02 – 2018-04-08 (×7): 21 mg via TRANSDERMAL
  Filled 2018-04-02 (×8): qty 1

## 2018-04-02 NOTE — Progress Notes (Signed)
Initial Nutrition Assessment  DOCUMENTATION CODES:   Underweight, Severe malnutrition in context of chronic illness  INTERVENTION:   Ensure Enlive po BID, each supplement provides 350 kcal and 20 grams of protein  NUTRITION DIAGNOSIS:   Severe Malnutrition related to chronic illness(COPD) as evidenced by severe muscle depletion, severe fat depletion, percent weight loss(11% in < 6 months)  GOAL:   Patient will meet greater than or equal to 90% of their needs  MONITOR:   PO intake, Supplement acceptance, Labs, Weight trends, I & O's  REASON FOR ASSESSMENT:   Consult Assessment of nutrition requirement/status  ASSESSMENT:   Pt with PMH of severe COPD on chronic 2L home O2, GERD, HTN, HLD, and DM presents with SOB found COPD exacerbation and UTI   No historians at bedside during visit. Pt did not elaborate on RD questions.  Pt reports a fair appetite PTA and currently. Meal completion records reveal pt consumed 75% of breakfast this morning. Pt reports disliking grits but eating everything else.   Pt endorses weight loss. Pt reports a UBW of 140 lbs but unsure last time she weighed this. Per weight records, pt's weight has continuously declined over the past year. Pt is down 12 lbs from November 2018, this is 11% weight loss in < 6 months (significant for time frame).   Pt amenable to nutrition supplementation while admitted. Reports enjoying Ensure.   Pt's last recorded Hemoglobin A1C 5.5 (WNL) was 02/08/16.   Labs reviewed; CBG 134-207, Na 129, Chloride 89, Hemoglobin 8.8, HCT 27.1 Medications reviewed; Solu-medrol, NS at 60 mL/hr   NUTRITION - FOCUSED PHYSICAL EXAM:    Most Recent Value  Orbital Region  Moderate depletion  Upper Arm Region  Severe depletion  Thoracic and Lumbar Region  Moderate depletion  Buccal Region  Severe depletion  Temple Region  Severe depletion  Clavicle Bone Region  Moderate depletion  Clavicle and Acromion Bone Region  Moderate depletion   Scapular Bone Region  Unable to assess  Dorsal Hand  Unable to assess  Patellar Region  Severe depletion  Anterior Thigh Region  Severe depletion  Posterior Calf Region  Severe depletion  Edema (RD Assessment)  None     Diet Order:  Diet regular Room service appropriate? Yes; Fluid consistency: Thin  EDUCATION NEEDS:   Not appropriate for education at this time  Skin:  Skin Assessment: Reviewed RN Assessment  Last BM:  04/02/18  Height:   Ht Readings from Last 1 Encounters:  04/01/18 5\' 4"  (1.626 m)   Weight:   Wt Readings from Last 1 Encounters:  04/01/18 100 lb 8.5 oz (45.6 kg)   Ideal Body Weight:  54.5 kg  BMI:  Body mass index is 17.26 kg/m.  Estimated Nutritional Needs:   Kcal:  1735-6701  Protein:  60-75 grams  Fluid:  >/= 1.5 L/d  Parks Ranger, MS, RDN, LDN 04/02/2018 12:15 PM

## 2018-04-02 NOTE — Progress Notes (Signed)
PROGRESS NOTE    Jacqueline Cervantes  KWI:[id number]  DOB: February 18, 1935  DOA: 04/01/2018 PCP: [name], FNP   Brief Admission Hx: Jacqueline Cervantes is a 83 y.o. female with medical history significant for HTN, DM, HLD, current tobacco abuse, severe COPD on chronic 2 L O2 at home, left breast cancer.  She presents to ED complaining of progressive SOB for weeks leading to severe debility.  She has been so SOB she can barely ambulate to the bathroom.  She was admitted with COPD exacerbation.    MDM/Assessment & Plan:   1. Acute COPD exacerbation - Pt remains severely SOB and not ready to discharge, will intensify therapy with nebs, steroids, antibiotics, oxygen and supportive care.  I personally reviewed the repeat 2 view CXR and is consistent with COPD but no acute infiltrate or other acute findings seen.   2. UTI - treating with ceftriaxone IV.  Follow culture and sensitivity results.   3. Anemia, unspecified - Anemia panel pending, hemoccult stools.  Recheck CBC in AM.  4. Hyponatremia - suspect secondary to dehydration but I do worry about SIADH and underlying malignancy.  Will hydrate for now and repeat BMP in AM. Urine osmololality and serum osmolality and urine lytes pending. 5. Essential hypertension - blood pressures stable, resumed home medications.   6. Type 2 Diabetes Mellitus - monitoring blood sugars but no insulin ordered for now.  A1c 5.7%.  DVT prophylaxis: SCDs Code Status: Full  Family Communication: patient  Disposition Plan: pending medical improvement.    Subjective: Pt says that she is very SOB and severely weak, barely able to get to bathroom.    Objective: Vitals:   04/01/18 2134 04/01/18 2211 04/02/18 0236 04/02/18 0642  BP:  (!) 126/49  (!) 126/52  Pulse:  (!) 102  92  Resp:  15  18  Temp:  98.6 F (37 C)    TempSrc:  Oral    SpO2: 94% 100% 98% 100%  Weight:      Height:        Intake/Output Summary (Last 24 hours) at 04/02/2018 0911 Last data  filed at 04/02/2018 0902 Gross per 24 hour  Intake 2419.17 ml  Output -  Net 2419.17 ml   Filed Weights   04/01/18 1208 04/01/18 1214 04/01/18 1800  Weight: 45.4 kg (100 lb) 52.2 kg (115 lb) 45.6 kg (100 lb 8.5 oz)     REVIEW OF SYSTEMS  As per history otherwise all reviewed and reported negative  Exam:  General exam: awake, alert, NAD, cooperative.  Respiratory system: diffuse insp/exp wheezing, shallow breathing.   Cardiovascular system: S1 & S2 heard, RRR. No JVD, murmurs, gallops, clicks or pedal edema. Gastrointestinal system: Abdomen is nondistended, soft and nontender. Normal bowel sounds heard. Central nervous system: Alert and oriented. No focal neurological deficits. Extremities: no CCE.  Data Reviewed: Basic Metabolic Panel: Recent Labs  Lab 04/01/18 1218 04/02/18 0512  NA 130* 129*  K 4.1 3.8  CL 86* 89*  CO2 30 29  GLUCOSE 81 133*  BUN 11 15  CREATININE 0.45 0.36*  CALCIUM 9.6 8.4*   Liver Function Tests: Recent Labs  Lab 04/01/18 1218  AST 20  ALT 10*  ALKPHOS 25*  BILITOT 0.4  PROT 7.4  ALBUMIN 3.5   No results for input(s): LIPASE, AMYLASE in the last 168 hours. No results for input(s): AMMONIA in the last 168 hours. CBC: Recent Labs  Lab 04/01/18 1218 04/02/18 0512  WBC 9.8  --  NEUTROABS 6.4  --   HGB 9.9* 8.8*  HCT 31.3* 27.1*  MCV 81.3  --   PLT 476*  --    Cardiac Enzymes: Recent Labs  Lab 04/01/18 1218  TROPONINI <0.03   CBG (last 3)  Recent Labs    04/01/18 2212 04/02/18 0812  GLUCAP 207* 134*   No results found for this or any previous visit (from the past 240 hour(s)).   Studies: Dg Chest 2 View  Result Date: 04/02/2018 CLINICAL DATA:  Shortness of breath.  History of breast carcinoma EXAM: CHEST - 2 VIEW COMPARISON:  April 01, 2018 and April 07, 2016 FINDINGS: There is no edema or consolidation. Heart size and pulmonary vascularity are normal. No adenopathy. There is aortic atherosclerosis. There are anterior  wedge compression fractures in the mid and lower thoracic region, also present in 2017. IMPRESSION: No edema or consolidation.  Aortic atherosclerosis. Aortic Atherosclerosis (ICD10-I70.0). Electronically Signed   By: Lowella Grip III M.D.   On: 04/02/2018 08:10   Dg Chest Port 1 View  Result Date: 04/01/2018 CLINICAL DATA:  Cough and shortness of breath. EXAM: PORTABLE CHEST 1 VIEW COMPARISON:  July 09, 2017 FINDINGS: Prominent skin fold over the right lateral chest are identified. Lung markings are seen on both sides with no convincing evidence of pneumothorax. The heart, hila, and mediastinum are normal. No nodules or masses. No focal infiltrates. No other acute abnormalities. IMPRESSION: Prominent skin folds over the right lateral chest with no convincing evidence of pneumothorax. The patient may benefit from a PA and lateral chest x-ray before discharge. No acute abnormality seen. Electronically Signed   By: Dorise Bullion III M.D   On: 04/01/2018 12:45     Scheduled Meds: . amLODipine  10 mg Oral Daily  . atorvastatin  40 mg Oral Daily  . azithromycin  500 mg Oral Daily   Followed by  . [START ON 04/03/2018] azithromycin  250 mg Oral Daily  . citalopram  20 mg Oral Daily  . feeding supplement (ENSURE ENLIVE)  237 mL Oral BID BM  . fenofibrate  160 mg Oral Daily  . ipratropium-albuterol  3 mL Nebulization Q4H  . losartan  25 mg Oral Daily  . methylPREDNISolone (SOLU-MEDROL) injection  40 mg Intravenous Q12H  . mometasone-formoterol  2 puff Inhalation BID   Continuous Infusions: . sodium chloride    . cefTRIAXone (ROCEPHIN)  IV      Active Problems:   HTN (hypertension)   DM (diabetes mellitus) ([place])   COPD with acute exacerbation ([place])   Time spent:   Irwin Brakeman, MD, FAAFP [facility] Pager [phone]  If 7PM-7AM, please contact night-coverage [email or web address] Password [email or web address] 04/02/2018, 9:11 AM    LOS: 0 days

## 2018-04-02 NOTE — Plan of Care (Signed)
  Problem: Acute Rehab PT Goals(only PT should resolve) Goal: Pt Will Go Supine/Side To Sit Outcome: Progressing Flowsheets (Taken 04/02/2018 1031) Pt will go Supine/Side to Sit: Independently Goal: Patient Will Transfer Sit To/From Stand Outcome: Progressing Flowsheets (Taken 04/02/2018 1031) Patient will transfer sit to/from stand: with supervision Goal: Pt Will Transfer Bed To Chair/Chair To Bed Outcome: Progressing Flowsheets (Taken 04/02/2018 1031) Pt will Transfer Bed to Chair/Chair to Bed: with supervision Goal: Pt Will Ambulate Outcome: Progressing Flowsheets (Taken 04/02/2018 1031) Pt will Ambulate: 50 feet;with supervision;with rolling walker    10:32 AM, 04/02/18 Lonell Grandchild, MPT Physical Therapist with [facility] [phone] office [phone] mobile phone

## 2018-04-02 NOTE — Evaluation (Signed)
Physical Therapy Evaluation Patient Details Name: Jacqueline Cervantes MRN: [id number] DOB: 1935/04/22 Today's Date: 04/02/2018   History of Present Illness  Jacqueline Cervantes is a 83 y.o. female with medical history significant for HTN, DM, HLD, current tobacco abuse, severe COPD on chronic 2 L O2 at home, left breast cancer hx. History is obtained mostly from chart review, and patient's daughter-in-law, as patient answers a few of my questions appropriately but cannot remember the symptoms or details of what brought her to the ED.     Clinical Impression  Patient demonstrates slightly labored movement and limited for walking mostly due to c/o SOB on exertion, walked to doorway and back to bedside and tolerated sitting up in chair after therapy - RN notified.  Patient will benefit from continued physical therapy in hospital and recommended venue below to increase strength, balance, endurance for safe ADLs and gait.    Follow Up Recommendations No PT follow up;Supervision for mobility/OOB    Equipment Recommendations  None recommended by PT    Recommendations for Other Services       Precautions / Restrictions Precautions Precautions: Fall Restrictions Weight Bearing Restrictions: No      Mobility  Bed Mobility Overal bed mobility: Modified Independent                Transfers Overall transfer level: Needs assistance Equipment used: Rolling walker (2 wheeled);1 person hand held assist Transfers: Sit to/from [facility] Sit to Stand: Supervision Stand pivot transfers: Min guard          Ambulation/Gait Ambulation/Gait assistance: Min guard Ambulation Distance (Feet): 15 Feet Assistive device: Rolling walker (2 wheeled) Gait Pattern/deviations: Decreased step length - right;Decreased step length - left;Decreased stride length   Gait velocity interpretation: Below normal speed for age/gender General Gait Details: slightly unsteady, required hand held  assist when not using AD, limited mostly due to SOB  Stairs            Wheelchair Mobility    Modified Rankin (Stroke Patients Only)       Balance Overall balance assessment: Needs assistance Sitting-balance support: Feet supported;No upper extremity supported Sitting balance-Leahy Scale: Good     Standing balance support: No upper extremity supported;During functional activity Standing balance-Leahy Scale: Fair Standing balance comment: fair/good with RW                             Pertinent Vitals/Pain Pain Assessment: No/denies pain    Home Living Family/patient expects to be discharged to:: Private residence Living Arrangements: Spouse/significant other;Children Available Help at Discharge: Family Type of Home: Mobile home Home Access: Stairs to enter;Ramped entrance Entrance Stairs-Rails: Right;Left;Can reach both Entrance Stairs-Number of Steps: 3 Home Layout: One level Home Equipment: [place] - 4 wheels;Cane - single point;Shower seat      Prior Function Level of Independence: Independent         Comments: independent for household distances, Home O2 dependent     Hand Dominance   Dominant Hand: Right    Extremity/Trunk Assessment   Upper Extremity Assessment Upper Extremity Assessment: Generalized weakness    Lower Extremity Assessment Lower Extremity Assessment: Generalized weakness    Cervical / Trunk Assessment Cervical / Trunk Assessment: Normal  Communication   Communication: No difficulties  Cognition Arousal/Alertness: Awake/alert Behavior During Therapy: WFL for tasks assessed/performed Overall Cognitive Status: Within Functional Limits for tasks assessed  General Comments      Exercises     Assessment/Plan    PT Assessment Patient needs continued PT services  PT Problem List Decreased strength;Decreased activity tolerance;Decreased  balance;Cardiopulmonary status limiting activity;Decreased mobility       PT Treatment Interventions Gait training;Functional mobility training;Therapeutic activities;Therapeutic exercise;Patient/family education;Stair training    PT Goals (Current goals can be found in the Care Plan section)  Acute Rehab PT Goals Patient Stated Goal: Return home PT Goal Formulation: With patient Time For Goal Achievement: 04/06/18 Potential to Achieve Goals: Good    Frequency Min 3X/week   Barriers to discharge        Co-evaluation               AM-PAC PT "6 Clicks" Daily Activity  Outcome Measure Difficulty turning over in bed (including adjusting bedclothes, sheets and blankets)?: None Difficulty moving from lying on back to sitting on the side of the bed? : None Difficulty sitting down on and standing up from a chair with arms (e.g., wheelchair, bedside commode, etc,.)?: A Little Help needed moving to and from a bed to chair (including a wheelchair)?: A Little Help needed walking in hospital room?: A Little Help needed climbing 3-5 steps with a railing? : A Little 6 Click Score: 20    End of Session Equipment Utilized During Treatment: Oxygen Activity Tolerance: Patient tolerated treatment well;Patient limited by fatigue(Patient limited by SOB) Patient left: in chair;with call bell/phone within reach Nurse Communication: Mobility status;Other (comment)(RN notified that patient left up in chair) PT Visit Diagnosis: Unsteadiness on feet (R26.81);Other abnormalities of gait and mobility (R26.89);Muscle weakness (generalized) (M62.81)    Time: 3383-2919 PT Time Calculation (min) (ACUTE ONLY): 23 min   Charges:   PT Evaluation $PT Eval Moderate Complexity: 1 Mod PT Treatments $Therapeutic Activity: 23-37 mins   PT G Codes:        10:31 AM, 04-15-2018 Lonell Grandchild, MPT Physical Therapist with [facility] 336 [phone] office [phone] mobile phone

## 2018-04-02 NOTE — Care Management Obs Status (Signed)
[place] NOTIFICATION   Patient Details  Name: Jacqueline Cervantes MRN: [id number] Date of Birth: [birth year]   Medicare Observation Status Notification Given:  Yes    Dala Breault, Chauncey Reading, RN 04/02/2018, 11:11 AM

## 2018-04-02 NOTE — Clinical Social Work Note (Signed)
Patient states that she lives with her daughter and husband. She states that she does not have any needs pertaining to food.  She denied food insecurity.   Patient stated that she has not CSW needs.   LCSW signing off.

## 2018-04-03 DIAGNOSIS — E43 Unspecified severe protein-calorie malnutrition: Secondary | ICD-10-CM | POA: Diagnosis not present

## 2018-04-03 DIAGNOSIS — E871 Hypo-osmolality and hyponatremia: Secondary | ICD-10-CM | POA: Diagnosis not present

## 2018-04-03 DIAGNOSIS — E089 Diabetes mellitus due to underlying condition without complications: Secondary | ICD-10-CM

## 2018-04-03 DIAGNOSIS — I10 Essential (primary) hypertension: Secondary | ICD-10-CM | POA: Diagnosis not present

## 2018-04-03 DIAGNOSIS — R531 Weakness: Secondary | ICD-10-CM

## 2018-04-03 DIAGNOSIS — J441 Chronic obstructive pulmonary disease with (acute) exacerbation: Principal | ICD-10-CM

## 2018-04-03 DIAGNOSIS — J9621 Acute and chronic respiratory failure with hypoxia: Secondary | ICD-10-CM | POA: Diagnosis not present

## 2018-04-03 DIAGNOSIS — J9611 Chronic respiratory failure with hypoxia: Secondary | ICD-10-CM | POA: Diagnosis not present

## 2018-04-03 LAB — BASIC METABOLIC PANEL
Anion gap: 11 (ref 5–15)
BUN: 14 mg/dL (ref 6–20)
CALCIUM: 8.8 mg/dL — AB (ref 8.9–10.3)
CO2: 27 mmol/L (ref 22–32)
CREATININE: 0.63 mg/dL (ref 0.44–1.00)
Chloride: 90 mmol/L — ABNORMAL LOW (ref 101–111)
GFR calc Af Amer: >60 mL/min (ref >60)
GFR calc non Af Amer: >60 mL/min (ref >60)
Glucose, Bld: 243 mg/dL — ABNORMAL HIGH (ref 65–99)
Potassium: 4.6 mmol/L (ref 3.5–5.1)
Sodium: 128 mmol/L — ABNORMAL LOW (ref 135–145)

## 2018-04-03 LAB — CBC
HEMATOCRIT: 27.4 % — AB (ref 36.0–46.0)
Hemoglobin: 8.8 g/dL — ABNORMAL LOW (ref 12.0–15.0)
MCH: 26.3 pg (ref 26.0–34.0)
MCHC: 32.1 g/dL (ref 30.0–36.0)
MCV: 81.8 fL (ref 78.0–100.0)
Platelets: 381 10*3/uL (ref 150–400)
RBC: 3.35 MIL/uL — ABNORMAL LOW (ref 3.87–5.11)
RDW: 17.5 % — AB (ref 11.5–15.5)
WBC: 9.1 10*3/uL (ref 4.0–10.5)

## 2018-04-03 LAB — NA AND K (SODIUM & POTASSIUM), RAND UR
Potassium Urine: 18 mmol/L
Sodium, Ur: 102 mmol/L

## 2018-04-03 LAB — GLUCOSE, CAPILLARY: Glucose-Capillary: 126 mg/dL — ABNORMAL HIGH (ref 65–99)

## 2018-04-03 MED ORDER — CYANOCOBALAMIN 1000 MCG/ML IJ SOLN
1000.0000 ug | Freq: Once | INTRAMUSCULAR | Status: AC
  Administered 2018-04-03: 1000 ug via INTRAMUSCULAR
  Filled 2018-04-03: qty 1

## 2018-04-03 MED ORDER — GUAIFENESIN ER 600 MG PO TB12
1200.0000 mg | ORAL_TABLET | Freq: Two times a day (BID) | ORAL | Status: DC
  Administered 2018-04-03 – 2018-04-08 (×11): 1200 mg via ORAL
  Filled 2018-04-03 (×11): qty 2

## 2018-04-03 MED ORDER — ALBUTEROL SULFATE (2.5 MG/3ML) 0.083% IN NEBU
2.5000 mg | INHALATION_SOLUTION | RESPIRATORY_TRACT | Status: DC | PRN
  Administered 2018-04-07: 2.5 mg via RESPIRATORY_TRACT
  Filled 2018-04-03: qty 3

## 2018-04-03 MED ORDER — METHYLPREDNISOLONE SODIUM SUCC 125 MG IJ SOLR
60.0000 mg | Freq: Three times a day (TID) | INTRAMUSCULAR | Status: DC
  Administered 2018-04-03 – 2018-04-05 (×7): 60 mg via INTRAVENOUS
  Filled 2018-04-03 (×7): qty 2

## 2018-04-03 NOTE — Progress Notes (Signed)
PROGRESS NOTE    Jacqueline Cervantes  MWN:[id number]  DOB: 1935-07-17  DOA: 04/01/2018 PCP: [name], FNP   Brief Admission Hx: RIELLY Cervantes is a 83 y.o. female with medical history significant for HTN, DM, HLD, current tobacco abuse, severe COPD on chronic 2 L O2 at home, left breast cancer.  She presents to ED complaining of progressive SOB for weeks leading to severe debility.  She has been so SOB she can barely ambulate to the bathroom.  She was admitted with COPD exacerbation.    MDM/Assessment & Plan:   1. Acute COPD exacerbation -patient has oxygen dependent COPD and is chronically on 2 L.  Patient remains short of breath.  Influenza panel is negative.  Continue on bronchodilators and pulmonary hygiene.  She is also on antibiotics with azithromycin.  Continue on IV steroids since she is still wheezing and short of breath. 2. UTI - treating with ceftriaxone IV.  Initial culture results indicate gram-negative rods.  Continue to follow.   3. Anemia, unspecified -hemoglobin has had a mild decline since admission, no obvious bleeding.  Anemia panel indicates chronic disease.  Continue to monitor.  B12 was also on the lower range of normal.  Will replace. 4. Hyponatremia -urine studies including urine sodium and osmolality have been ordered, but not collected as of yet.  Serum osmolarity is low.  Suspect that she has some degree of SIADH from her underlying lung disease.  Continue to monitor. 5. Essential hypertension - blood pressures stable, resumed home medications.   6. Type 2 Diabetes Mellitus - monitoring blood sugars but no insulin ordered for now.  A1c 5.7%.  DVT prophylaxis: SCDs Code Status: Full  Family Communication: No family present Disposition Plan: Pending clinical improvement.  May need placement.   Subjective: Feels more short of breath today than she did yesterday.  Has nonproductive cough.  Objective: Vitals:   04/03/18 0846 04/03/18 1113 04/03/18 1322  04/03/18 1525  BP:   (!) 107/47   Pulse:   (!) 103   Resp:   (!) 22   Temp:   98.5 F (36.9 C)   TempSrc:   Oral   SpO2: 98% 99% 98% 98%  Weight:      Height:        Intake/Output Summary (Last 24 hours) at 04/03/2018 1902 Last data filed at 04/03/2018 1800 Gross per 24 hour  Intake 720 ml  Output 700 ml  Net 20 ml   Filed Weights   04/01/18 1208 04/01/18 1214 04/01/18 1800  Weight: 45.4 kg (100 lb) 52.2 kg (115 lb) 45.6 kg (100 lb 8.5 oz)     REVIEW OF SYSTEMS  As per history otherwise all reviewed and reported negative  Exam:  General exam: Alert, awake, oriented x 3 Respiratory system: Bilateral wheezes and rhonchi. Respiratory effort normal. Cardiovascular system:RRR. No murmurs, rubs, gallops. Gastrointestinal system: Abdomen is nondistended, soft and nontender. No organomegaly or masses felt. Normal bowel sounds heard. Central nervous system: Alert and oriented. No focal neurological deficits. Extremities: No C/C/E, +pedal pulses Skin: No rashes, lesions or ulcers Psychiatry: Judgement and insight appear normal. Mood & affect appropriate.    Data Reviewed: Basic Metabolic Panel: Recent Labs  Lab 04/01/18 1218 04/02/18 0512 04/03/18 0936  NA 130* 129* 128*  K 4.1 3.8 4.6  CL 86* 89* 90*  CO2 30 29 27   GLUCOSE 81 133* 243*  BUN 11 15 14   CREATININE 0.45 0.36* 0.63  CALCIUM 9.6 8.4* 8.8*  Liver Function Tests: Recent Labs  Lab 04/01/18 1218  AST 20  ALT 10*  ALKPHOS 25*  BILITOT 0.4  PROT 7.4  ALBUMIN 3.5   No results for input(s): LIPASE, AMYLASE in the last 168 hours. No results for input(s): AMMONIA in the last 168 hours. CBC: Recent Labs  Lab 04/01/18 1218 04/02/18 0512 04/03/18 0936  WBC 9.8  --  9.1  NEUTROABS 6.4  --   --   HGB 9.9* 8.8* 8.8*  HCT 31.3* 27.1* 27.4*  MCV 81.3  --  81.8  PLT 476*  --  381   Cardiac Enzymes: Recent Labs  Lab 04/01/18 1218  TROPONINI <0.03   CBG (last 3)  Recent Labs    04/02/18 0812  04/02/18 1127 04/03/18 0541  GLUCAP 134* 157* 126*   Recent Results (from the past 240 hour(s))  Urine Culture     Status: Abnormal (Preliminary result)   Collection Time: 04/01/18 12:13 PM  Result Value Ref Range Status   Specimen Description   Final    URINE, CLEAN CATCH Performed at [facility], [address], [place], [place] [address]    Special Requests   Final    NONE Performed at [facility], [address] [place], [place] [address]    Culture (A)  Final    >=100,000 COLONIES/mL ESCHERICHIA COLI SUSCEPTIBILITIES TO FOLLOW Performed at [facility] [address], [place], [place] [address]    Report Status PENDING  Incomplete     Studies: Dg Chest 2 View  Result Date: 04/02/2018 CLINICAL DATA:  Shortness of breath.  History of breast carcinoma EXAM: CHEST - 2 VIEW COMPARISON:  April 01, 2018 and April 07, 2016 FINDINGS: There is no edema or consolidation. Heart size and pulmonary vascularity are normal. No adenopathy. There is aortic atherosclerosis. There are anterior wedge compression fractures in the mid and lower thoracic region, also present in 2017. IMPRESSION: No edema or consolidation.  Aortic atherosclerosis. Aortic Atherosclerosis (ICD10-I70.0). Electronically Signed   By: Lowella Grip III M.D.   On: 04/02/2018 08:10     Scheduled Meds: . amLODipine  10 mg Oral Daily  . atorvastatin  40 mg Oral Daily  . azithromycin  250 mg Oral Daily  . citalopram  20 mg Oral Daily  . feeding supplement (ENSURE ENLIVE)  237 mL Oral BID BM  . fenofibrate  160 mg Oral Daily  . guaiFENesin  1,200 mg Oral BID  . ipratropium-albuterol  3 mL Nebulization Q4H  . losartan  25 mg Oral Daily  . methylPREDNISolone (SOLU-MEDROL) injection  60 mg Intravenous Q8H  . mometasone-formoterol  2 puff Inhalation BID  . nicotine  21 mg Transdermal Daily   Continuous Infusions: . cefTRIAXone (ROCEPHIN)  IV Stopped (04/03/18 1430)    Active Problems:   HTN (hypertension)    DM (diabetes mellitus) ([place])   COPD with acute exacerbation ([facility])   Protein-calorie malnutrition, severe   Kathie Dike, MD [facility] Pager [phone]  If 7PM-7AM, please contact night-coverage [email or web address] Password [email or web address] 04/03/2018, 7:02 PM    LOS: 0 days

## 2018-04-03 NOTE — Progress Notes (Signed)
Physical Therapy Treatment Patient Details Name: Jacqueline Cervantes MRN: [id number] DOB: 1935-03-06 Today's Date: 04/03/2018    History of Present Illness Jacqueline Cervantes is a 83 y.o. female with medical history significant for HTN, DM, HLD, current tobacco abuse, severe COPD on chronic 2 L O2 at home, left breast cancer hx. History is obtained mostly from chart review, and patient's daughter-in-law, as patient answers a few of my questions appropriately but cannot remember the symptoms or details of what brought her to the ED.     PT Comments    Patient demonstrates good return for completing BLE exercises while seated at bedside without loss of sitting balance, required frequent rest breaks due to c/o SOB, on 2 LPM with O2 sats at 96%.  Patient declined to transfer to chair or attempt gait training due to difficulty breathing - RN notified that patient requests breathing treatment.  Patient will benefit from continued physical therapy in hospital and recommended venue below to increase strength, balance, endurance for safe ADLs and gait.   Follow Up Recommendations  No PT follow up;Supervision for mobility/OOB     Equipment Recommendations  None recommended by PT    Recommendations for Other Services       Precautions / Restrictions Precautions Precautions: Fall Restrictions Weight Bearing Restrictions: No    Mobility  Bed Mobility Overal bed mobility: Modified Independent                Transfers                    Ambulation/Gait                 Stairs            Wheelchair Mobility    Modified Rankin (Stroke Patients Only)       Balance Overall balance assessment: Needs assistance Sitting-balance support: Feet unsupported;No upper extremity supported Sitting balance-Leahy Scale: Good                                      Cognition Arousal/Alertness: Awake/alert Behavior During Therapy: WFL for tasks  assessed/performed Overall Cognitive Status: Within Functional Limits for tasks assessed                                        Exercises General Exercises - Lower Extremity Ankle Circles/Pumps: Seated;AROM;Strengthening;Both;10 reps Long Arc Quad: Seated;AROM;Strengthening;Both;10 reps Hip Flexion/Marching: Seated;AROM;Strengthening;Both;10 reps    General Comments        Pertinent Vitals/Pain Pain Assessment: No/denies pain    Home Living                      Prior Function            PT Goals (current goals can now be found in the care plan section) Acute Rehab PT Goals Patient Stated Goal: Return home PT Goal Formulation: With patient Time For Goal Achievement: 04/06/18 Potential to Achieve Goals: Good Progress towards PT goals: Progressing toward goals    Frequency    Min 3X/week      PT Plan Current plan remains appropriate    Co-evaluation              AM-PAC PT "6 Clicks" Daily Activity  Outcome Measure  Difficulty turning over in  bed (including adjusting bedclothes, sheets and blankets)?: None Difficulty moving from lying on back to sitting on the side of the bed? : None Difficulty sitting down on and standing up from a chair with arms (e.g., wheelchair, bedside commode, etc,.)?: A Little Help needed moving to and from a bed to chair (including a wheelchair)?: A Little Help needed walking in hospital room?: A Little Help needed climbing 3-5 steps with a railing? : A Little 6 Click Score: 20    End of Session Equipment Utilized During Treatment: Oxygen Activity Tolerance: Patient limited by fatigue(Patient limited by SOB) Patient left: in bed;with call bell/phone within reach;with bed alarm set Nurse Communication: Mobility status;Other (comment)(RN notified patient request breathing treatment) PT Visit Diagnosis: Unsteadiness on feet (R26.81);Other abnormalities of gait and mobility (R26.89);Muscle weakness  (generalized) (M62.81)     Time: 3646-8032 PT Time Calculation (min) (ACUTE ONLY): 18 min  Charges:  $Therapeutic Exercise: 8-22 mins                    G Codes:       2:35 PM, 2018/04/23 Lonell Grandchild, MPT Physical Therapist with [facility] 336 [phone] office [phone] mobile phone

## 2018-04-03 NOTE — Plan of Care (Signed)
Progressing

## 2018-04-04 DIAGNOSIS — I10 Essential (primary) hypertension: Secondary | ICD-10-CM | POA: Diagnosis not present

## 2018-04-04 DIAGNOSIS — J9611 Chronic respiratory failure with hypoxia: Secondary | ICD-10-CM

## 2018-04-04 DIAGNOSIS — E871 Hypo-osmolality and hyponatremia: Secondary | ICD-10-CM | POA: Diagnosis not present

## 2018-04-04 DIAGNOSIS — J441 Chronic obstructive pulmonary disease with (acute) exacerbation: Secondary | ICD-10-CM | POA: Diagnosis not present

## 2018-04-04 DIAGNOSIS — E089 Diabetes mellitus due to underlying condition without complications: Secondary | ICD-10-CM | POA: Diagnosis not present

## 2018-04-04 LAB — URINE CULTURE

## 2018-04-04 LAB — BASIC METABOLIC PANEL
Anion gap: 10 (ref 5–15)
BUN: 16 mg/dL (ref 6–20)
CALCIUM: 9.1 mg/dL (ref 8.9–10.3)
CO2: 29 mmol/L (ref 22–32)
CREATININE: 0.44 mg/dL (ref 0.44–1.00)
Chloride: 92 mmol/L — ABNORMAL LOW (ref 101–111)
GFR calc Af Amer: >60 mL/min (ref >60)
Glucose, Bld: 151 mg/dL — ABNORMAL HIGH (ref 65–99)
Potassium: 5.6 mmol/L — ABNORMAL HIGH (ref 3.5–5.1)
SODIUM: 131 mmol/L — AB (ref 135–145)

## 2018-04-04 LAB — CBC
HCT: 26.2 % — ABNORMAL LOW (ref 36.0–46.0)
Hemoglobin: 8.2 g/dL — ABNORMAL LOW (ref 12.0–15.0)
MCH: 25.3 pg — ABNORMAL LOW (ref 26.0–34.0)
MCHC: 31.3 g/dL (ref 30.0–36.0)
MCV: 80.9 fL (ref 78.0–100.0)
PLATELETS: 423 10*3/uL — AB (ref 150–400)
RBC: 3.24 MIL/uL — ABNORMAL LOW (ref 3.87–5.11)
RDW: 17 % — AB (ref 11.5–15.5)
WBC: 10.6 10*3/uL — AB (ref 4.0–10.5)

## 2018-04-04 LAB — GLUCOSE, CAPILLARY
GLUCOSE-CAPILLARY: 141 mg/dL — AB (ref 65–99)
GLUCOSE-CAPILLARY: 138 mg/dL — AB (ref 65–99)
Glucose-Capillary: 183 mg/dL — ABNORMAL HIGH (ref 65–99)

## 2018-04-04 LAB — OSMOLALITY, URINE: OSMOLALITY UR: 527 mosm/kg (ref 300–900)

## 2018-04-04 MED ORDER — SODIUM POLYSTYRENE SULFONATE 15 GM/60ML PO SUSP
30.0000 g | Freq: Once | ORAL | Status: DC
  Filled 2018-04-04: qty 120

## 2018-04-04 MED ORDER — MILK AND MOLASSES ENEMA: 1.0000 | Freq: Once | RECTAL | Status: DC

## 2018-04-04 MED ORDER — SODIUM POLYSTYRENE SULFONATE 15 GM/60ML PO SUSP
30.0000 g | Freq: Once | ORAL | Status: AC
  Administered 2018-04-04: 30 g via ORAL
  Filled 2018-04-04: qty 120

## 2018-04-04 MED ORDER — CYANOCOBALAMIN 1000 MCG/ML IJ SOLN
1000.0000 ug | Freq: Once | INTRAMUSCULAR | Status: AC
  Administered 2018-04-04: 1000 ug via INTRAMUSCULAR
  Filled 2018-04-04: qty 1

## 2018-04-04 MED ORDER — POLYETHYLENE GLYCOL 3350 17 G PO PACK
17.0000 g | PACK | Freq: Every day | ORAL | Status: DC
  Administered 2018-04-04 – 2018-04-07 (×4): 17 g via ORAL
  Filled 2018-04-04 (×4): qty 1

## 2018-04-04 NOTE — Progress Notes (Signed)
PROGRESS NOTE    Jacqueline Cervantes  HQI:[id number]  DOB: 1935-12-05  DOA: 04/01/2018 PCP: [name], FNP   Brief Admission Hx: Jacqueline Cervantes is a 83 y.o. female with medical history significant for HTN, DM, HLD, current tobacco abuse, severe COPD on chronic 2 L O2 at home, left breast cancer.  She presents to ED complaining of progressive SOB for weeks leading to severe debility.  She has been so SOB she can barely ambulate to the bathroom.  She was admitted with COPD exacerbation.    MDM/Assessment & Plan:   1. Acute COPD exacerbation -patient has oxygen dependent COPD and is chronically on 2 L.  Respiratory status is not improved back to baseline.  Influenza panel is negative.  Continue on bronchodilators and pulmonary hygiene.  She is also on antibiotics with azithromycin.  Continue on IV steroids since she is still wheezing and short of breath. 2. UTI -urine culture positive for E. coli.  Treating with ceftriaxone IV.  Continue to follow.  Discontinue antibiotics after tomorrow's dose 3. Anemia, unspecified -hemoglobin has had a decline since admission, no obvious bleeding.  Anemia panel indicates chronic disease.  Continue to monitor.  B12 was also on the lower range of normal and is being replaced.  Continue to follow 4. Hyponatremia -urine studies including urine sodium and osmolality have been ordered, but not collected as of yet.  Serum osmolarity is low.  Suspect that she has some degree of SIADH from her underlying lung disease.  She has had mild improvement with IV fluids.  Continue to monitor.   5. Hyperkalemia.  ARB has been discontinued.  We will give 1 dose of Kayexalate 6. Essential hypertension - blood pressures stable, resumed home medications.   7. Type 2 Diabetes Mellitus - monitoring blood sugars but no insulin ordered for now.  A1c 5.7%. 8. Constipation.  We will start the patient on MiraLAX  DVT prophylaxis: SCDs Code Status: Full  Family Communication: No  family present Disposition Plan: Discharge home once respiratory status has improved.  Seen by physical therapy and felt okay to return home.   Subjective: Feels her breathing is mildly better today, but not back to baseline.  Has not had a bowel movement in 2 days and feels uncomfortable due to abdominal distention.  Feels weak.  Objective: Vitals:   04/04/18 0748 04/04/18 1156 04/04/18 1431 04/04/18 1512  BP:   (!) 128/58   Pulse:   (!) 107   Resp:   18   Temp:   98.4 F (36.9 C)   TempSrc:   Oral   SpO2: 100% 99% 99% 98%  Weight:      Height:        Intake/Output Summary (Last 24 hours) at 04/04/2018 1655 Last data filed at 04/04/2018 1300 Gross per 24 hour  Intake 960 ml  Output 1550 ml  Net -590 ml   Filed Weights   04/01/18 1208 04/01/18 1214 04/01/18 1800  Weight: 45.4 kg (100 lb) 52.2 kg (115 lb) 45.6 kg (100 lb 8.5 oz)     REVIEW OF SYSTEMS  As per history otherwise all reviewed and reported negative  Exam:  General exam: Alert, awake, oriented x 3 Respiratory system: Bilateral wheezing and rhonchi. Respiratory effort normal. Cardiovascular system:RRR. No murmurs, rubs, gallops. Gastrointestinal system: Abdomen is distended, full, soft and nontender. No organomegaly or masses felt. Normal bowel sounds heard. Central nervous system: Alert and oriented. No focal neurological deficits. Extremities: No C/C/E, +pedal pulses Skin:  No rashes, lesions or ulcers Psychiatry: Judgement and insight appear normal. Mood & affect appropriate.   Data Reviewed: Basic Metabolic Panel: Recent Labs  Lab 04/01/18 1218 04/02/18 0512 04/03/18 0936 04/04/18 0603  NA 130* 129* 128* 131*  K 4.1 3.8 4.6 5.6*  CL 86* 89* 90* 92*  CO2 30 29 27 29   GLUCOSE 81 133* 243* 151*  BUN 11 15 14 16   CREATININE 0.45 0.36* 0.63 0.44  CALCIUM 9.6 8.4* 8.8* 9.1   Liver Function Tests: Recent Labs  Lab 04/01/18 1218  AST 20  ALT 10*  ALKPHOS 25*  BILITOT 0.4  PROT 7.4  ALBUMIN  3.5   No results for input(s): LIPASE, AMYLASE in the last 168 hours. No results for input(s): AMMONIA in the last 168 hours. CBC: Recent Labs  Lab 04/01/18 1218 04/02/18 0512 04/03/18 0936 04/04/18 0603  WBC 9.8  --  9.1 10.6*  NEUTROABS 6.4  --   --   --   HGB 9.9* 8.8* 8.8* 8.2*  HCT 31.3* 27.1* 27.4* 26.2*  MCV 81.3  --  81.8 80.9  PLT 476*  --  381 423*   Cardiac Enzymes: Recent Labs  Lab 04/01/18 1218  TROPONINI <0.03   CBG (last 3)  Recent Labs    04/04/18 0016 04/04/18 0517 04/04/18 0731  GLUCAP 183* 141* 138*   Recent Results (from the past 240 hour(s))  Urine Culture     Status: Abnormal   Collection Time: 04/01/18 12:13 PM  Result Value Ref Range Status   Specimen Description   Final    URINE, CLEAN CATCH Performed at [facility], [address], [place], [place] [address]    Special Requests   Final    NONE Performed at [facility], [address], [place], [place] [address]    Culture >=100,000 COLONIES/mL ESCHERICHIA COLI (A)  Final   Report Status 04/04/2018 FINAL  Final   Organism ID, Bacteria ESCHERICHIA COLI (A)  Final      Susceptibility   Escherichia coli - MIC*    AMPICILLIN 16 INTERMEDIATE Intermediate     CEFAZOLIN <=4 SENSITIVE Sensitive     CEFTRIAXONE <=1 SENSITIVE Sensitive     CIPROFLOXACIN >=4 RESISTANT Resistant     GENTAMICIN <=1 SENSITIVE Sensitive     IMIPENEM <=0.25 SENSITIVE Sensitive     NITROFURANTOIN <=16 SENSITIVE Sensitive     TRIMETH/SULFA <=20 SENSITIVE Sensitive     AMPICILLIN/SULBACTAM 4 SENSITIVE Sensitive     PIP/TAZO <=4 SENSITIVE Sensitive     Extended ESBL NEGATIVE Sensitive     * >=100,000 COLONIES/mL ESCHERICHIA COLI     Studies: No results found.   Scheduled Meds: . amLODipine  10 mg Oral Daily  . atorvastatin  40 mg Oral Daily  . azithromycin  250 mg Oral Daily  . citalopram  20 mg Oral Daily  . feeding supplement (ENSURE ENLIVE)  237 mL Oral BID BM  . fenofibrate  160 mg Oral Daily  .  guaiFENesin  1,200 mg Oral BID  . ipratropium-albuterol  3 mL Nebulization Q4H  . methylPREDNISolone (SOLU-MEDROL) injection  60 mg Intravenous Q8H  . milk and molasses  1 enema Rectal Once  . mometasone-formoterol  2 puff Inhalation BID  . nicotine  21 mg Transdermal Daily  . polyethylene glycol  17 g Oral Daily   Continuous Infusions: . cefTRIAXone (ROCEPHIN)  IV Stopped (04/04/18 1559)    Active Problems:   HTN (hypertension)   DM (diabetes mellitus) ([place])  COPD with acute exacerbation (HCC)   Protein-calorie malnutrition, severe   Kathie Dike, MD [facility] Pager [phone]  If 7PM-7AM, please contact night-coverage [email or web address] Password [email or web address] 04/04/2018, 4:55 PM    LOS: 0 days

## 2018-04-05 DIAGNOSIS — R531 Weakness: Secondary | ICD-10-CM | POA: Diagnosis not present

## 2018-04-05 DIAGNOSIS — J441 Chronic obstructive pulmonary disease with (acute) exacerbation: Secondary | ICD-10-CM | POA: Diagnosis not present

## 2018-04-05 DIAGNOSIS — E871 Hypo-osmolality and hyponatremia: Secondary | ICD-10-CM | POA: Diagnosis not present

## 2018-04-05 DIAGNOSIS — I10 Essential (primary) hypertension: Secondary | ICD-10-CM | POA: Diagnosis not present

## 2018-04-05 DIAGNOSIS — E089 Diabetes mellitus due to underlying condition without complications: Secondary | ICD-10-CM | POA: Diagnosis not present

## 2018-04-05 DIAGNOSIS — E43 Unspecified severe protein-calorie malnutrition: Secondary | ICD-10-CM

## 2018-04-05 LAB — BASIC METABOLIC PANEL
Anion gap: 13 (ref 5–15)
BUN: 22 mg/dL — AB (ref 6–20)
CO2: 32 mmol/L (ref 22–32)
CREATININE: 0.56 mg/dL (ref 0.44–1.00)
Calcium: 9.2 mg/dL (ref 8.9–10.3)
Chloride: 87 mmol/L — ABNORMAL LOW (ref 101–111)
GFR calc Af Amer: >60 mL/min (ref >60)
GLUCOSE: 231 mg/dL — AB (ref 65–99)
Potassium: 5.6 mmol/L — ABNORMAL HIGH (ref 3.5–5.1)
Sodium: 132 mmol/L — ABNORMAL LOW (ref 135–145)

## 2018-04-05 LAB — CBC
HCT: 29.7 % — ABNORMAL LOW (ref 36.0–46.0)
Hemoglobin: 9.4 g/dL — ABNORMAL LOW (ref 12.0–15.0)
MCH: 25.9 pg — ABNORMAL LOW (ref 26.0–34.0)
MCHC: 31.6 g/dL (ref 30.0–36.0)
MCV: 81.8 fL (ref 78.0–100.0)
PLATELETS: 464 10*3/uL — AB (ref 150–400)
RBC: 3.63 MIL/uL — ABNORMAL LOW (ref 3.87–5.11)
RDW: 17.6 % — AB (ref 11.5–15.5)
WBC: 10.4 10*3/uL (ref 4.0–10.5)

## 2018-04-05 LAB — POTASSIUM: POTASSIUM: 4.4 mmol/L (ref 3.5–5.1)

## 2018-04-05 LAB — GLUCOSE, CAPILLARY
Glucose-Capillary: 235 mg/dL — ABNORMAL HIGH (ref 65–99)
Glucose-Capillary: 199 mg/dL — ABNORMAL HIGH (ref 65–99)

## 2018-04-05 MED ORDER — PREDNISONE 20 MG PO TABS
40.0000 mg | ORAL_TABLET | Freq: Every day | ORAL | Status: DC
  Administered 2018-04-06: 40 mg via ORAL
  Filled 2018-04-05: qty 2

## 2018-04-05 MED ORDER — SODIUM POLYSTYRENE SULFONATE 15 GM/60ML PO SUSP
45.0000 g | Freq: Once | ORAL | Status: AC
  Administered 2018-04-05: 45 g via ORAL
  Filled 2018-04-05: qty 180

## 2018-04-05 MED ORDER — METOPROLOL TARTRATE 25 MG PO TABS
25.0000 mg | ORAL_TABLET | Freq: Two times a day (BID) | ORAL | Status: DC
  Administered 2018-04-05 – 2018-04-08 (×6): 25 mg via ORAL
  Filled 2018-04-05 (×6): qty 1

## 2018-04-05 NOTE — Progress Notes (Signed)
PROGRESS NOTE    Jacqueline Cervantes  DXI:[id number]  DOB: July 24, 1935  DOA: 04/01/2018 PCP: [name], FNP   Brief Admission Hx: Jacqueline Cervantes is a 83 y.o. female with medical history significant for HTN, DM, HLD, current tobacco abuse, severe COPD on chronic 2 L O2 at home, left breast cancer.  She presents to ED complaining of progressive SOB for weeks leading to severe debility.  She had been so SOB she could barely ambulate to the bathroom.  She was admitted with COPD exacerbation.  She has been receiving antibiotics, steroids and bronchodilators.  She had slow improvement.  Respiratory status appears to be approaching baseline.  She was also noted to have sinus tachycardia and was started on beta-blockers.  If heart rate remained stable overnight, anticipate discharge home in a.m.  MDM/Assessment & Plan:   1. Acute COPD exacerbation -patient has oxygen dependent COPD and is chronically on 2 L.  Respiratory status has slowly improved and appears to be approaching baseline.  Influenza panel is negative.  Continue on bronchodilators and pulmonary hygiene.  She is completed a course of azithromycin, will discontinue.  Change steroids to prednisone taper. 2. UTI -urine culture positive for E. coli.  Treated with ceftriaxone IV.  We will discontinue further antibiotics since she is been adequately treated. 3. Anemia, unspecified -hemoglobin has had a decline since admission, no obvious bleeding.  Anemia panel indicates chronic disease.  Continue to monitor.  B12 was also on the lower range of normal and was replaced.  Continue to follow.  Follow-up hemoglobins have been stable. 4. Hyponatremia -urine sodium/serum osmolarity indicate SIADH, likely related to COPD. She has had mild improvement with IV fluids.  Continue to monitor.   5. Hyperkalemia.  ARB has been discontinued.  Improved after receiving Kayexalate 6. Essential hypertension - blood pressures stable, resumed home medications  with the exception of ARB.   7. Type 2 Diabetes Mellitus - monitoring blood sugars but no insulin ordered for now.  A1c 5.7%. 8. Constipation.  Improved with MiraLAX 9. Tachycardia.  Heart rate gets in the 120s-130s on ambulation.  Resting heart rate in the 110 range.  EKG shows sinus tachycardia.  Monitor on telemetry overnight.  She is been started on low-dose beta-blocker.  Does not appear septic or toxic or dehydrated.  DVT prophylaxis: SCDs Code Status: Full  Family Communication: Discussed with her daughter on 4/10 Disposition Plan: Discharge home once heart rate has stabilized   Subjective: Patient did have a bowel movement yesterday after receiving MiraLAX.  She would like another dose today.  Feels that breathing is slowly improving and may be approaching baseline.  Denies any palpitations.  Staff has noted that she has been tachycardic on vital check.  This worsens when she ambulates..  Objective: Vitals:   04/05/18 1327 04/05/18 1530 04/05/18 1600 04/05/18 1607  BP:   (!) 129/56   Pulse: (!) 128 (!) 108 (!) 112   Resp:   18   Temp:   99.1 F (37.3 C)   TempSrc:   Oral   SpO2: 98%  100% 99%  Weight:      Height:        Intake/Output Summary (Last 24 hours) at 04/05/2018 1919 Last data filed at 04/05/2018 1820 Gross per 24 hour  Intake 1100 ml  Output 950 ml  Net 150 ml   Filed Weights   04/01/18 1208 04/01/18 1214 04/01/18 1800  Weight: 45.4 kg (100 lb) 52.2 kg (115 lb) 45.6 kg (  100 lb 8.5 oz)     REVIEW OF SYSTEMS  As per history otherwise all reviewed and reported negative  Exam:  General exam: Alert, awake, oriented x 3 Respiratory system: Mild wheeze bilaterally, scattered rhonchi. Respiratory effort normal. Cardiovascular system: Mildly tachycardic, regular. No murmurs, rubs, gallops. Gastrointestinal system: Abdomen is distended, full, soft and nontender. No organomegaly or masses felt. Normal bowel sounds heard. Central nervous system: Alert and  oriented. No focal neurological deficits. Extremities: No C/C/E, +pedal pulses Skin: No rashes, lesions or ulcers Psychiatry: Judgement and insight appear normal. Mood & affect appropriate.   Data Reviewed: Basic Metabolic Panel: Recent Labs  Lab 04/01/18 1218 04/02/18 0512 04/03/18 0936 04/04/18 0603 04/05/18 0715 04/05/18 1413  NA 130* 129* 128* 131* 132*  --   K 4.1 3.8 4.6 5.6* 5.6* 4.4  CL 86* 89* 90* 92* 87*  --   CO2 30 29 27 29  32  --   GLUCOSE 81 133* 243* 151* 231*  --   BUN 11 15 14 16  22*  --   CREATININE 0.45 0.36* 0.63 0.44 0.56  --   CALCIUM 9.6 8.4* 8.8* 9.1 9.2  --    Liver Function Tests: Recent Labs  Lab 04/01/18 1218  AST 20  ALT 10*  ALKPHOS 25*  BILITOT 0.4  PROT 7.4  ALBUMIN 3.5   No results for input(s): LIPASE, AMYLASE in the last 168 hours. No results for input(s): AMMONIA in the last 168 hours. CBC: Recent Labs  Lab 04/01/18 1218 04/02/18 0512 04/03/18 0936 04/04/18 0603 04/05/18 0715  WBC 9.8  --  9.1 10.6* 10.4  NEUTROABS 6.4  --   --   --   --   HGB 9.9* 8.8* 8.8* 8.2* 9.4*  HCT 31.3* 27.1* 27.4* 26.2* 29.7*  MCV 81.3  --  81.8 80.9 81.8  PLT 476*  --  381 423* 464*   Cardiac Enzymes: Recent Labs  Lab 04/01/18 1218  TROPONINI <0.03   CBG (last 3)  Recent Labs    04/04/18 0731 04/05/18 0500 04/05/18 0736  GLUCAP 138* 235* 199*   Recent Results (from the past 240 hour(s))  Urine Culture     Status: Abnormal   Collection Time: 04/01/18 12:13 PM  Result Value Ref Range Status   Specimen Description   Final    URINE, CLEAN CATCH Performed at [facility], [address], [place], [place] [address]    Special Requests   Final    NONE Performed at [facility], [address] [place], [place] [address]    Culture >=100,000 COLONIES/mL ESCHERICHIA COLI (A)  Final   Report Status 04/04/2018 FINAL  Final   Organism ID, Bacteria ESCHERICHIA COLI (A)  Final      Susceptibility   Escherichia coli - MIC*    AMPICILLIN  16 INTERMEDIATE Intermediate     CEFAZOLIN <=4 SENSITIVE Sensitive     CEFTRIAXONE <=1 SENSITIVE Sensitive     CIPROFLOXACIN >=4 RESISTANT Resistant     GENTAMICIN <=1 SENSITIVE Sensitive     IMIPENEM <=0.25 SENSITIVE Sensitive     NITROFURANTOIN <=16 SENSITIVE Sensitive     TRIMETH/SULFA <=20 SENSITIVE Sensitive     AMPICILLIN/SULBACTAM 4 SENSITIVE Sensitive     PIP/TAZO <=4 SENSITIVE Sensitive     Extended ESBL NEGATIVE Sensitive     * >=100,000 COLONIES/mL ESCHERICHIA COLI     Studies: No results found.   Scheduled Meds: . amLODipine  10 mg Oral Daily  . atorvastatin  40 mg  Oral Daily  . azithromycin  250 mg Oral Daily  . citalopram  20 mg Oral Daily  . feeding supplement (ENSURE ENLIVE)  237 mL Oral BID BM  . fenofibrate  160 mg Oral Daily  . guaiFENesin  1,200 mg Oral BID  . ipratropium-albuterol  3 mL Nebulization Q4H  . methylPREDNISolone (SOLU-MEDROL) injection  60 mg Intravenous Q8H  . metoprolol tartrate  25 mg Oral BID  . milk and molasses  1 enema Rectal Once  . mometasone-formoterol  2 puff Inhalation BID  . nicotine  21 mg Transdermal Daily  . polyethylene glycol  17 g Oral Daily   Continuous Infusions: . cefTRIAXone (ROCEPHIN)  IV Stopped (04/05/18 1809)    Active Problems:   HTN (hypertension)   DM (diabetes mellitus) ([place])   COPD with acute exacerbation (HCC)   Protein-calorie malnutrition, severe   Kathie Dike, MD [facility] Pager [phone]  If 7PM-7AM, please contact night-coverage [email or web address] Password [email or web address] 04/05/2018, 7:19 PM    LOS: 0 days

## 2018-04-05 NOTE — Progress Notes (Signed)
Inpatient Diabetes Program Recommendations  AACE/ADA: New Consensus Statement on Inpatient Glycemic Control (2015)  Target Ranges:  Prepandial:   less than 140 mg/dL      Peak postprandial:   less than 180 mg/dL (1-2 hours)      Critically ill patients:  140 - 180 mg/dL   Lab Results  Component Value Date   GLUCAP 199 (H) 04/05/2018   HGBA1C 5.3 04/01/2018    Review of Glycemic Control  Diabetes history: DM2 Outpatient Diabetes medications: Metformin 1 gm q am + 500 mg q pm Current orders for Inpatient glycemic control: None  Inpatient Diabetes Program Recommendations:   Noted patient's A1c 5.3 and weight 100 lbs. -Agree with note regarding D/C of Metformin on discharge. -Novolog correction sensitive while on steroids in the hospital  Thank you, Nani Gasser. Neamiah Sciarra, RN, MSN, CDE  Diabetes Coordinator Inpatient Glycemic Control Team Team Pager [phone] (8am-5pm) 04/05/2018 9:37 AM

## 2018-04-05 NOTE — Progress Notes (Signed)
Radial manual pulse while sleeping 108.  Texted Dr. Roderic Palau

## 2018-04-05 NOTE — Care Management Note (Signed)
Case Management Note  Patient Details  Name: Jacqueline Cervantes MRN: [id number] Date of Birth: 20-Oct-1935  Subjective/Objective:      Admitted with HTN and COPD. Pt from home, lives with husband and daughter. Has cane and walker to use if needed. She has the ability to have 24/7 supervision.               Action/Plan: PT recommends HH PT. Pt declines referral at this time. She has no had HH in the past. We discuss option of OP PT and pt declines that as well. Pt aware she can change her mind at any time before discharge.   Expected Discharge Date:  04/03/18               Expected Discharge Plan:  Home/Self Care  In-House Referral:  NA  Discharge planning Services  CM Consult  Post Acute Care Choice:  NA Choice offered to:  NA  Status of Service:  Completed, signed off  If discussed at Long Length of Stay Meetings, dates discussed:    Additional Comments:  Sherald Barge, RN 04/05/2018, 3:05 PM

## 2018-04-05 NOTE — Progress Notes (Signed)
Physical Therapy Treatment Patient Details Name: Jacqueline Cervantes MRN: [id number] DOB: [birth year] Today's Date: 04/05/2018    History of Present Illness Jacqueline Cervantes is a 83 y.o. female with medical history significant for HTN, DM, HLD, current tobacco abuse, severe COPD on chronic 2 L O2 at home, left breast cancer hx. History is obtained mostly from chart review, and patient's daughter-in-law, as patient answers a few of my questions appropriately but cannot remember the symptoms or details of what brought her to the ED.     PT Comments    Pt received on BSC, supervised safe ADL performance, and assisted pt to return to bed. Pt agreeable to attempt AMB in room. Dramatic HR increase to 128BPM after very slow AMB to door and back to bed. Pt reports SOB, but SpO98% on 2.5L/min. Attempted transfers training at bedside but limited to only 3 prior to repeat symptoms. Pt asks to lay down. Activity tolerance questionably equal or worse than prior PT sessions this admission. Pt also with noted LOB in room turning. Will continue to follow acutely.    Follow Up Recommendations  Supervision for mobility/OOB;Home health PT(impaired balance, high risk of falling))     Equipment Recommendations  None recommended by PT    Recommendations for Other Services       Precautions / Restrictions Precautions Precautions: Fall    Mobility  Bed Mobility Overal bed mobility: Modified Independent                Transfers Overall transfer level: Needs assistance Equipment used: 1 person hand held assist Transfers: Sit to/from [facility] Sit to Stand: Min guard Stand pivot transfers: Min guard          Ambulation/Gait Ambulation/Gait assistance: Min assist Ambulation Distance (Feet): 20 Feet Assistive device: 1 person hand held assist   Gait velocity: 0.18m/s    General Gait Details: very slow antalgic, LOB with turns. HR increases to 128bpm after 5ft AMB in room.     Stairs            Wheelchair Mobility    Modified Rankin (Stroke Patients Only)       Balance Overall balance assessment: Needs assistance         Standing balance support: During functional activity;Single extremity supported Standing balance-Leahy Scale: Poor                              Cognition Arousal/Alertness: Awake/alert Behavior During Therapy: WFL for tasks assessed/performed Overall Cognitive Status: Within Functional Limits for tasks assessed                                        Exercises Other Exercises Other Exercises: aattempted 5x STS from bed, but only able to tolerate 3x    General Comments        Pertinent Vitals/Pain Pain Assessment: No/denies pain    Home Living                      Prior Function            PT Goals (current goals can now be found in the care plan section) Acute Rehab PT Goals Patient Stated Goal: Return home PT Goal Formulation: With patient Time For Goal Achievement: 04/06/18 Potential to Achieve Goals: Good Progress towards PT goals: Not  progressing toward goals - comment    Frequency    Min 3X/week      PT Plan Current plan remains appropriate    Co-evaluation              AM-PAC PT "6 Clicks" Daily Activity  Outcome Measure  Difficulty turning over in bed (including adjusting bedclothes, sheets and blankets)?: None Difficulty moving from lying on back to sitting on the side of the bed? : None Difficulty sitting down on and standing up from a chair with arms (e.g., wheelchair, bedside commode, etc,.)?: A Little Help needed moving to and from a bed to chair (including a wheelchair)?: None Help needed walking in hospital room?: A Lot Help needed climbing 3-5 steps with a railing? : A Lot 6 Click Score: 19    End of Session Equipment Utilized During Treatment: Oxygen Activity Tolerance: Patient limited by fatigue;Treatment limited secondary to  medical complications (Comment)(Hr increases dramatically with minimal acttivity ) Patient left: in bed;with call bell/phone within reach;with bed alarm set Nurse Communication: Mobility status;Other (comment) PT Visit Diagnosis: Unsteadiness on feet (R26.81);Other abnormalities of gait and mobility (R26.89);Muscle weakness (generalized) (M62.81)     Time: 2202-5427 PT Time Calculation (min) (ACUTE ONLY): 10 min  Charges:  $Therapeutic Activity: 8-22 mins                    G Codes:       1:34 PM, Apr 27, 2018 Etta Grandchild, PT, DPT Physical Therapist - [place] [phone] (Office)    Jahlani Lorentz C Apr 27, 2018, 1:32 PM

## 2018-04-05 NOTE — Progress Notes (Signed)
Texted Dr. Roderic Palau concerning pulse 128 on ambulating with PT

## 2018-04-06 DIAGNOSIS — Z9981 Dependence on supplemental oxygen: Secondary | ICD-10-CM | POA: Diagnosis not present

## 2018-04-06 DIAGNOSIS — E871 Hypo-osmolality and hyponatremia: Secondary | ICD-10-CM | POA: Diagnosis present

## 2018-04-06 DIAGNOSIS — F17210 Nicotine dependence, cigarettes, uncomplicated: Secondary | ICD-10-CM | POA: Diagnosis not present

## 2018-04-06 DIAGNOSIS — Z7983 Long term (current) use of bisphosphonates: Secondary | ICD-10-CM | POA: Diagnosis not present

## 2018-04-06 DIAGNOSIS — I10 Essential (primary) hypertension: Secondary | ICD-10-CM | POA: Diagnosis not present

## 2018-04-06 DIAGNOSIS — E119 Type 2 diabetes mellitus without complications: Secondary | ICD-10-CM | POA: Diagnosis not present

## 2018-04-06 DIAGNOSIS — Z9842 Cataract extraction status, left eye: Secondary | ICD-10-CM | POA: Diagnosis not present

## 2018-04-06 DIAGNOSIS — E43 Unspecified severe protein-calorie malnutrition: Secondary | ICD-10-CM | POA: Diagnosis not present

## 2018-04-06 DIAGNOSIS — Z7951 Long term (current) use of inhaled steroids: Secondary | ICD-10-CM | POA: Diagnosis not present

## 2018-04-06 DIAGNOSIS — B9620 Unspecified Escherichia coli [E. coli] as the cause of diseases classified elsewhere: Secondary | ICD-10-CM | POA: Diagnosis not present

## 2018-04-06 DIAGNOSIS — K5900 Constipation, unspecified: Secondary | ICD-10-CM | POA: Diagnosis not present

## 2018-04-06 DIAGNOSIS — Z85828 Personal history of other malignant neoplasm of skin: Secondary | ICD-10-CM | POA: Diagnosis not present

## 2018-04-06 DIAGNOSIS — J441 Chronic obstructive pulmonary disease with (acute) exacerbation: Secondary | ICD-10-CM | POA: Diagnosis not present

## 2018-04-06 DIAGNOSIS — Z681 Body mass index (BMI) 19 or less, adult: Secondary | ICD-10-CM | POA: Diagnosis not present

## 2018-04-06 DIAGNOSIS — Z90710 Acquired absence of both cervix and uterus: Secondary | ICD-10-CM | POA: Diagnosis not present

## 2018-04-06 DIAGNOSIS — J9620 Acute and chronic respiratory failure, unspecified whether with hypoxia or hypercapnia: Secondary | ICD-10-CM | POA: Diagnosis not present

## 2018-04-06 DIAGNOSIS — N390 Urinary tract infection, site not specified: Secondary | ICD-10-CM | POA: Diagnosis not present

## 2018-04-06 DIAGNOSIS — E875 Hyperkalemia: Secondary | ICD-10-CM | POA: Diagnosis not present

## 2018-04-06 DIAGNOSIS — E222 Syndrome of inappropriate secretion of antidiuretic hormone: Secondary | ICD-10-CM | POA: Diagnosis not present

## 2018-04-06 DIAGNOSIS — Z9841 Cataract extraction status, right eye: Secondary | ICD-10-CM | POA: Diagnosis not present

## 2018-04-06 LAB — GLUCOSE, CAPILLARY: Glucose-Capillary: 112 mg/dL — ABNORMAL HIGH (ref 65–99)

## 2018-04-06 MED ORDER — ALPRAZOLAM 0.25 MG PO TABS
0.2500 mg | ORAL_TABLET | Freq: Once | ORAL | Status: AC
  Administered 2018-04-06: 0.25 mg via ORAL
  Filled 2018-04-06: qty 1

## 2018-04-06 MED ORDER — PREDNISONE 20 MG PO TABS
50.0000 mg | ORAL_TABLET | Freq: Two times a day (BID) | ORAL | Status: DC
  Administered 2018-04-06 – 2018-04-07 (×2): 50 mg via ORAL
  Filled 2018-04-06 (×2): qty 2

## 2018-04-06 NOTE — Progress Notes (Signed)
Physical Therapy Treatment Patient Details Name: Jacqueline Cervantes MRN: [id number] DOB: Oct 29, 1935 Today's Date: 04/06/2018    History of Present Illness Jacqueline Cervantes is a 83 y.o. female with medical history significant for HTN, DM, HLD, current tobacco abuse, severe COPD on chronic 2 L O2 at home, left breast cancer.  She presents to ED complaining of progressive SOB for weeks leading to severe debility.  She had been so SOB she could barely ambulate to the bathroom.  She was admitted with COPD exacerbation.  She has been receiving antibiotics, steroids and bronchodilators.  She had slow improvement.  Respiratory status appears to be approaching baseline.  She was also noted to have sinus tachycardia and was started on beta-blockers.  If heart rate remained stable overnight, anticipate discharge home in a.m.    PT Comments    Pt admitted with above diagnosis. Pt currently with functional limitations due to the deficits listed below (see PT Problem List). Patient ambulated 24 feet without AD with LOB requiring PT assistance to recover. Patient ambulated 24 feet with RW and min guard with minor LOB with independent recovery. HR 93 bpm supine, 94 bpm in sitting, max 103 bpm during ambulation in room on 2L O2. O2 sats 93-99% during all activities.  Pt will benefit from skilled PT to increase their independence and safety with mobility to allow discharge to the venue listed below.      Follow Up Recommendations  Supervision for mobility/OOB;Home health PT(impaired balance, high rish of falling; patient refusing HHPT)     Equipment Recommendations  None recommended by PT    Recommendations for Other Services       Precautions / Restrictions Precautions Precautions: Fall Restrictions Weight Bearing Restrictions: No    Mobility  Bed Mobility Overal bed mobility: Modified Independent                Transfers Overall transfer level: Needs assistance Equipment used: Rolling walker (2  wheeled) Transfers: Sit to/from [facility] Sit to Stand: Min guard Stand pivot transfers: Min guard       General transfer comment: stand pivot transfer without assistive device patient had minor LOB requiring therapist's assistance to correct  Ambulation/Gait Ambulation/Gait assistance: Min guard Ambulation Distance (Feet): 48 Feet(24x2) Assistive device: Rolling walker (2 wheeled) Gait Pattern/deviations: Decreased step length - right;Decreased step length - left;Decreased stride length;Trunk flexed   Gait velocity interpretation: <1.31 ft/sec, indicative of household ambulator General Gait Details: very slow gait, LOB with turns especially without assistive device. HR 93 bpm supine, 94 bpm in sitting, max 103 bpm during ambulation in room on 2L O2. O2 sats 93-99% during all activities.   Stairs             Wheelchair Mobility    Modified Rankin (Stroke Patients Only)       Balance                                            Cognition Arousal/Alertness: Awake/alert Behavior During Therapy: WFL for tasks assessed/performed Overall Cognitive Status: Within Functional Limits for tasks assessed                                        Exercises      General Comments  Pertinent Vitals/Pain Pain Assessment: No/denies pain    Home Living Family/patient expects to be discharged to:: Private residence Living Arrangements: Spouse/significant other;Children Available Help at Discharge: Family Type of Home: Mobile home Home Access: Stairs to enter;Ramped entrance Entrance Stairs-Rails: Right;Left;Can reach both Home Layout: One level Home Equipment: Environmental consultant - 4 wheels;Cane - single point;Shower seat;Walker - 2 wheels      Prior Function Level of Independence: Independent      Comments: independent for household distances, Home O2 dependent   PT Goals (current goals can now be found in the care plan  section) Acute Rehab PT Goals Patient Stated Goal: Return home PT Goal Formulation: With patient Time For Goal Achievement: 04/06/18 Potential to Achieve Goals: Good Progress towards PT goals: Progressing toward goals    Frequency    Min 3X/week      PT Plan      Co-evaluation              AM-PAC PT "6 Clicks" Daily Activity  Outcome Measure  Difficulty turning over in bed (including adjusting bedclothes, sheets and blankets)?: None Difficulty moving from lying on back to sitting on the side of the bed? : None Difficulty sitting down on and standing up from a chair with arms (e.g., wheelchair, bedside commode, etc,.)?: A Little Help needed moving to and from a bed to chair (including a wheelchair)?: A Little Help needed walking in hospital room?: A Little Help needed climbing 3-5 steps with a railing? : A Little 6 Click Score: 20    End of Session Equipment Utilized During Treatment: Oxygen Activity Tolerance: Patient limited by fatigue Patient left: in bed;with call bell/phone within reach;with bed alarm set Nurse Communication: Mobility status;Other (comment) PT Visit Diagnosis: Unsteadiness on feet (R26.81);Other abnormalities of gait and mobility (R26.89);Muscle weakness (generalized) (M62.81)     Time: 4097-3532 PT Time Calculation (min) (ACUTE ONLY): 19 min  Charges:  $Gait Training: 8-22 mins                    G Codes:       Claude Swendsen D. Hartnett-Rands, MS, PT [facility] [place] [phone] 04/06/2018, 11:55 AM

## 2018-04-06 NOTE — Progress Notes (Signed)
PROGRESS NOTE    Jacqueline Cervantes  LOV:[id number]  DOB: 06/05/1935  DOA: 04/01/2018 PCP: [name], FNP   Brief Admission Hx: Jacqueline Cervantes is a 83 y.o. female with medical history significant for HTN, DM, HLD, current tobacco abuse, severe COPD on chronic 2 L O2 at home, left breast cancer.  She presents to ED complaining of progressive SOB for weeks leading to severe debility.  She had been so SOB she could barely ambulate to the bathroom.  She was admitted with COPD exacerbation.  She has been receiving antibiotics, steroids and bronchodilators.  She had slow improvement.  Patient has had some slow improvement although still somewhat tachycardic.  This is a little bit better with beta blockers.  Today, patient complains of persistent wheezing and dyspnea, not yet back at baseline.  She does admit that at times she gets choked up when she eats.  MDM/Assessment & Plan:   1. Acute COPD exacerbation -patient has oxygen dependent COPD and is chronically on 2 L.  Respiratory status has slowly improved and appears to be approaching baseline.  Influenza panel is negative.  Continue on bronchodilators and pulmonary hygiene.  She is completed a course of azithromycin, will discontinue.  Change over to prednisone yesterday.  Will increase dose to 50 mg p.o. twice daily.  Given reports of choking while eating, will check swallow evaluation. 2. UTI -urine culture positive for E. coli.  Treated with ceftriaxone IV.  We will discontinue further antibiotics since she is been adequately treated. 3. Anemia, unspecified -hemoglobin has had a decline since admission, no obvious bleeding.  Anemia panel indicates chronic disease.  Continue to monitor.  B12 was also on the lower range of normal and was replaced.  Continue to follow.  Follow-up hemoglobins have been stable. 4. Hyponatremia -urine sodium/serum osmolarity indicate SIADH, likely related to COPD. She has had mild improvement with IV fluids.   Continue to monitor.   5. Hyperkalemia.  ARB has been discontinued.  Improved after receiving Kayexalate 6. Essential hypertension - blood pressures stable, resumed home medications with the exception of ARB.   7. Type 2 Diabetes Mellitus - monitoring blood sugars but no insulin ordered for now.  A1c 5.7%. 8. Constipation.  Improved with MiraLAX 9. Tachycardia.  Heart rate gets in the 120s-130s on ambulation.  Resting heart rate in the 110 range.  EKG shows sinus tachycardia.  Monitor on telemetry overnight.  She is been started on low-dose beta-blocker.  Does not appear septic or toxic or dehydrated.  DVT prophylaxis: SCDs Code Status: Full  Family Communication: Left message for daughter Disposition Plan: Discharge hopefully next 24-48 hours once breathing closer to baseline, swallowing evaluated.    Objective: Vitals:   04/06/18 0530 04/06/18 0729 04/06/18 1200 04/06/18 1421  BP: 138/62   (!) 144/58  Pulse: 99   99  Resp:    20  Temp: 98.7 F (37.1 C)   98.4 F (36.9 C)  TempSrc: Oral   Oral  SpO2: 100% 98% 99% 100%  Weight:      Height:        Intake/Output Summary (Last 24 hours) at 04/06/2018 1722 Last data filed at 04/06/2018 1250 Gross per 24 hour  Intake 1280 ml  Output 1200 ml  Net 80 ml   Filed Weights   04/01/18 1208 04/01/18 1214 04/01/18 1800  Weight: 45.4 kg (100 lb) 52.2 kg (115 lb) 45.6 kg (100 lb 8.5 oz)     REVIEW OF SYSTEMS  As  per history otherwise all reviewed and reported negative  Exam:  General exam: Alert, awake, oriented x 3 HEENT: Normocephalic and atraumatic, mucous membranes are slightly dry Neck: Supple, no JVD Respiratory system: Bilateral expiratory wheezing, decreased breath sounds throughout Cardiovascular system: Mild tachycardia, worse with ambulation Gastrointestinal system: Soft, distended, nontender, positive bowel sounds Central nervous system: No focal deficits Extremities: No clubbing or cyanosis or edema Skin: No  rashes, lesions or ulcers Psychiatry: Appropriate, no evidence of psychoses  Data Reviewed: Basic Metabolic Panel: Recent Labs  Lab 04/01/18 1218 04/02/18 0512 04/03/18 0936 04/04/18 0603 04/05/18 0715 04/05/18 1413  NA 130* 129* 128* 131* 132*  --   K 4.1 3.8 4.6 5.6* 5.6* 4.4  CL 86* 89* 90* 92* 87*  --   CO2 30 29 27 29  32  --   GLUCOSE 81 133* 243* 151* 231*  --   BUN 11 15 14 16  22*  --   CREATININE 0.45 0.36* 0.63 0.44 0.56  --   CALCIUM 9.6 8.4* 8.8* 9.1 9.2  --    Liver Function Tests: Recent Labs  Lab 04/01/18 1218  AST 20  ALT 10*  ALKPHOS 25*  BILITOT 0.4  PROT 7.4  ALBUMIN 3.5   No results for input(s): LIPASE, AMYLASE in the last 168 hours. No results for input(s): AMMONIA in the last 168 hours. CBC: Recent Labs  Lab 04/01/18 1218 04/02/18 0512 04/03/18 0936 04/04/18 0603 04/05/18 0715  WBC 9.8  --  9.1 10.6* 10.4  NEUTROABS 6.4  --   --   --   --   HGB 9.9* 8.8* 8.8* 8.2* 9.4*  HCT 31.3* 27.1* 27.4* 26.2* 29.7*  MCV 81.3  --  81.8 80.9 81.8  PLT 476*  --  381 423* 464*   Cardiac Enzymes: Recent Labs  Lab 04/01/18 1218  TROPONINI <0.03   CBG (last 3)  Recent Labs    04/05/18 0500 04/05/18 0736 04/06/18 0729  GLUCAP 235* 199* 112*   Recent Results (from the past 240 hour(s))  Urine Culture     Status: Abnormal   Collection Time: 04/01/18 12:13 PM  Result Value Ref Range Status   Specimen Description   Final    URINE, CLEAN CATCH Performed at [facility], [address], [place], [place] [address]    Special Requests   Final    NONE Performed at [facility], [address], [place], [place] [address]    Culture >=100,000 COLONIES/mL ESCHERICHIA COLI (A)  Final   Report Status 04/04/2018 FINAL  Final   Organism ID, Bacteria ESCHERICHIA COLI (A)  Final      Susceptibility   Escherichia coli - MIC*    AMPICILLIN 16 INTERMEDIATE Intermediate     CEFAZOLIN <=4 SENSITIVE Sensitive     CEFTRIAXONE <=1 SENSITIVE Sensitive      CIPROFLOXACIN >=4 RESISTANT Resistant     GENTAMICIN <=1 SENSITIVE Sensitive     IMIPENEM <=0.25 SENSITIVE Sensitive     NITROFURANTOIN <=16 SENSITIVE Sensitive     TRIMETH/SULFA <=20 SENSITIVE Sensitive     AMPICILLIN/SULBACTAM 4 SENSITIVE Sensitive     PIP/TAZO <=4 SENSITIVE Sensitive     Extended ESBL NEGATIVE Sensitive     * >=100,000 COLONIES/mL ESCHERICHIA COLI     Studies: No results found.   Scheduled Meds: . amLODipine  10 mg Oral Daily  . atorvastatin  40 mg Oral Daily  . citalopram  20 mg Oral Daily  . feeding supplement (ENSURE ENLIVE)  237 mL Oral  BID BM  . fenofibrate  160 mg Oral Daily  . guaiFENesin  1,200 mg Oral BID  . ipratropium-albuterol  3 mL Nebulization Q4H  . metoprolol tartrate  25 mg Oral BID  . mometasone-formoterol  2 puff Inhalation BID  . nicotine  21 mg Transdermal Daily  . polyethylene glycol  17 g Oral Daily  . predniSONE  50 mg Oral BID WC   Continuous Infusions:   Active Problems:   HTN (hypertension)   DM (diabetes mellitus) ([place])   COPD with acute exacerbation ([place])   Protein-calorie malnutrition, severe   COPD exacerbation ([place])   Annita Brod, MD [facility] Pager [phone]  If 7PM-7AM, please contact night-coverage [email or web address] Password [email or web address] 04/06/2018, 5:22 PM    LOS: 0 days

## 2018-04-07 DIAGNOSIS — E43 Unspecified severe protein-calorie malnutrition: Secondary | ICD-10-CM | POA: Diagnosis not present

## 2018-04-07 DIAGNOSIS — Z85828 Personal history of other malignant neoplasm of skin: Secondary | ICD-10-CM | POA: Diagnosis not present

## 2018-04-07 DIAGNOSIS — E875 Hyperkalemia: Secondary | ICD-10-CM | POA: Diagnosis not present

## 2018-04-07 DIAGNOSIS — Z7983 Long term (current) use of bisphosphonates: Secondary | ICD-10-CM | POA: Diagnosis not present

## 2018-04-07 DIAGNOSIS — Z9841 Cataract extraction status, right eye: Secondary | ICD-10-CM | POA: Diagnosis not present

## 2018-04-07 DIAGNOSIS — E089 Diabetes mellitus due to underlying condition without complications: Secondary | ICD-10-CM | POA: Diagnosis not present

## 2018-04-07 DIAGNOSIS — J9620 Acute and chronic respiratory failure, unspecified whether with hypoxia or hypercapnia: Secondary | ICD-10-CM | POA: Diagnosis not present

## 2018-04-07 DIAGNOSIS — Z9981 Dependence on supplemental oxygen: Secondary | ICD-10-CM | POA: Diagnosis not present

## 2018-04-07 DIAGNOSIS — E871 Hypo-osmolality and hyponatremia: Secondary | ICD-10-CM | POA: Diagnosis not present

## 2018-04-07 DIAGNOSIS — Z7951 Long term (current) use of inhaled steroids: Secondary | ICD-10-CM | POA: Diagnosis not present

## 2018-04-07 DIAGNOSIS — J441 Chronic obstructive pulmonary disease with (acute) exacerbation: Secondary | ICD-10-CM | POA: Diagnosis not present

## 2018-04-07 DIAGNOSIS — E119 Type 2 diabetes mellitus without complications: Secondary | ICD-10-CM | POA: Diagnosis not present

## 2018-04-07 DIAGNOSIS — B9620 Unspecified Escherichia coli [E. coli] as the cause of diseases classified elsewhere: Secondary | ICD-10-CM | POA: Diagnosis not present

## 2018-04-07 DIAGNOSIS — I10 Essential (primary) hypertension: Secondary | ICD-10-CM | POA: Diagnosis not present

## 2018-04-07 DIAGNOSIS — K5900 Constipation, unspecified: Secondary | ICD-10-CM | POA: Diagnosis not present

## 2018-04-07 DIAGNOSIS — N390 Urinary tract infection, site not specified: Secondary | ICD-10-CM

## 2018-04-07 DIAGNOSIS — Z9842 Cataract extraction status, left eye: Secondary | ICD-10-CM | POA: Diagnosis not present

## 2018-04-07 DIAGNOSIS — E222 Syndrome of inappropriate secretion of antidiuretic hormone: Secondary | ICD-10-CM | POA: Diagnosis not present

## 2018-04-07 LAB — GLUCOSE, CAPILLARY: Glucose-Capillary: 100 mg/dL — ABNORMAL HIGH (ref 65–99)

## 2018-04-07 MED ORDER — DIPHENHYDRAMINE HCL 25 MG PO CAPS
25.0000 mg | ORAL_CAPSULE | Freq: Three times a day (TID) | ORAL | Status: AC | PRN
  Administered 2018-04-07 (×2): 25 mg via ORAL
  Filled 2018-04-07 (×2): qty 1

## 2018-04-07 MED ORDER — PREDNISONE 20 MG PO TABS
50.0000 mg | ORAL_TABLET | Freq: Every day | ORAL | Status: DC
  Administered 2018-04-08: 08:00:00 50 mg via ORAL
  Filled 2018-04-07: qty 2

## 2018-04-07 MED ORDER — IPRATROPIUM-ALBUTEROL 0.5-2.5 (3) MG/3ML IN SOLN
3.0000 mL | RESPIRATORY_TRACT | Status: DC
  Administered 2018-04-08 (×4): 3 mL via RESPIRATORY_TRACT
  Filled 2018-04-07 (×4): qty 3

## 2018-04-07 NOTE — Progress Notes (Signed)
Patient is asleep have asked nurse to notifie RT for neb.

## 2018-04-07 NOTE — Progress Notes (Signed)
[facility] PROGRESS NOTE  Jacqueline Cervantes GGE:[id number] DOB: [birth year] DOA: 04/01/2018 PCP: [name], FNP  Brief summary   Jacqueline Perry Helmsis a 83 y.o.femalewith medical history significantfor HTN, DM, HLD, currenttobacco abuse,severe COPD on chronic 2 L O2 at home,left breast cancer.  She presents to ED complaining of progressive SOB for weeks leading to severe debility.  She had been so SOB she could barely ambulate to the bathroom.  She was admitted with COPD exacerbation.  She has been receiving antibiotics, steroids and bronchodilators.  She had slow improvement.  Patient has had some slow improvement although still somewhat tachycardic.  This is a little bit better with beta blockers.   Assessment/Plan:  Acute COPD exacerbation -patient has oxygen dependent COPD and is chronically on 2 L. Respiratory status has slowly improving not at baseline.  Influenza panel is negative.  Continue on bronchodilators and pulmonary hygiene.  She is completed a course of azithromycin.  Changed over to prednisone, cont at 50 mg p.o. twice daily.  Given reports of choking while eating, check swallow evaluation.  UTI -urine culture positive for E. coli.  Treated with ceftriaxone IV.    Anemia, unspecified -hemoglobin has had a decline since admission, no obvious bleeding.  Anemia panel indicates chronic disease.  Continue to monitor.  B12 was also on the lower range of normal and was replaced.  Continue to follow.  Follow-up hemoglobins have been stable.  Hyponatremia -urine sodium/serum osmolarity indicate SIADH, likely related to COPD. She has had mild improvement with IV fluids.  Continue to monitor.    Hyperkalemia.  ARB has been discontinued.  Improved after receiving Kayexalate  Essential hypertension - blood pressures stable, resumed home medications with the exception of ARB.    Type 2 Diabetes Mellitus - monitoring blood sugars but no insulin ordered for now.  A1c  5.7%.  Constipation.  Improved with MiraLAX  Tachycardia.  Heart rate gets in the 120s-130s on ambulation.  Resting heart rate in the 110 range.  EKG shows sinus tachycardia.  Monitor on telemetry overnight.  She is been started on low-dose beta-blocker.  Does not appear septic or toxic or dehydrated.    Code Status: full Family Communication:  D/w patient,. RN (indicate person spoken with, relationship, and if by phone, the number) Disposition Plan: home in 24-48 hrs    Consultants:  none  Procedures:  none  Antibiotics: Antibiotics Given (last 72 hours)    Date/Time Action Medication Dose Rate   04/04/18 1529 New Bag/Given   cefTRIAXone (ROCEPHIN) 1 g in sodium chloride 0.9 % 100 mL IVPB 1 g 200 mL/hr   04/05/18 4650 Given   azithromycin (ZITHROMAX) tablet 250 mg 250 mg    04/05/18 1739 New Bag/Given   cefTRIAXone (ROCEPHIN) 1 g in sodium chloride 0.9 % 100 mL IVPB 1 g 200 mL/hr        (indicate start date, and stop date if known)  HPI/Subjective: Alert, still wheezing. Cough, productive sputum   Objective: Vitals:   04/07/18 0725 04/07/18 0730  BP:    Pulse:    Resp:    Temp:    SpO2: 93% 93%    Intake/Output Summary (Last 24 hours) at 04/07/2018 1105 Last data filed at 04/07/2018 0828 Gross per 24 hour  Intake 480 ml  Output 400 ml  Net 80 ml   Filed Weights   04/01/18 1208 04/01/18 1214 04/01/18 1800  Weight: 45.4 kg (100 lb) 52.2 kg (115 lb) 45.6 kg (100 lb 8.5  oz)    Exam:   General:  Coughing   Cardiovascular: s1,s2 rrr  Respiratory: wheezing   Abdomen: soft, nt  Musculoskeletal: no leg edema    Data Reviewed: Basic Metabolic Panel: Recent Labs  Lab 04/01/18 1218 04/02/18 0512 04/03/18 0936 04/04/18 0603 04/05/18 0715 04/05/18 1413  NA 130* 129* 128* 131* 132*  --   K 4.1 3.8 4.6 5.6* 5.6* 4.4  CL 86* 89* 90* 92* 87*  --   CO2 30 29 27 29  32  --   GLUCOSE 81 133* 243* 151* 231*  --   BUN 11 15 14 16  22*  --   CREATININE  0.45 0.36* 0.63 0.44 0.56  --   CALCIUM 9.6 8.4* 8.8* 9.1 9.2  --    Liver Function Tests: Recent Labs  Lab 04/01/18 1218  AST 20  ALT 10*  ALKPHOS 25*  BILITOT 0.4  PROT 7.4  ALBUMIN 3.5   No results for input(s): LIPASE, AMYLASE in the last 168 hours. No results for input(s): AMMONIA in the last 168 hours. CBC: Recent Labs  Lab 04/01/18 1218 04/02/18 0512 04/03/18 0936 04/04/18 0603 04/05/18 0715  WBC 9.8  --  9.1 10.6* 10.4  NEUTROABS 6.4  --   --   --   --   HGB 9.9* 8.8* 8.8* 8.2* 9.4*  HCT 31.3* 27.1* 27.4* 26.2* 29.7*  MCV 81.3  --  81.8 80.9 81.8  PLT 476*  --  381 423* 464*   Cardiac Enzymes: Recent Labs  Lab 04/01/18 1218  TROPONINI <0.03   BNP (last 3 results) No results for input(s): BNP in the last 8760 hours.  ProBNP (last 3 results) No results for input(s): PROBNP in the last 8760 hours.  CBG: Recent Labs  Lab 04/04/18 0731 04/05/18 0500 04/05/18 0736 04/06/18 0729 04/07/18 0724  GLUCAP 138* 235* 199* 112* 100*    Recent Results (from the past 240 hour(s))  Urine Culture     Status: Abnormal   Collection Time: 04/01/18 12:13 PM  Result Value Ref Range Status   Specimen Description   Final    URINE, CLEAN CATCH Performed at [facility], [address] [place], [place] [address]    Special Requests   Final    NONE Performed at [facility], [address], [place], [place] [address]    Culture >=100,000 COLONIES/mL ESCHERICHIA COLI (A)  Final   Report Status 04/04/2018 FINAL  Final   Organism ID, Bacteria ESCHERICHIA COLI (A)  Final      Susceptibility   Escherichia coli - MIC*    AMPICILLIN 16 INTERMEDIATE Intermediate     CEFAZOLIN <=4 SENSITIVE Sensitive     CEFTRIAXONE <=1 SENSITIVE Sensitive     CIPROFLOXACIN >=4 RESISTANT Resistant     GENTAMICIN <=1 SENSITIVE Sensitive     IMIPENEM <=0.25 SENSITIVE Sensitive     NITROFURANTOIN <=16 SENSITIVE Sensitive     TRIMETH/SULFA <=20 SENSITIVE Sensitive     AMPICILLIN/SULBACTAM  4 SENSITIVE Sensitive     PIP/TAZO <=4 SENSITIVE Sensitive     Extended ESBL NEGATIVE Sensitive     * >=100,000 COLONIES/mL ESCHERICHIA COLI     Studies: No results found.  Scheduled Meds: . amLODipine  10 mg Oral Daily  . atorvastatin  40 mg Oral Daily  . citalopram  20 mg Oral Daily  . feeding supplement (ENSURE ENLIVE)  237 mL Oral BID BM  . fenofibrate  160 mg Oral Daily  . guaiFENesin  1,200 mg Oral BID  .  ipratropium-albuterol  3 mL Nebulization Q4H  . metoprolol tartrate  25 mg Oral BID  . mometasone-formoterol  2 puff Inhalation BID  . nicotine  21 mg Transdermal Daily  . polyethylene glycol  17 g Oral Daily  . predniSONE  50 mg Oral BID WC   Continuous Infusions:  Active Problems:   HTN (hypertension)   DM (diabetes mellitus) ([place])   COPD with acute exacerbation ([place])   Protein-calorie malnutrition, severe   COPD exacerbation ([place])    Time spent: >35 minutes     Kinnie Feil  [facility] Pager [phone]. If 7PM-7AM, please contact night-coverage at [email or web address], password [facility] 04/07/2018, 11:05 AM  LOS: 1 day

## 2018-04-08 DIAGNOSIS — K5900 Constipation, unspecified: Secondary | ICD-10-CM | POA: Diagnosis not present

## 2018-04-08 DIAGNOSIS — E875 Hyperkalemia: Secondary | ICD-10-CM | POA: Diagnosis not present

## 2018-04-08 DIAGNOSIS — I10 Essential (primary) hypertension: Secondary | ICD-10-CM | POA: Diagnosis not present

## 2018-04-08 DIAGNOSIS — J9621 Acute and chronic respiratory failure with hypoxia: Secondary | ICD-10-CM

## 2018-04-08 DIAGNOSIS — J441 Chronic obstructive pulmonary disease with (acute) exacerbation: Secondary | ICD-10-CM | POA: Diagnosis not present

## 2018-04-08 DIAGNOSIS — Z7951 Long term (current) use of inhaled steroids: Secondary | ICD-10-CM | POA: Diagnosis not present

## 2018-04-08 DIAGNOSIS — J9620 Acute and chronic respiratory failure, unspecified whether with hypoxia or hypercapnia: Secondary | ICD-10-CM | POA: Diagnosis not present

## 2018-04-08 DIAGNOSIS — E871 Hypo-osmolality and hyponatremia: Secondary | ICD-10-CM | POA: Diagnosis not present

## 2018-04-08 DIAGNOSIS — Z9981 Dependence on supplemental oxygen: Secondary | ICD-10-CM | POA: Diagnosis not present

## 2018-04-08 DIAGNOSIS — N390 Urinary tract infection, site not specified: Secondary | ICD-10-CM | POA: Diagnosis not present

## 2018-04-08 DIAGNOSIS — Z85828 Personal history of other malignant neoplasm of skin: Secondary | ICD-10-CM | POA: Diagnosis not present

## 2018-04-08 DIAGNOSIS — Z9841 Cataract extraction status, right eye: Secondary | ICD-10-CM | POA: Diagnosis not present

## 2018-04-08 DIAGNOSIS — E119 Type 2 diabetes mellitus without complications: Secondary | ICD-10-CM | POA: Diagnosis not present

## 2018-04-08 DIAGNOSIS — Z9842 Cataract extraction status, left eye: Secondary | ICD-10-CM | POA: Diagnosis not present

## 2018-04-08 DIAGNOSIS — B9620 Unspecified Escherichia coli [E. coli] as the cause of diseases classified elsewhere: Secondary | ICD-10-CM | POA: Diagnosis not present

## 2018-04-08 DIAGNOSIS — E222 Syndrome of inappropriate secretion of antidiuretic hormone: Secondary | ICD-10-CM | POA: Diagnosis not present

## 2018-04-08 DIAGNOSIS — Z7983 Long term (current) use of bisphosphonates: Secondary | ICD-10-CM | POA: Diagnosis not present

## 2018-04-08 DIAGNOSIS — E43 Unspecified severe protein-calorie malnutrition: Secondary | ICD-10-CM | POA: Diagnosis not present

## 2018-04-08 DIAGNOSIS — E089 Diabetes mellitus due to underlying condition without complications: Secondary | ICD-10-CM | POA: Diagnosis not present

## 2018-04-08 LAB — GLUCOSE, CAPILLARY: Glucose-Capillary: 84 mg/dL (ref 65–99)

## 2018-04-08 MED ORDER — PREDNISONE 10 MG PO TABS: ORAL_TABLET | ORAL | 0 refills | Status: DC

## 2018-04-08 MED ORDER — GUAIFENESIN ER 600 MG PO TB12: 600.0000 mg | ORAL_TABLET | Freq: Two times a day (BID) | ORAL | 0 refills | Status: AC

## 2018-04-08 MED ORDER — METOPROLOL TARTRATE 25 MG PO TABS: 25.0000 mg | ORAL_TABLET | Freq: Two times a day (BID) | ORAL | 0 refills | Status: DC

## 2018-04-08 NOTE — Discharge Summary (Signed)
Physician Discharge Summary  Jacqueline Cervantes:[id number] DOB: [birth year] DOA: 04/01/2018  PCP: [name], FNP  Admit date: 04/01/2018 Discharge date: 04/08/2018  Admitted From: Home Disposition: Home  Recommendations for Outpatient Follow-up:  1. Follow up with PCP in 1-2 weeks 2. Please obtain BMP/CBC in one week 3. Losartan and hydrochlorothiazide have been discontinued due to hyperkalemia and hyponatremia respectively.  Restart as an outpatient as felt appropriate.  Home Health: Home health RN and PT Equipment/Devices: Chronically on 3 L of oxygen  Discharge Condition: Stable CODE STATUS: Full code Diet recommendation: Heart Healthy / Carb Modified    Brief/Interim Summary: Jacqueline Asselin Helmsis a 83 y.o.femalewith medical history significantfor HTN, DM, HLD, currenttobacco abuse,severe COPD on chronic 2 L O2 at home,left breast cancer. She presents to ED complaining of progressive SOB for weeks leading to severe debility. She had been so SOB she could barely ambulate to the bathroom. She was admitted with COPD exacerbation. She has been receiving antibiotics, steroids and bronchodilators. She had slow improvement    Discharge Diagnoses:  Active Problems:   HTN (hypertension)   DM (diabetes mellitus) (Jacqueline Cervantes)   COPD with acute exacerbation (HCC)   Protein-calorie malnutrition, severe   COPD exacerbation ([place])  Acute COPD exacerbation -patient has oxygen dependent COPD and is chronically on 3 L.Influenza panel is negative. She was treated with intravenous antibiotics, steroids and bronchodilators.  Due to the advanced nature of her baseline disease, her progress is very slow.  She appears to be approaching her baseline.  She continues to have some mild wheeze which I suspect she has at baseline.  She is able to ambulate and feels ready to discharge home.  UTI -urine culture positive for E. coli. Treated with ceftriaxone IV.   Anemia, unspecified  -hemoglobin has had a decline since admission, no obvious bleeding. Anemia panel indicated chronic disease. B12 was also on the lower range of normal and was replaced. Follow-up hemoglobins had been stable.  Continue to follow as an outpatient  Hyponatremia -urine sodium/serum osmolarity indicate SIADH, likely related to COPD. She has had mild improvement with IV fluids.  Hydrochlorothiazide has been discontinued.  Hyperkalemia. ARB has been discontinued. Improved after receiving Kayexalate  Essential hypertension - blood pressures stable, resumed home medications with the exception of ARB.   Type 2 Diabetes Mellitus -she did not require any insulin.  Blood sugars remained stable.. A1c 5.7%.  Constipation. Improved with MiraLAX  Tachycardia. Heart rate was in 120s-130s on ambulation. Resting heart rate in the 110 range. EKG showed sinus tachycardia.  She was started on low-dose beta-blocker with improvement of heart rate.  Blood pressures are also currently stable.  We will continue for now.  Acute on chronic respiratory failure.  Related COPD.  Oxygen has been weaned down to home oxygen requirement and respiratory status appears to be back to baseline.  There was concern for underlying aspiration.  She was seen by speech therapy who did not feel that she needed any modified diet.    Discharge Instructions  Discharge Instructions    Diet - low sodium heart healthy   Complete by:  As directed    Face-to-face encounter (required for Medicare/Medicaid patients)   Complete by:  As directed    I Kathie Dike certify that this patient is under my care and that I, or a nurse practitioner or physician's assistant working with me, had a face-to-face encounter that meets the physician face-to-face encounter requirements with this patient on 04/08/2018. The encounter with the  patient was in whole, or in part for the following medical condition(s) which is the primary reason for home  health care (List medical condition): copd exacerbation   The encounter with the patient was in whole, or in part, for the following medical condition, which is the primary reason for home health care:  copd exacerbation   I certify that, based on my findings, the following services are medically necessary home health services:   Nursing Physical therapy     Reason for Medically Necessary Home Health Services:  Skilled Nursing- Skilled Assessment/Observation   My clinical findings support the need for the above services:  Shortness of breath with activity   Further, I certify that my clinical findings support that this patient is homebound due to:  Shortness of Breath with activity   Home Health   Complete by:  As directed    To provide the following care/treatments:   PT RN     Increase activity slowly   Complete by:  As directed      Allergies as of 04/08/2018   No Known Allergies     Medication List    STOP taking these medications   hydrochlorothiazide 12.5 MG capsule Commonly known as:  MICROZIDE   losartan 25 MG tablet Commonly known as:  COZAAR     TAKE these medications   acetaminophen 500 MG tablet Commonly known as:  TYLENOL Take 500 mg by mouth every 6 (six) hours as needed for mild pain.   albuterol 108 (90 Base) MCG/ACT inhaler Commonly known as:  PROVENTIL HFA;VENTOLIN HFA Inhale 2 puffs into the lungs every 6 (six) hours as needed for wheezing or shortness of breath.   alendronate 70 MG tablet Commonly known as:  FOSAMAX TAKE 1 TABLET BY MOUTH EVERY WEEK WITH A FULL GLASS OF WATER ON AN EMPTY STOMACH ON FRIDAYS   ALPRAZolam 0.5 MG tablet Commonly known as:  XANAX TAKE 1 TABLET BY MOUTH 2 TIMES DAILY AS NEEDED   AMBULATORY NON FORMULARY MEDICATION 2% Diltiazem gel mixed with 5% Lidocaine, apply to rectum three times a day   amLODipine 10 MG tablet Commonly known as:  NORVASC Take 1 tablet (10 mg total) by mouth daily.   atorvastatin 40 MG  tablet Commonly known as:  LIPITOR Take 1 tablet (40 mg total) by mouth daily.   budesonide-formoterol 160-4.5 MCG/ACT inhaler Commonly known as:  SYMBICORT Inhale 2 puffs into the lungs 2 (two) times daily.   citalopram 20 MG tablet Commonly known as:  CELEXA Take 1 tablet (20 mg total) by mouth daily.   fenofibrate 160 MG tablet Take 1 tablet (160 mg total) by mouth daily.   FISH OIL PO Take 1 capsule by mouth daily.   guaiFENesin 600 MG 12 hr tablet Commonly known as:  MUCINEX Take 1 tablet (600 mg total) by mouth 2 (two) times daily.   ipratropium-albuterol 0.5-2.5 (3) MG/3ML Soln Commonly known as:  DUONEB Take 3 mLs by nebulization 4 (four) times daily as needed. Dx: J44.9   metFORMIN 500 MG tablet Commonly known as:  GLUCOPHAGE TAKE 2 TABLETS IN THE MORNING AND 1 TABLET IN THE EVENING WITH MEALS.   metoprolol tartrate 25 MG tablet Commonly known as:  LOPRESSOR Take 1 tablet (25 mg total) by mouth 2 (two) times daily.   OXYGEN Inhale 2 L/min into the lungs.   polyethylene glycol packet Commonly known as:  MIRALAX / GLYCOLAX Take 17 g by mouth daily.   predniSONE 10 MG tablet Commonly  known as:  DELTASONE Take 60mg  po daily for 3days then 40mg  daily for 3days then 30mg  daily for 3days then 20mg  daily for 3days then 10mg  daily for 3days       No Known Allergies  Consultations:     Procedures/Studies: Dg Chest 2 View  Result Date: 04/02/2018 CLINICAL DATA:  Shortness of breath.  History of breast carcinoma EXAM: CHEST - 2 VIEW COMPARISON:  April 01, 2018 and April 07, 2016 FINDINGS: There is no edema or consolidation. Heart size and pulmonary vascularity are normal. No adenopathy. There is aortic atherosclerosis. There are anterior wedge compression fractures in the mid and lower thoracic region, also present in 2017. IMPRESSION: No edema or consolidation.  Aortic atherosclerosis. Aortic Atherosclerosis (ICD10-I70.0). Electronically Signed   By: Lowella Grip III M.D.   On: 04/02/2018 08:10   Dg Chest Port 1 View  Result Date: 04/01/2018 CLINICAL DATA:  Cough and shortness of breath. EXAM: PORTABLE CHEST 1 VIEW COMPARISON:  July 09, 2017 FINDINGS: Prominent skin fold over the right lateral chest are identified. Lung markings are seen on both sides with no convincing evidence of pneumothorax. The heart, hila, and mediastinum are normal. No nodules or masses. No focal infiltrates. No other acute abnormalities. IMPRESSION: Prominent skin folds over the right lateral chest with no convincing evidence of pneumothorax. The patient may benefit from a PA and lateral chest x-ray before discharge. No acute abnormality seen. Electronically Signed   By: Dorise Bullion III M.D   On: 04/01/2018 12:45       Subjective: Feeling better.  Shortness of breath is improved and approaching baseline.  Family also feels that she is looking better today.  Discharge Exam: Vitals:   04/08/18 1312 04/08/18 1515  BP: (!) 137/59   Pulse: 92   Resp: 20   Temp: 98.4 F (36.9 C)   SpO2: 99% 100%   Vitals:   04/08/18 0731 04/08/18 1106 04/08/18 1312 04/08/18 1515  BP:   (!) 137/59   Pulse:   92   Resp:   20   Temp:   98.4 F (36.9 C)   TempSrc:   Oral   SpO2: 100% 99% 99% 100%  Weight:      Height:        General: Pt is alert, awake, not in acute distress Cardiovascular: RRR, S1/S2 +, no rubs, no gallops Respiratory: Mild wheeze bilaterally Abdominal: Soft, NT, ND, bowel sounds + Extremities: no edema, no cyanosis    The results of significant diagnostics from this hospitalization (including imaging, microbiology, ancillary and laboratory) are listed below for reference.     Microbiology: Recent Results (from the past 240 hour(s))  Urine Culture     Status: Abnormal   Collection Time: 04/01/18 12:13 PM  Result Value Ref Range Status   Specimen Description   Final    URINE, CLEAN CATCH Performed at [facility], [address],  [place], [place] [address]    Special Requests   Final    NONE Performed at [facility], [address] [place], [place] [address]    Culture >=100,000 COLONIES/mL ESCHERICHIA COLI (A)  Final   Report Status 04/04/2018 FINAL  Final   Organism ID, Bacteria ESCHERICHIA COLI (A)  Final      Susceptibility   Escherichia coli - MIC*    AMPICILLIN 16 INTERMEDIATE Intermediate     CEFAZOLIN <=4 SENSITIVE Sensitive     CEFTRIAXONE <=1 SENSITIVE Sensitive     CIPROFLOXACIN >=4 RESISTANT Resistant  GENTAMICIN <=1 SENSITIVE Sensitive     IMIPENEM <=0.25 SENSITIVE Sensitive     NITROFURANTOIN <=16 SENSITIVE Sensitive     TRIMETH/SULFA <=20 SENSITIVE Sensitive     AMPICILLIN/SULBACTAM 4 SENSITIVE Sensitive     PIP/TAZO <=4 SENSITIVE Sensitive     Extended ESBL NEGATIVE Sensitive     * >=100,000 COLONIES/mL ESCHERICHIA COLI     Labs: BNP (last 3 results) No results for input(s): BNP in the last 8760 hours. Basic Metabolic Panel: Recent Labs  Lab 04/02/18 0512 04/03/18 0936 04/04/18 0603 04/05/18 0715 04/05/18 1413  NA 129* 128* 131* 132*  --   K 3.8 4.6 5.6* 5.6* 4.4  CL 89* 90* 92* 87*  --   CO2 29 27 29  32  --   GLUCOSE 133* 243* 151* 231*  --   BUN 15 14 16  22*  --   CREATININE 0.36* 0.63 0.44 0.56  --   CALCIUM 8.4* 8.8* 9.1 9.2  --    Liver Function Tests: No results for input(s): AST, ALT, ALKPHOS, BILITOT, PROT, ALBUMIN in the last 168 hours. No results for input(s): LIPASE, AMYLASE in the last 168 hours. No results for input(s): AMMONIA in the last 168 hours. CBC: Recent Labs  Lab 04/02/18 0512 04/03/18 0936 04/04/18 0603 04/05/18 0715  WBC  --  9.1 10.6* 10.4  HGB 8.8* 8.8* 8.2* 9.4*  HCT 27.1* 27.4* 26.2* 29.7*  MCV  --  81.8 80.9 81.8  PLT  --  381 423* 464*   Cardiac Enzymes: No results for input(s): CKTOTAL, CKMB, CKMBINDEX, TROPONINI in the last 168 hours. BNP: Invalid input(s): POCBNP CBG: Recent Labs  Lab 04/05/18 0500 04/05/18 0736 04/06/18 0729  04/07/18 0724 04/08/18 0730  GLUCAP 235* 199* 112* 100* 84   D-Dimer No results for input(s): DDIMER in the last 72 hours. Hgb A1c No results for input(s): HGBA1C in the last 72 hours. Lipid Profile No results for input(s): CHOL, HDL, LDLCALC, TRIG, CHOLHDL, LDLDIRECT in the last 72 hours. Thyroid function studies No results for input(s): TSH, T4TOTAL, T3FREE, THYROIDAB in the last 72 hours.  Invalid input(s): FREET3 Anemia work up No results for input(s): VITAMINB12, FOLATE, FERRITIN, TIBC, IRON, RETICCTPCT in the last 72 hours. Urinalysis    Component Value Date/Time   COLORURINE YELLOW 04/01/2018 1213   APPEARANCEUR HAZY (A) 04/01/2018 1213   LABSPEC 1.011 04/01/2018 1213   PHURINE 5.0 04/01/2018 1213   GLUCOSEU NEGATIVE 04/01/2018 1213   HGBUR MODERATE (A) 04/01/2018 1213   BILIRUBINUR NEGATIVE 04/01/2018 1213   KETONESUR NEGATIVE 04/01/2018 1213   PROTEINUR 100 (A) 04/01/2018 1213   UROBILINOGEN 1.0 09/04/2015 0240   NITRITE NEGATIVE 04/01/2018 1213   LEUKOCYTESUR NEGATIVE 04/01/2018 1213   Sepsis Labs Invalid input(s): PROCALCITONIN,  WBC,  LACTICIDVEN Microbiology Recent Results (from the past 240 hour(s))  Urine Culture     Status: Abnormal   Collection Time: 04/01/18 12:13 PM  Result Value Ref Range Status   Specimen Description   Final    URINE, CLEAN CATCH Performed at [facility], [address], [place], [place] [address]    Special Requests   Final    NONE Performed at [facility], [address], [place], [place] [address]    Culture >=100,000 COLONIES/mL ESCHERICHIA COLI (A)  Final   Report Status 04/04/2018 FINAL  Final   Organism ID, Bacteria ESCHERICHIA COLI (A)  Final      Susceptibility   Escherichia coli - MIC*    AMPICILLIN 16 INTERMEDIATE Intermediate  CEFAZOLIN <=4 SENSITIVE Sensitive     CEFTRIAXONE <=1 SENSITIVE Sensitive     CIPROFLOXACIN >=4 RESISTANT Resistant     GENTAMICIN <=1 SENSITIVE Sensitive     IMIPENEM <=0.25  SENSITIVE Sensitive     NITROFURANTOIN <=16 SENSITIVE Sensitive     TRIMETH/SULFA <=20 SENSITIVE Sensitive     AMPICILLIN/SULBACTAM 4 SENSITIVE Sensitive     PIP/TAZO <=4 SENSITIVE Sensitive     Extended ESBL NEGATIVE Sensitive     * >=100,000 COLONIES/mL ESCHERICHIA COLI     Time coordinating discharge: 4mins  SIGNED:   Kathie Dike, MD  [facility] 04/08/2018, 6:23 PM Pager   If 7PM-7AM, please contact night-coverage [email or web address] Password [email or web address]

## 2018-04-08 NOTE — Evaluation (Signed)
Clinical/Bedside Swallow Evaluation Patient Details  Name: Jacqueline Cervantes MRN: [id number] Date of Birth: 1935/06/25  Today's Date: 04/08/2018 Time: SLP Start Time (ACUTE ONLY): 6384 SLP Stop Time (ACUTE ONLY): 6659 SLP Time Calculation (min) (ACUTE ONLY): 25 min  Past Medical History:  Past Medical History:  Diagnosis Date  . Anxiety   . Arthritis    "fingers" (09/03/2015)  . Cancer of left breast ([place])   . COPD with acute exacerbation ([place])    Archie Endo 09/03/2015  . COPD with chronic bronchitis ([place])    Archie Endo 09/03/2015  . Depression   . GERD (gastroesophageal reflux disease)   . Hypercholesteremia   . Hypertension   . On home oxygen therapy    "I use my husband's oxygen 2-3 times/day; 3L" (09/03/2015)  . Prolapsed internal hemorrhoids, grade 3 05/03/2016  . Skin cancer    "face/nose"  . Type II diabetes mellitus ([place])    Past Surgical History:  Past Surgical History:  Procedure Laterality Date  . ABDOMINAL HYSTERECTOMY    . BREAST BIOPSY Left   . CATARACT EXTRACTION, BILATERAL Bilateral   . DILATION AND CURETTAGE OF UTERUS    . FLEXIBLE SIGMOIDOSCOPY  2014   [facility]  . MASTECTOMY Left 05/11/2006  . SKIN CANCER EXCISION     "cut it off my nose; went right down the middle, on top"   HPI:  Jacqueline Cervantes is a 83 y.o. female with medical history significant for HTN, DM, HLD, current tobacco abuse, severe COPD on chronic 2 L O2 at home, left breast cancer.  She presents to ED complaining of progressive SOB for weeks leading to severe debility.  She had been so SOB she could barely ambulate to the bathroom.  She was admitted with COPD exacerbation.  She has been receiving antibiotics, steroids and bronchodilators.  She had slow improvement.  Patient has had some slow improvement although still somewhat tachycardic.  This is a little bit better with beta blockers. BSE requested.   Assessment / Plan / Recommendation Clinical Impression  Pt alert and cooperative for  clinical swallow evaluation. She denies pre-existing swallowing difficulties, but does indicate that she occasionally has trouble with some solids due to edentulous status (has dentures at home). Oral motor examination reveals dry and coated tongue. SLP facilitated oral care. Pt without gross asymmetry. No overt signs or symptoms of aspiration with self presentation of regular, thin (cup/straw), or puree. Pt endorses smoking cigarettes at home and removes her oxygen to go outside to smoke. SLP reviewed correlation between dysphagia/aspiration risk in individuals with respiratory compromise/COPD. Recommend self regulated regular textures, thin liquids, po medications whole with water, Pt to take small bites/sips and swallow 2x for each sip of liquid; continue aspiration and reflux precautions. No further SLP services indicated at this time. Pt encouraged to follow up with PCP if outpatient MBSS indicated at a later time.  SLP Visit Diagnosis: Dysphagia, unspecified (R13.10)    Aspiration Risk  Mild aspiration risk    Diet Recommendation Regular;Thin liquid   Liquid Administration via: Cup;Straw Medication Administration: Whole meds with liquid Supervision: Patient able to self feed Postural Changes: Seated upright at 90 degrees;Remain upright for at least 30 minutes after po intake    Other  Recommendations Oral Care Recommendations: Oral care BID;Staff/trained caregiver to provide oral care Other Recommendations: Clarify dietary restrictions   Follow up Recommendations None      Frequency and Duration  Prognosis Prognosis for Safe Diet Advancement: Good Barriers to Reach Goals: (COPD)      Swallow Study   General Date of Onset: 04/01/18 HPI: Jacqueline Cervantes is a 83 y.o. female with medical history significant for HTN, DM, HLD, current tobacco abuse, severe COPD on chronic 2 L O2 at home, left breast cancer.  She presents to ED complaining of progressive SOB for weeks leading  to severe debility.  She had been so SOB she could barely ambulate to the bathroom.  She was admitted with COPD exacerbation.  She has been receiving antibiotics, steroids and bronchodilators.  She had slow improvement.  Patient has had some slow improvement although still somewhat tachycardic.  This is a little bit better with beta blockers. BSE requested. Type of Study: Bedside Swallow Evaluation Previous Swallow Assessment: None on record Diet Prior to this Study: Regular;Thin liquids Temperature Spikes Noted: No Respiratory Status: Nasal cannula History of Recent Intubation: No Behavior/Cognition: Alert;Cooperative;Pleasant mood Oral Cavity Assessment: Within Functional Limits;Other (comment)(lingual coating, dry) Oral Care Completed by SLP: Yes Oral Cavity - Dentition: Edentulous(has U/L dentures at home) Vision: Functional for self-feeding Self-Feeding Abilities: Able to feed self Patient Positioning: Upright in bed(Pt sitting up on edge of bed) Baseline Vocal Quality: Normal Volitional Cough: Strong Volitional Swallow: Able to elicit    Oral/Motor/Sensory Function Overall Oral Motor/Sensory Function: Within functional limits   Ice Chips Ice chips: Within functional limits Presentation: Spoon   Thin Liquid Thin Liquid: Within functional limits Presentation: Cup;Self Fed;Straw    Nectar Thick Nectar Thick Liquid: Not tested   Honey Thick Honey Thick Liquid: Not tested   Puree Puree: Within functional limits Presentation: Spoon   Solid      Solid: Within functional limits Presentation: Self Fed Other Comments: Pt consumed slowly due to edentulous status       Thank you,  Genene Churn, [place]  [place] 04/08/2018,12:02 PM

## 2018-04-08 NOTE — Progress Notes (Signed)
Removed IV, 2x2 gauze and paper tape applied to site, patient tolerated well.  Reviewed discharge instructions/ AVS with patient and patient's daughter, both verbalized understanding.  Patient transported home by daughter with home O2 in place at 2 L/min via [place].

## 2018-04-20 DIAGNOSIS — J449 Chronic obstructive pulmonary disease, unspecified: Secondary | ICD-10-CM | POA: Diagnosis not present

## 2018-05-02 ENCOUNTER — Other Ambulatory Visit: Payer: Self-pay | Admitting: Nurse Practitioner

## 2018-05-04 DIAGNOSIS — J441 Chronic obstructive pulmonary disease with (acute) exacerbation: Secondary | ICD-10-CM | POA: Diagnosis not present

## 2018-05-16 ENCOUNTER — Ambulatory Visit (INDEPENDENT_AMBULATORY_CARE_PROVIDER_SITE_OTHER): Payer: Medicare Other | Admitting: Nurse Practitioner

## 2018-05-16 ENCOUNTER — Encounter: Payer: Self-pay | Admitting: Nurse Practitioner

## 2018-05-16 VITALS — BP 120/63 | HR 101 | Temp 98.3°F | Ht 64.0 in | Wt 107.0 lb

## 2018-05-16 DIAGNOSIS — I10 Essential (primary) hypertension: Secondary | ICD-10-CM | POA: Diagnosis not present

## 2018-05-16 DIAGNOSIS — E785 Hyperlipidemia, unspecified: Secondary | ICD-10-CM | POA: Diagnosis not present

## 2018-05-16 DIAGNOSIS — F17201 Nicotine dependence, unspecified, in remission: Secondary | ICD-10-CM

## 2018-05-16 DIAGNOSIS — M810 Age-related osteoporosis without current pathological fracture: Secondary | ICD-10-CM | POA: Diagnosis not present

## 2018-05-16 DIAGNOSIS — F411 Generalized anxiety disorder: Secondary | ICD-10-CM | POA: Diagnosis not present

## 2018-05-16 DIAGNOSIS — E089 Diabetes mellitus due to underlying condition without complications: Secondary | ICD-10-CM

## 2018-05-16 DIAGNOSIS — E43 Unspecified severe protein-calorie malnutrition: Secondary | ICD-10-CM | POA: Diagnosis not present

## 2018-05-16 DIAGNOSIS — E559 Vitamin D deficiency, unspecified: Secondary | ICD-10-CM

## 2018-05-16 MED ORDER — METOPROLOL TARTRATE 25 MG PO TABS: 25.0000 mg | ORAL_TABLET | Freq: Two times a day (BID) | ORAL | 5 refills | Status: DC

## 2018-05-16 MED ORDER — ALPRAZOLAM 0.5 MG PO TABS: ORAL_TABLET | ORAL | 2 refills | Status: DC

## 2018-05-16 NOTE — Progress Notes (Signed)
Subjective:    Patient ID: Jacqueline Cervantes, female    DOB: Jul 14, 1935, 83 y.o.   MRN: [id number]   Chief Complaint: medical management of chronic issues  HPI:  1. Essential hypertension  No c/o chest pain , sob or headache. Does not check blood pressures at home. BP Readings from Last 3 Encounters:  04/08/18 (!) 137/59  02/15/18 130/73  11/14/17 128/61     2. Diabetes mellitus due to underlying condition without complication, unspecified whether long term insulin use ([place]) last hgba1c was 5.7%. she does not eat a lot. Does not watch diet and very seldom checks blood sugars  3. Age-related osteoporosis without current pathological fracture  A dexascan has been ordered but patient has not had done  Yet.  4. Vitamin D deficiency Does not always remember to take vitamin d supplements daily .  5. Tobacco abuse, in remission   6. Protein-calorie malnutrition, severe  Very low calorie diet- says she does not have the energy to eat.  7. Hyperlipidemia, unspecified hyperlipidemia type  Not watching diet. Eats whatever she feels like eating  8. Generalized anxiety disorder  Takes xanax bid. Stays very anxious    Outpatient Encounter Medications as of 05/16/2018  Medication Sig  . acetaminophen (TYLENOL) 500 MG tablet Take 500 mg by mouth every 6 (six) hours as needed for mild pain.  Marland Kitchen albuterol (PROVENTIL HFA;VENTOLIN HFA) 108 (90 Base) MCG/ACT inhaler Inhale 2 puffs into the lungs every 6 (six) hours as needed for wheezing or shortness of breath.  Marland Kitchen alendronate (FOSAMAX) 70 MG tablet TAKE 1 TABLET BY MOUTH EVERY WEEK WITH A FULL GLASS OF WATER ON AN EMPTY STOMACH ON FRIDAYS  . alendronate (FOSAMAX) 70 MG tablet TAKE 1 TABLET BY MOUTH EVERY WEEK WITH A FULL GLASS OF WATER ON AN EMPTY STOMACH ON FRIDAYS  . ALPRAZolam (XANAX) 0.5 MG tablet TAKE 1 TABLET BY MOUTH 2 TIMES DAILY AS NEEDED  . AMBULATORY NON FORMULARY MEDICATION 2% Diltiazem gel mixed with 5% Lidocaine, apply to rectum three  times a day  . amLODipine (NORVASC) 10 MG tablet Take 1 tablet (10 mg total) by mouth daily.  Marland Kitchen atorvastatin (LIPITOR) 40 MG tablet Take 1 tablet (40 mg total) by mouth daily.  . budesonide-formoterol (SYMBICORT) 160-4.5 MCG/ACT inhaler Inhale 2 puffs into the lungs 2 (two) times daily.  . citalopram (CELEXA) 20 MG tablet Take 1 tablet (20 mg total) by mouth daily.  . fenofibrate 160 MG tablet Take 1 tablet (160 mg total) by mouth daily.  Marland Kitchen guaiFENesin (MUCINEX) 600 MG 12 hr tablet Take 1 tablet (600 mg total) by mouth 2 (two) times daily.  Marland Kitchen ipratropium-albuterol (DUONEB) 0.5-2.5 (3) MG/3ML SOLN Take 3 mLs by nebulization 4 (four) times daily as needed. Dx: J44.9  . metFORMIN (GLUCOPHAGE) 500 MG tablet TAKE 2 TABLETS IN THE MORNING AND 1 TABLET IN THE EVENING WITH MEALS.  . metoprolol tartrate (LOPRESSOR) 25 MG tablet Take 1 tablet (25 mg total) by mouth 2 (two) times daily.  . Omega-3 Fatty Acids (FISH OIL PO) Take 1 capsule by mouth daily.  . OXYGEN Inhale 2 L/min into the lungs.  . polyethylene glycol (MIRALAX / GLYCOLAX) packet Take 17 g by mouth daily.  . predniSONE (DELTASONE) 10 MG tablet Take 17m po daily for 3days then 442mdaily for 3days then 3025maily for 3days then 71m80mily for 3days then 10mg48mly for 3days      New complaints: None today  Social history: She was  in the hospital in April  With COPD exacerbation. She is doing better. Wears O2  cannulla 24/7. Feels weak and tired all the time  - she says that her daughter stole her xanax and took an overdose and now she has no meds now. They are in the process of having hr evicted from the home.  Review of Systems  Constitutional: Negative for activity change and appetite change.  Eyes: Negative for pain.  Respiratory: Positive for shortness of breath and stridor.   Cardiovascular: Positive for leg swelling. Negative for chest pain and palpitations.  Gastrointestinal: Negative for abdominal pain.  Endocrine: Negative  for polydipsia.  Genitourinary: Negative.   Skin: Negative for rash.  Neurological: Negative for dizziness, weakness and headaches.  Hematological: Does not bruise/bleed easily.  Psychiatric/Behavioral: Negative.   All other systems reviewed and are negative.      Objective:   Physical Exam  Constitutional: She is oriented to person, place, and time.  HENT:  Head: Normocephalic.  Nose: Nose normal.  Mouth/Throat: Oropharynx is clear and moist.  Eyes: Pupils are equal, round, and reactive to light. EOM are normal.  Neck: Normal range of motion. Neck supple. No JVD present. Carotid bruit is not present.  Cardiovascular: Normal rate, regular rhythm, normal heart sounds and intact distal pulses.  Pulmonary/Chest: Effort normal. No respiratory distress. She has wheezes (exp wheezes throughout). She has no rales. She exhibits no tenderness.  O2 via nasal cannulla at 2 liters  Abdominal: Soft. Normal appearance, normal aorta and bowel sounds are normal. She exhibits no distension, no abdominal bruit, no pulsatile midline mass and no mass. There is no splenomegaly or hepatomegaly. There is no tenderness.  Musculoskeletal: Normal range of motion. She exhibits edema (1+ edema bil lower ext).  Lymphadenopathy:    She has no cervical adenopathy.  Neurological: She is alert and oriented to person, place, and time. She has normal reflexes.  Skin: Skin is warm and dry.  Psychiatric: She has a normal mood and affect. Her behavior is normal. Judgment and thought content normal.  Nursing note and vitals reviewed.   BP 120/63   Pulse (!) 101   Temp 98.3 F (36.8 C) (Oral)   Ht '5\' 4"'  (1.626 m)   Wt 107 lb (48.5 kg)   BMI 18.37 kg/m        Assessment & Plan:  Jacqueline Cervantes comes in today with chief complaint of Medical Management of Chronic Issues   Diagnosis and orders addressed:  1. Essential hypertension Low sodium diet - metoprolol tartrate (LOPRESSOR) 25 MG tablet; Take 1 tablet  (25 mg total) by mouth 2 (two) times daily.  Dispense: 60 tablet; Refill: 5 - CMP14+EGFR  2. Diabetes mellitus due to underlying condition without complication, unspecified whether long term insulin use ([facility]) Continue to watch carbs in diet  3. Age-related osteoporosis without current pathological fracture Weight bearing exrcise as can tolerate  4. Vitamin D deficiency  5. Tobacco abuse, in remission MUST STOP SMOKING  6. Protein-calorie malnutrition, severe Drink ensure daily  7. Hyperlipidemia, unspecified hyperlipidemia type Low fat diet - Lipid panel  8. Generalized anxiety disorder Stress management - ALPRAZolam (XANAX) 0.5 MG tablet; TAKE 1 TABLET BY MOUTH 2 TIMES DAILY AS NEEDED  Dispense: 60 tablet; Refill: 2   Labs pending Health Maintenance reviewed Diet and exercise encouraged  Follow up plan: 3 months   Mary-Margaret Hassell Done, FNP

## 2018-05-16 NOTE — Patient Instructions (Signed)
Steps to Quit Smoking Smoking tobacco can be bad for your health. It can also affect almost every organ in your body. Smoking puts you and people around you at risk for many serious long-lasting (chronic) diseases. Quitting smoking is hard, but it is one of the best things that you can do for your health. It is never too late to quit. What are the benefits of quitting smoking? When you quit smoking, you lower your risk for getting serious diseases and conditions. They can include:  Lung cancer or lung disease.  Heart disease.  Stroke.  Heart attack.  Not being able to have children (infertility).  Weak bones (osteoporosis) and broken bones (fractures).  If you have coughing, wheezing, and shortness of breath, those symptoms may get better when you quit. You may also get sick less often. If you are pregnant, quitting smoking can help to lower your chances of having a baby of low birth weight. What can I do to help me quit smoking? Talk with your doctor about what can help you quit smoking. Some things you can do (strategies) include:  Quitting smoking totally, instead of slowly cutting back how much you smoke over a period of time.  Going to in-person counseling. You are more likely to quit if you go to many counseling sessions.  Using resources and support systems, such as: ? Online chats with a counselor. ? Phone quitlines. ? Printed self-help materials. ? Support groups or group counseling. ? Text messaging programs. ? Mobile phone apps or applications.  Taking medicines. Some of these medicines may have nicotine in them. If you are pregnant or breastfeeding, do not take any medicines to quit smoking unless your doctor says it is okay. Talk with your doctor about counseling or other things that can help you.  Talk with your doctor about using more than one strategy at the same time, such as taking medicines while you are also going to in-person counseling. This can help make  quitting easier. What things can I do to make it easier to quit? Quitting smoking might feel very hard at first, but there is a lot that you can do to make it easier. Take these steps:  Talk to your family and friends. Ask them to support and encourage you.  Call phone quitlines, reach out to support groups, or work with a counselor.  Ask people who smoke to not smoke around you.  Avoid places that make you want (trigger) to smoke, such as: ? Bars. ? Parties. ? Smoke-break areas at work.  Spend time with people who do not smoke.  Lower the stress in your life. Stress can make you want to smoke. Try these things to help your stress: ? Getting regular exercise. ? Deep-breathing exercises. ? Yoga. ? Meditating. ? Doing a body scan. To do this, close your eyes, focus on one area of your body at a time from head to toe, and notice which parts of your body are tense. Try to relax the muscles in those areas.  Download or buy apps on your mobile phone or tablet that can help you stick to your quit plan. There are many free apps, such as QuitGuide from the CDC (Centers for Disease Control and Prevention). You can find more support from smokefree.gov and other websites.  This information is not intended to replace advice given to you by your health care provider. Make sure you discuss any questions you have with your health care provider. Document Released: 10/08/2009 Document   Revised: 08/09/2016 Document Reviewed: 04/28/2015 Elsevier Interactive Patient Education  2018 Elsevier Inc.  

## 2018-05-17 DIAGNOSIS — J441 Chronic obstructive pulmonary disease with (acute) exacerbation: Secondary | ICD-10-CM | POA: Diagnosis not present

## 2018-05-22 DIAGNOSIS — J449 Chronic obstructive pulmonary disease, unspecified: Secondary | ICD-10-CM | POA: Diagnosis not present

## 2018-06-04 DIAGNOSIS — J441 Chronic obstructive pulmonary disease with (acute) exacerbation: Secondary | ICD-10-CM | POA: Diagnosis not present

## 2018-06-18 ENCOUNTER — Other Ambulatory Visit: Payer: Self-pay | Admitting: Nurse Practitioner

## 2018-06-18 DIAGNOSIS — F411 Generalized anxiety disorder: Secondary | ICD-10-CM

## 2018-06-21 ENCOUNTER — Other Ambulatory Visit: Payer: Self-pay | Admitting: Nurse Practitioner

## 2018-06-25 ENCOUNTER — Ambulatory Visit: Payer: Medicare Other | Admitting: *Deleted

## 2018-06-25 ENCOUNTER — Other Ambulatory Visit: Payer: Self-pay | Admitting: Nurse Practitioner

## 2018-06-25 DIAGNOSIS — F411 Generalized anxiety disorder: Secondary | ICD-10-CM

## 2018-07-02 DIAGNOSIS — J449 Chronic obstructive pulmonary disease, unspecified: Secondary | ICD-10-CM | POA: Diagnosis not present

## 2018-07-04 DIAGNOSIS — J441 Chronic obstructive pulmonary disease with (acute) exacerbation: Secondary | ICD-10-CM | POA: Diagnosis not present

## 2018-07-10 ENCOUNTER — Encounter: Payer: Self-pay | Admitting: *Deleted

## 2018-07-10 ENCOUNTER — Ambulatory Visit (INDEPENDENT_AMBULATORY_CARE_PROVIDER_SITE_OTHER): Payer: Medicare Other | Admitting: *Deleted

## 2018-07-10 VITALS — BP 137/63 | HR 98 | Ht 64.0 in | Wt 115.0 lb

## 2018-07-10 DIAGNOSIS — Z0000 Encounter for general adult medical examination without abnormal findings: Secondary | ICD-10-CM

## 2018-07-10 NOTE — Patient Instructions (Signed)
Please bring a copy of your Living Will and [place] to our office to be filed in your chart.  Please eat a diet consisting mostly of lean protein, vegetables, and fruits and whole grains in moderation.   Thank you for coming in for your Annual Wellness Visit today!   Preventive Care 81 Years and Older, Female Preventive care refers to lifestyle choices and visits with your health care provider that can promote health and wellness. What does preventive care include?  A yearly physical exam. This is also called an annual well check.  Dental exams once or twice a year.  Routine eye exams. Ask your health care provider how often you should have your eyes checked.  Personal lifestyle choices, including: ? Daily care of your teeth and gums. ? Regular physical activity. ? Eating a healthy diet. ? Avoiding tobacco and drug use. ? Limiting alcohol use. ? Practicing safe sex. ? Taking low-dose aspirin every day. ? Taking vitamin and mineral supplements as recommended by your health care provider. What happens during an annual well check? The services and screenings done by your health care provider during your annual well check will depend on your age, overall health, lifestyle risk factors, and family history of disease. Counseling Your health care provider may ask you questions about your:  Alcohol use.  Tobacco use.  Drug use.  Emotional well-being.  Home and relationship well-being.  Sexual activity.  Eating habits.  History of falls.  Memory and ability to understand (cognition).  Work and work Statistician.  Reproductive health.  Screening You may have the following tests or measurements:  Height, weight, and BMI.  Blood pressure.  Lipid and cholesterol levels. These may be checked every 5 years, or more frequently if you are over 22 years old.  Skin check.  Lung cancer screening. You may have this screening every year starting at age 81  if you have a 30-pack-year history of smoking and currently smoke or have quit within the past 15 years.  Fecal occult blood test (FOBT) of the stool. You may have this test every year starting at age 81.  Flexible sigmoidoscopy or colonoscopy. You may have a sigmoidoscopy every 5 years or a colonoscopy every 10 years starting at age 81.  Hepatitis C blood test.  Hepatitis B blood test.  Sexually transmitted disease (STD) testing.  Diabetes screening. This is done by checking your blood sugar (glucose) after you have not eaten for a while (fasting). You may have this done every 1-3 years.  Bone density scan. This is done to screen for osteoporosis. You may have this done starting at age 81.  Mammogram. This may be done every 1-2 years. Talk to your health care provider about how often you should have regular mammograms.  Talk with your health care provider about your test results, treatment options, and if necessary, the need for more tests. Vaccines Your health care provider may recommend certain vaccines, such as:  Influenza vaccine. This is recommended every year.  Tetanus, diphtheria, and acellular pertussis (Tdap, Td) vaccine. You may need a Td booster every 10 years.  Varicella vaccine. You may need this if you have not been vaccinated.  Zoster vaccine. You may need this after age 58.  Measles, mumps, and rubella (MMR) vaccine. You may need at least one dose of MMR if you were born in 1957 or later. You may also need a second dose.  Pneumococcal 13-valent conjugate (PCV13) vaccine. One dose is recommended  after age 61.  Pneumococcal polysaccharide (PPSV23) vaccine. One dose is recommended after age 45.  Meningococcal vaccine. You may need this if you have certain conditions.  Hepatitis A vaccine. You may need this if you have certain conditions or if you travel or work in places where you may be exposed to hepatitis A.  Hepatitis B vaccine. You may need this if you have  certain conditions or if you travel or work in places where you may be exposed to hepatitis B.  Haemophilus influenzae type b (Hib) vaccine. You may need this if you have certain conditions.  Talk to your health care provider about which screenings and vaccines you need and how often you need them. This information is not intended to replace advice given to you by your health care provider. Make sure you discuss any questions you have with your health care provider. Document Released: 01/08/2016 Document Revised: 08/31/2016 Document Reviewed: 10/13/2015 Elsevier Interactive Patient Education  [employer].

## 2018-07-11 ENCOUNTER — Encounter: Payer: Self-pay | Admitting: *Deleted

## 2018-07-11 NOTE — Progress Notes (Signed)
Subjective:   Jacqueline Cervantes is a 83 y.o. female who presents for Medicare Annual (Subsequent) preventive examination.  Jacqueline Cervantes is accompanied today by her husband and step daughter.  She is using a portable oxygen tank.  She is retired from [employer] where she worked for many years.  She enjoys watching games shows on television.  She lives with her husband and daughter.  She has 1 biological daughter, 3 stepdaughters, 1 adopted daughter, and 7 grandchildren.  Her daughter that lives with her helps with cooking and house hold chores.  She feels her health is worse this year than it was last year because she feels more tired.  Jacqueline Cervantes reports one ER visit and hospital admission in the past year due to a COPD exacerbation.  She reports no surgeries in the past year.  Review of Systems:  Respiratory- short of breath       Objective:     Vitals: BP 137/63   Pulse 98   Ht 5\' 4"  (1.626 m)   Wt 115 lb (52.2 kg)   BMI 19.74 kg/m   Body mass index is 19.74 kg/m.  Advanced Directives 07/10/2018 04/01/2018 07/09/2017 06/21/2017 02/27/2017 04/28/2016 10/16/2015  Does Patient Have a Medical Advance Directive? Yes No Yes Yes No No Yes  Type of Paramedic of [place];Living will - [place];Living will [place];Living will - - Living will  Does patient want to make changes to medical advance directive? No - Patient declined - - Yes (MAU/Ambulatory/Procedural Areas - Information given) - - -  Copy of [place] in Chart? No - copy requested - No - copy requested No - copy requested - - -  Would patient like information on creating a medical advance directive? - No - Patient declined - - No - Patient declined Yes - Spiritual care consult ordered -    Tobacco Social History   Tobacco Use  Smoking Status Current Every Day Smoker  . Packs/day: 0.25  . Years: 61.00  . Pack years: 15.25  . Types: Cigarettes    Smokeless Tobacco Never Used  Tobacco Comment   Pt states she has been smoking intermittently for 61 years     Ready to quit: No Counseling given: Yes Comment: Pt states she has been smoking intermittently for 61 years   Clinical Intake:     Pain Score: 0-No pain                 Past Medical History:  Diagnosis Date  . Anxiety   . Arthritis    "fingers" (09/03/2015)  . Cancer of left breast ([facility])   . COPD with acute exacerbation ([place])    Archie Endo 09/03/2015  . COPD with chronic bronchitis ([place])    Archie Endo 09/03/2015  . Depression   . GERD (gastroesophageal reflux disease)   . Hypercholesteremia   . Hypertension   . On home oxygen therapy    "I use my husband's oxygen 2-3 times/day; 3L" (09/03/2015)  . Prolapsed internal hemorrhoids, grade 3 05/03/2016  . Skin cancer    "face/nose"  . Type II diabetes mellitus ([facility])    Past Surgical History:  Procedure Laterality Date  . ABDOMINAL HYSTERECTOMY    . BREAST BIOPSY Left   . CATARACT EXTRACTION, BILATERAL Bilateral   . DILATION AND CURETTAGE OF UTERUS    . FLEXIBLE SIGMOIDOSCOPY  2014   [facility]  . MASTECTOMY Left 05/11/2006  .  SKIN CANCER EXCISION     "cut it off my nose; went right down the middle, on top"   Family History  Problem Relation Age of Onset  . Diabetes Mother   . Cancer Father        unknown  . Cancer Sister   . Diabetes Sister   . Cancer Brother   . Diabetes Brother   . Diabetes Brother   . Colon cancer Neg Hx   . Stomach cancer Neg Hx   . Esophageal cancer Neg Hx    Social History   Socioeconomic History  . Marital status: Married    Spouse name: Not on file  . Number of children: Not on file  . Years of education: Not on file  . Highest education level: Not on file  Occupational History  . Occupation: retired  Scientific laboratory technician  . Financial resource strain: Not on file  . Food insecurity:    Worry: Not on file    Inability: Not on file  . Transportation needs:    Medical:  Not on file    Non-medical: Not on file  Tobacco Use  . Smoking status: Current Every Day Smoker    Packs/day: 0.25    Years: 61.00    Pack years: 15.25    Types: Cigarettes  . Smokeless tobacco: Never Used  . Tobacco comment: Pt states she has been smoking intermittently for 61 years  Substance and Sexual Activity  . Alcohol use: No    Alcohol/week: 0.0 oz  . Drug use: No  . Sexual activity: Never  Lifestyle  . Physical activity:    Days per week: Not on file    Minutes per session: Not on file  . Stress: Not on file  Relationships  . Social connections:    Talks on phone: Not on file    Gets together: Not on file    Attends religious service: Not on file    Active member of club or organization: Not on file    Attends meetings of clubs or organizations: Not on file    Relationship status: Not on file  Other Topics Concern  . Not on file  Social History Narrative  . Not on file    Outpatient Encounter Medications as of 07/10/2018  Medication Sig  . acetaminophen (TYLENOL) 500 MG tablet Take 500 mg by mouth every 6 (six) hours as needed for mild pain.  Marland Kitchen albuterol (PROVENTIL HFA;VENTOLIN HFA) 108 (90 Base) MCG/ACT inhaler Inhale 2 puffs into the lungs every 6 (six) hours as needed for wheezing or shortness of breath.  Marland Kitchen alendronate (FOSAMAX) 70 MG tablet TAKE 1 TABLET BY MOUTH EVERY WEEK WITH A FULL GLASS OF WATER ON AN EMPTY STOMACH ON FRIDAYS  . ALPRAZolam (XANAX) 0.5 MG tablet TAKE 1 TABLET BY MOUTH 2 TIMES DAILY AS NEEDED  . amLODipine (NORVASC) 10 MG tablet Take 1 tablet (10 mg total) by mouth daily.  Marland Kitchen atorvastatin (LIPITOR) 40 MG tablet Take 1 tablet (40 mg total) by mouth daily.  . citalopram (CELEXA) 20 MG tablet TAKE 1 TABLET BY MOUTH EVERY DAY  . fenofibrate 160 MG tablet Take 1 tablet (160 mg total) by mouth daily.  Marland Kitchen ipratropium-albuterol (DUONEB) 0.5-2.5 (3) MG/3ML SOLN Take 3 mLs by nebulization 4 (four) times daily as needed. Dx: J44.9  . metFORMIN  (GLUCOPHAGE) 500 MG tablet TAKE 2 TABLETS IN THE MORNING AND 1 TABLET IN THE EVENING WITH MEALS.  . metoprolol tartrate (LOPRESSOR) 25 MG tablet Take  1 tablet (25 mg total) by mouth 2 (two) times daily.  . Omega-3 Fatty Acids (FISH OIL PO) Take 1 capsule by mouth daily.  . OXYGEN Inhale 2 L/min into the lungs.  . polyethylene glycol (MIRALAX / GLYCOLAX) packet Take 17 g by mouth daily.  . [DISCONTINUED] citalopram (CELEXA) 20 MG tablet Take 1 tablet (20 mg total) by mouth daily.  . AMBULATORY NON FORMULARY MEDICATION 2% Diltiazem gel mixed with 5% Lidocaine, apply to rectum three times a day (Patient not taking: Reported on 07/10/2018)  . guaiFENesin (MUCINEX) 600 MG 12 hr tablet Take 1 tablet (600 mg total) by mouth 2 (two) times daily. (Patient not taking: Reported on 07/10/2018)   No facility-administered encounter medications on file as of 07/10/2018.     Activities of Daily Living In your present state of health, do you have any difficulty performing the following activities: 07/10/2018 04/01/2018  Hearing? N N  Vision? Y N  Comment Decreased vision in left eye, better when wearing glasses -  Difficulty concentrating or making decisions? N Y  Walking or climbing stairs? N Y  Dressing or bathing? Y Y  Comment Daughter helps with bathing -  Doing errands, shopping? Y N  Comment Daughter helps with taking to appointments, and does grocery shopping -  Preparing Food and eating ? N -  Using the Toilet? N -  In the past six months, have you accidently leaked urine? N -  Do you have problems with loss of bowel control? N -  Managing your Medications? N -  Managing your Finances? N -  Housekeeping or managing your Housekeeping? Y -  Comment due to shortness of breath -  Some recent data might be hidden    Patient Care Team: [name], FNP as PCP - General (Nurse Practitioner) Gatha Mayer, MD as Consulting Physician (Gastroenterology) Brand Males, MD as Consulting  Physician (Pulmonary Disease)    Assessment:   This is a routine wellness examination for Rayn.  Exercise Activities and Dietary recommendations  Patient does chair exercises/stretches daily for 15 minutes.  Intensity mild.  105 minutes per week.  Goals    . DIET - EAT MORE VEGETABLES     Try to get at least 3 servings of vegetables per day      Patient states she has a good appetite and eats 3 meals and snacks including a boost shake through out the day.  She usually has oatmeal and eggs for breakfast, spaghetti for lunch, and vegetables and meat for supper.  Patient states she does eat a fair amount of frozen and canned meals. Her daughters help with grocery shopping and cooking.  Recommended diet of mostly lean proteins, vegetables and fruits and whole grains in moderation.   Fall Risk Fall Risk  07/10/2018 05/16/2018 02/15/2018 06/21/2017 06/20/2017  Falls in the past year? No No Yes No No  Number falls in past yr: - - 2 or more - -  Injury with Fall? - - No - -   Is the patient's home free of loose throw rugs in walkways, pet beds, electrical cords, etc?   yes      Grab bars in the bathroom? yes      Handrails on the stairs?   no stairs in home      Adequate lighting?   yes    Depression Screen PHQ 2/9 Scores 07/11/2018 05/16/2018 02/15/2018 06/21/2017  PHQ - 2 Score 0 0 0 1  PHQ- 9 Score - - - -  Cognitive Function MMSE - Mini Mental State Exam 07/11/2018 06/21/2017  Orientation to time 5 4  Orientation to Place 5 4  Registration 3 3  Attention/ Calculation 0 0  Attention/Calculation-comments - didn't attempt. Stated she didn't have much education  Recall 2 3  Language- name 2 objects 2 2  Language- repeat 1 0  Language- follow 3 step command 3 3  Language- read & follow direction 1 0  Write a sentence 1 0  Write a sentence-comments - Didn't attempt  Copy design 1 0  Total score 24 19        Immunization History  Administered Date(s) Administered  .  Influenza, High Dose Seasonal PF 10/18/2017  . Influenza,inj,Quad PF,6+ Mos 11/01/2013, 10/16/2014, 09/04/2015, 10/21/2016  . Pneumococcal Conjugate-13 05/15/2015     Qualifies for Shingles Vaccine? Declined today  Screening Tests Health Maintenance  Topic Date Due  . OPHTHALMOLOGY EXAM  06/28/2015  . PNA vac Low Risk Adult (2 of 2 - PPSV23) 05/14/2016  . URINE MICROALBUMIN  08/19/2016  . INFLUENZA VACCINE  07/26/2018  . HEMOGLOBIN A1C  10/01/2018  . FOOT EXAM  02/15/2019  . TETANUS/TDAP  11/02/2023  . DEXA SCAN  Completed   Patient declined Prevnar today.  Advised her she is due for an eye exam, and she states she will schedule.   Additional Screenings:  Hepatitis C Screening: Recommend at next appointment with [name], FNP     Plan:     Bring a copy of your Living Will and [place] to our office to be filed in your chart. Eat a diet consisting mostly of lean protein, vegetables, and fruits and whole grains in moderation.   I have personally reviewed and noted the following in the patient's chart:   . Medical and social history . Use of alcohol, tobacco or illicit drugs  . Current medications and supplements . Functional ability and status . Nutritional status . Physical activity . Advanced directives . List of other physicians . Hospitalizations, surgeries, and ER visits in previous 12 months . Vitals . Screenings to include cognitive, depression, and falls . Referrals and appointments  In addition, I have reviewed and discussed with patient certain preventive protocols, quality metrics, and best practice recommendations. A written personalized care plan for preventive services as well as general preventive health recommendations were provided to patient.     Graeme Menees M, RN  07/11/2018  I have reviewed and agree with the above AWV documentation.   Terald Sleeper PA-C [facility] [address]    [place], [place] [address] [phone]

## 2018-07-15 ENCOUNTER — Other Ambulatory Visit: Payer: Self-pay | Admitting: Nurse Practitioner

## 2018-07-15 DIAGNOSIS — E089 Diabetes mellitus due to underlying condition without complications: Secondary | ICD-10-CM

## 2018-07-16 NOTE — Telephone Encounter (Signed)
Ov 08/20/18

## 2018-07-21 ENCOUNTER — Other Ambulatory Visit: Payer: Self-pay | Admitting: Nurse Practitioner

## 2018-07-21 DIAGNOSIS — I10 Essential (primary) hypertension: Secondary | ICD-10-CM

## 2018-07-22 ENCOUNTER — Other Ambulatory Visit: Payer: Self-pay | Admitting: Nurse Practitioner

## 2018-07-23 NOTE — Telephone Encounter (Signed)
Ov 08/20/18

## 2018-07-31 DIAGNOSIS — J449 Chronic obstructive pulmonary disease, unspecified: Secondary | ICD-10-CM | POA: Diagnosis not present

## 2018-08-04 DIAGNOSIS — J441 Chronic obstructive pulmonary disease with (acute) exacerbation: Secondary | ICD-10-CM | POA: Diagnosis not present

## 2018-08-20 ENCOUNTER — Encounter: Payer: Self-pay | Admitting: Nurse Practitioner

## 2018-08-20 ENCOUNTER — Ambulatory Visit: Payer: Medicare Other | Admitting: Nurse Practitioner

## 2018-08-21 ENCOUNTER — Other Ambulatory Visit: Payer: Self-pay

## 2018-08-21 DIAGNOSIS — E782 Mixed hyperlipidemia: Secondary | ICD-10-CM

## 2018-08-21 MED ORDER — ATORVASTATIN CALCIUM 40 MG PO TABS: 40.0000 mg | ORAL_TABLET | Freq: Every day | ORAL | 1 refills | Status: DC

## 2018-08-22 ENCOUNTER — Encounter: Payer: Self-pay | Admitting: Nurse Practitioner

## 2018-08-23 ENCOUNTER — Ambulatory Visit: Payer: Medicare Other | Admitting: Nurse Practitioner

## 2018-08-24 DIAGNOSIS — J449 Chronic obstructive pulmonary disease, unspecified: Secondary | ICD-10-CM | POA: Diagnosis not present

## 2018-09-14 ENCOUNTER — Ambulatory Visit: Payer: Medicare Other | Admitting: Nurse Practitioner

## 2018-09-18 ENCOUNTER — Other Ambulatory Visit: Payer: Self-pay | Admitting: Nurse Practitioner

## 2018-09-18 DIAGNOSIS — F411 Generalized anxiety disorder: Secondary | ICD-10-CM

## 2018-09-23 ENCOUNTER — Other Ambulatory Visit: Payer: Self-pay

## 2018-09-23 ENCOUNTER — Emergency Department (HOSPITAL_COMMUNITY)
Admission: EM | Admit: 2018-09-23 | Discharge: 2018-09-23 | Disposition: A | Discharge status: Home / Self Care | Payer: Medicare Other | Attending: Emergency Medicine | Admitting: Emergency Medicine

## 2018-09-23 ENCOUNTER — Encounter (HOSPITAL_COMMUNITY): Payer: Self-pay | Admitting: Emergency Medicine

## 2018-09-23 DIAGNOSIS — Z79899 Other long term (current) drug therapy: Secondary | ICD-10-CM | POA: Diagnosis not present

## 2018-09-23 DIAGNOSIS — M5412 Radiculopathy, cervical region: Secondary | ICD-10-CM | POA: Diagnosis not present

## 2018-09-23 DIAGNOSIS — F17210 Nicotine dependence, cigarettes, uncomplicated: Secondary | ICD-10-CM | POA: Diagnosis not present

## 2018-09-23 DIAGNOSIS — Z853 Personal history of malignant neoplasm of breast: Secondary | ICD-10-CM | POA: Insufficient documentation

## 2018-09-23 DIAGNOSIS — Z9981 Dependence on supplemental oxygen: Secondary | ICD-10-CM | POA: Diagnosis not present

## 2018-09-23 DIAGNOSIS — J449 Chronic obstructive pulmonary disease, unspecified: Secondary | ICD-10-CM | POA: Insufficient documentation

## 2018-09-23 DIAGNOSIS — I10 Essential (primary) hypertension: Secondary | ICD-10-CM | POA: Insufficient documentation

## 2018-09-23 DIAGNOSIS — Z9012 Acquired absence of left breast and nipple: Secondary | ICD-10-CM | POA: Diagnosis not present

## 2018-09-23 DIAGNOSIS — E119 Type 2 diabetes mellitus without complications: Secondary | ICD-10-CM | POA: Insufficient documentation

## 2018-09-23 DIAGNOSIS — Z7984 Long term (current) use of oral hypoglycemic drugs: Secondary | ICD-10-CM | POA: Insufficient documentation

## 2018-09-23 DIAGNOSIS — Z85828 Personal history of other malignant neoplasm of skin: Secondary | ICD-10-CM | POA: Diagnosis not present

## 2018-09-23 DIAGNOSIS — R51 Headache: Secondary | ICD-10-CM | POA: Diagnosis present

## 2018-09-23 MED ORDER — DEXAMETHASONE 4 MG PO TABS
4.0000 mg | ORAL_TABLET | Freq: Once | ORAL | Status: AC
  Administered 2018-09-23: 4 mg via ORAL
  Filled 2018-09-23: qty 1

## 2018-09-23 MED ORDER — TRAMADOL HCL 50 MG PO TABS: 50.0000 mg | ORAL_TABLET | Freq: Two times a day (BID) | ORAL | 0 refills | Status: AC | PRN

## 2018-09-23 MED ORDER — IPRATROPIUM-ALBUTEROL 0.5-2.5 (3) MG/3ML IN SOLN
3.0000 mL | Freq: Once | RESPIRATORY_TRACT | Status: AC
  Administered 2018-09-23: 3 mL via RESPIRATORY_TRACT
  Filled 2018-09-23: qty 3

## 2018-09-23 MED ORDER — OXYCODONE-ACETAMINOPHEN 5-325 MG PO TABS
1.0000 | ORAL_TABLET | Freq: Once | ORAL | Status: AC
  Administered 2018-09-23: 1 via ORAL
  Filled 2018-09-23: qty 1

## 2018-09-23 MED ORDER — IBUPROFEN 400 MG PO TABS
400.0000 mg | ORAL_TABLET | Freq: Once | ORAL | Status: AC
  Administered 2018-09-23: 400 mg via ORAL
  Filled 2018-09-23: qty 1

## 2018-09-23 MED ORDER — MELOXICAM 7.5 MG PO TABS: 7.5000 mg | ORAL_TABLET | Freq: Every day | ORAL | 0 refills | Status: AC

## 2018-09-23 NOTE — ED Provider Notes (Signed)
[facility] EMERGENCY DEPARTMENT Provider Note   CSN: [id number] Arrival date & time: 09/23/18  1649     History   Chief Complaint Chief Complaint  Patient presents with  . Headache    HPI Jacqueline Cervantes is a 83 y.o. female.  HPI   83 year old female with left-sided headache, neck and shoulder pain.  Gradual onset 3 to 4 days ago.  Constant.  Worse with certain movements and positions.  No acute visual changes.  No acute numbness, tingling focal loss of strength.  No blood thinners.  Denies any trauma or strain.  Past Medical History:  Diagnosis Date  . Anxiety   . Arthritis    "fingers" (09/03/2015)  . Cancer of left breast ([place])   . COPD with acute exacerbation ([place])    Archie Endo 09/03/2015  . COPD with chronic bronchitis ([place])    Archie Endo 09/03/2015  . Depression   . GERD (gastroesophageal reflux disease)   . Hypercholesteremia   . Hypertension   . On home oxygen therapy    "I use my husband's oxygen 2-3 times/day; 3L" (09/03/2015)  . Prolapsed internal hemorrhoids, grade 3 05/03/2016  . Skin cancer    "face/nose"  . Type II diabetes mellitus [facility])     Patient Active Problem List   Diagnosis Date Noted  . Protein-calorie malnutrition, severe 04/02/2018  . Vitamin D deficiency 11/14/2017  . Anal fissure 10/31/2017  . COPD, severe ([place]) 05/31/2016  . Prolapsed internal hemorrhoids, grade 3 05/03/2016  . Generalized anxiety disorder 07/11/2014  . Tobacco abuse, in remission 08/02/2013  . HTN (hypertension) 05/02/2013  . HLD (hyperlipidemia) 05/02/2013  . DM (diabetes mellitus) ([place]) 05/02/2013  . Osteoporosis 05/02/2013    Past Surgical History:  Procedure Laterality Date  . ABDOMINAL HYSTERECTOMY    . BREAST BIOPSY Left   . CATARACT EXTRACTION, BILATERAL Bilateral   . DILATION AND CURETTAGE OF UTERUS    . FLEXIBLE SIGMOIDOSCOPY  2014   [facility]  . MASTECTOMY Left 05/11/2006  . SKIN CANCER EXCISION     "cut it off my nose; went right down the middle,  on top"     OB History    Gravida  1   Para  1   Term  1   Preterm      AB      Living  1     SAB      TAB      Ectopic      Multiple      Live Births               Home Medications    Prior to Admission medications   Medication Sig Start Date End Date Taking? Authorizing Provider  acetaminophen (TYLENOL) 500 MG tablet Take 500 mg by mouth every 6 (six) hours as needed for mild pain.    [provider]  albuterol (PROVENTIL HFA;VENTOLIN HFA) 108 (90 Base) MCG/ACT inhaler Inhale 2 puffs into the lungs every 6 (six) hours as needed for wheezing or shortness of breath. 11/29/16   Hassell Done Mary-Margaret, FNP  alendronate (FOSAMAX) 70 MG tablet TAKE 1 TABLET BY MOUTH EVERY WEEK WITH A FULL GLASS OF WATER ON AN EMPTY STOMACH ON FRIDAYS 07/23/18   Hassell Done, Mary-Margaret, FNP  ALPRAZolam Duanne Moron) 0.5 MG tablet TAKE 1 TABLET BY MOUTH 2 TIMES DAILY AS NEEDED 05/16/18   Hassell Done, Mary-Margaret, FNP  AMBULATORY NON FORMULARY MEDICATION 2% Diltiazem gel mixed with 5% Lidocaine, apply to rectum three times  a day Patient not taking: Reported on 07/10/2018 10/25/17   Gatha Mayer, MD  amLODipine (NORVASC) 10 MG tablet Take 1 tablet (10 mg total) by mouth daily. 02/15/18   Hassell Done, Mary-Margaret, FNP  atorvastatin (LIPITOR) 40 MG tablet Take 1 tablet (40 mg total) by mouth daily. 08/21/18   Hassell Done, Mary-Margaret, FNP  citalopram (CELEXA) 20 MG tablet Take 1 tablet (20 mg total) by mouth daily. (Needs to be seen before next refill) 09/20/18   [name], FNP  fenofibrate 160 MG tablet Take 1 tablet (160 mg total) by mouth daily. 02/15/18   Hassell Done, Mary-Margaret, FNP  guaiFENesin (MUCINEX) 600 MG 12 hr tablet Take 1 tablet (600 mg total) by mouth 2 (two) times daily. Patient not taking: Reported on 07/10/2018 04/08/18   Kathie Dike, MD  ipratropium-albuterol (DUONEB) 0.5-2.5 (3) MG/3ML SOLN Take 3 mLs by nebulization 4 (four) times daily as needed. Dx: J44.9 04/20/17    Brand Males, MD  metFORMIN (GLUCOPHAGE) 500 MG tablet TAKE 2 TABLETS IN THE MORNING AND 1 TABLET IN THE EVENING WITH MEALS. 07/16/18   Hassell Done, Mary-Margaret, FNP  metoprolol tartrate (LOPRESSOR) 25 MG tablet Take 1 tablet (25 mg total) by mouth 2 (two) times daily. 05/16/18   Hassell Done Mary-Margaret, FNP  Omega-3 Fatty Acids (FISH OIL PO) Take 1 capsule by mouth daily.    [provider]  OXYGEN Inhale 2 L/min into the lungs.    [provider]  polyethylene glycol (MIRALAX / GLYCOLAX) packet Take 17 g by mouth daily.    [provider]    Family History Family History  Problem Relation Age of Onset  . Diabetes Mother   . Cancer Father        unknown  . Cancer Sister   . Diabetes Sister   . Cancer Brother   . Diabetes Brother   . Diabetes Brother   . Colon cancer Neg Hx   . Stomach cancer Neg Hx   . Esophageal cancer Neg Hx     Social History Social History   Tobacco Use  . Smoking status: Current Every Day Smoker    Packs/day: 0.25    Years: 61.00    Pack years: 15.25    Types: Cigarettes  . Smokeless tobacco: Never Used  . Tobacco comment: Pt states she has been smoking intermittently for 61 years  Substance Use Topics  . Alcohol use: No    Alcohol/week: 0.0 standard drinks  . Drug use: No     Allergies   Patient has no known allergies.   Review of Systems Review of Systems  All systems reviewed and negative, other than as noted in HPI.  Physical Exam Updated Vital Signs BP (!) 182/89   Pulse 100   Temp 98.5 F (36.9 C)   Resp 20   Ht 5' (1.524 m)   Wt 52.2 kg   SpO2 99%   BMI 22.48 kg/m   Physical Exam  Constitutional: She appears well-developed and well-nourished. No distress.  HENT:  Head: Normocephalic and atraumatic.  Eyes: Conjunctivae are normal. Right eye exhibits no discharge. Left eye exhibits no discharge.  Neck: Neck supple.  Tenderness to palpation mildly on the left lateral neck.  Increased pain with  neck flexion.  No overlying skin changes.  Is neurovascularly intact.  Cardiovascular: Normal rate, regular rhythm and normal heart sounds. Exam reveals no gallop and no friction rub.  No murmur heard. Pulmonary/Chest: Effort normal and breath sounds normal. No respiratory distress.  Abdominal: Soft.  She exhibits no distension. There is no tenderness.  Musculoskeletal: She exhibits no edema or tenderness.  Neurological: She is alert.  Skin: Skin is warm and dry.  Psychiatric: She has a normal mood and affect. Her behavior is normal. Thought content normal.  Nursing note and vitals reviewed.    ED Treatments / Results  Labs (all labs ordered are listed, but only abnormal results are displayed) Labs Reviewed - No data to display  EKG None  Radiology No results found.  Procedures Procedures (including critical care time)  Medications Ordered in ED Medications  oxyCODONE-acetaminophen (PERCOCET/ROXICET) 5-325 MG per tablet 1 tablet (1 tablet Oral Given 09/23/18 2021)  ipratropium-albuterol (DUONEB) 0.5-2.5 (3) MG/3ML nebulizer solution 3 mL (3 mLs Nebulization Given 09/23/18 2030)     Initial Impression / Assessment and Plan / ED Course  I have reviewed the triage vital signs and the nursing notes.  Pertinent labs & imaging results that were available during my care of the patient were reviewed by me and considered in my medical decision making (see chart for details).     Symptoms consistent with cervical radiculopathy.  Neuro exam is nonfocal.  Extremely atypical for ACS.  Plan treatment.  Return precautions discussed.  Final Clinical Impressions(s) / ED Diagnoses   Final diagnoses:  Cervical radiculopathy    ED Discharge Orders    None       Virgel Manifold, MD 10/03/18 1500

## 2018-09-23 NOTE — ED Triage Notes (Signed)
EDP made aware, no orders given at this time.

## 2018-09-23 NOTE — ED Triage Notes (Signed)
Patient c/o headache that radiates into neck and down left arm. Per patient started x3-4 days ago. Denies any nausea, vomiting, facial drooping, slurred, speech, dizziness, or photosensitivity. Denies any recent injuries or falls. Per patient taking tylenol with no relief, last dose 2 and a half hours ago.

## 2018-09-24 DIAGNOSIS — J449 Chronic obstructive pulmonary disease, unspecified: Secondary | ICD-10-CM | POA: Diagnosis not present

## 2018-09-26 ENCOUNTER — Other Ambulatory Visit: Payer: Self-pay | Admitting: Nurse Practitioner

## 2018-09-26 DIAGNOSIS — F411 Generalized anxiety disorder: Secondary | ICD-10-CM

## 2018-10-04 ENCOUNTER — Encounter: Payer: Self-pay | Admitting: Nurse Practitioner

## 2018-10-04 ENCOUNTER — Ambulatory Visit (INDEPENDENT_AMBULATORY_CARE_PROVIDER_SITE_OTHER): Payer: Medicare Other | Admitting: Nurse Practitioner

## 2018-10-04 VITALS — BP 156/66 | HR 97 | Temp 98.5°F

## 2018-10-04 DIAGNOSIS — E785 Hyperlipidemia, unspecified: Secondary | ICD-10-CM

## 2018-10-04 DIAGNOSIS — E559 Vitamin D deficiency, unspecified: Secondary | ICD-10-CM

## 2018-10-04 DIAGNOSIS — I10 Essential (primary) hypertension: Secondary | ICD-10-CM | POA: Diagnosis not present

## 2018-10-04 DIAGNOSIS — F320 Major depressive disorder, single episode, mild: Secondary | ICD-10-CM

## 2018-10-04 DIAGNOSIS — E782 Mixed hyperlipidemia: Secondary | ICD-10-CM

## 2018-10-04 DIAGNOSIS — Z23 Encounter for immunization: Secondary | ICD-10-CM

## 2018-10-04 DIAGNOSIS — E43 Unspecified severe protein-calorie malnutrition: Secondary | ICD-10-CM

## 2018-10-04 DIAGNOSIS — M810 Age-related osteoporosis without current pathological fracture: Secondary | ICD-10-CM

## 2018-10-04 DIAGNOSIS — J449 Chronic obstructive pulmonary disease, unspecified: Secondary | ICD-10-CM

## 2018-10-04 DIAGNOSIS — M7712 Lateral epicondylitis, left elbow: Secondary | ICD-10-CM

## 2018-10-04 DIAGNOSIS — F411 Generalized anxiety disorder: Secondary | ICD-10-CM

## 2018-10-04 DIAGNOSIS — E089 Diabetes mellitus due to underlying condition without complications: Secondary | ICD-10-CM

## 2018-10-04 LAB — BAYER DCA HB A1C WAIVED: HB A1C: 5.4 % (ref <7.0)

## 2018-10-04 MED ORDER — HYDROCHLOROTHIAZIDE 12.5 MG PO CAPS: 12.5000 mg | ORAL_CAPSULE | Freq: Every day | ORAL | 1 refills | Status: DC

## 2018-10-04 MED ORDER — METOPROLOL TARTRATE 25 MG PO TABS: 25.0000 mg | ORAL_TABLET | Freq: Two times a day (BID) | ORAL | 5 refills | Status: DC

## 2018-10-04 MED ORDER — ALPRAZOLAM 0.5 MG PO TABS: ORAL_TABLET | ORAL | 2 refills | Status: DC

## 2018-10-04 MED ORDER — IPRATROPIUM-ALBUTEROL 0.5-2.5 (3) MG/3ML IN SOLN: 3.0000 mL | Freq: Four times a day (QID) | RESPIRATORY_TRACT | 5 refills | Status: DC | PRN

## 2018-10-04 MED ORDER — AMLODIPINE BESYLATE 10 MG PO TABS: 10.0000 mg | ORAL_TABLET | Freq: Every day | ORAL | 1 refills | Status: DC

## 2018-10-04 MED ORDER — CITALOPRAM HYDROBROMIDE 40 MG PO TABS: 40.0000 mg | ORAL_TABLET | Freq: Every day | ORAL | 5 refills | Status: DC

## 2018-10-04 MED ORDER — FENOFIBRATE 160 MG PO TABS: 160.0000 mg | ORAL_TABLET | Freq: Every day | ORAL | 1 refills | Status: DC

## 2018-10-04 MED ORDER — ATORVASTATIN CALCIUM 40 MG PO TABS: 40.0000 mg | ORAL_TABLET | Freq: Every day | ORAL | 1 refills | Status: DC

## 2018-10-04 MED ORDER — METFORMIN HCL 500 MG PO TABS: 500.0000 mg | ORAL_TABLET | Freq: Two times a day (BID) | ORAL | 1 refills | Status: DC

## 2018-10-04 NOTE — Progress Notes (Signed)
Subjective:    Patient ID: Jacqueline Cervantes, female    DOB: 08/19/1935, 84 y.o.   MRN: [id number]   Chief Complaint: Medical Management of Chronic Issues   HPI:  1. Essential hypertension  No c/o chest pain,  or headache. Does not check blood pressure at home . BP Readings from Last 3 Encounters:  09/23/18 (!) 181/88  07/10/18 137/63  05/16/18 120/63     2. COPD, severe ([place])  Was a bad smoker in the  Past and stays SOB. She has started smoking again and smokes over 1 pack a day.  3. Diabetes mellitus due to underlying condition without complication, unspecified whether long term insulin use ([place]) last hgab1c was 5.3%. She does not check hr blood sugars very often. She denies any signs of hypoglycemia.  4. Age-related osteoporosis without current pathological fracture  Last dexascan was in 2014. Does not want to do anymore dexascans.  5. Vitamin D deficiency  Is nit taking any vitamin d supplements  6. Protein-calorie malnutrition, severe  Has very poor appetite. Will not drink protein shakes  7. Hyperlipidemia, unspecified hyperlipidemia type  Does not watch diet  8. Generalized anxiety disorder  Takes xanax bid- gets very anxious when she does not take.    Outpatient Encounter Medications as of 10/04/2018  Medication Sig  . amLODipine (NORVASC) 10 MG tablet Take 1 tablet (10 mg total) by mouth daily.  Marland Kitchen atorvastatin (LIPITOR) 40 MG tablet Take 1 tablet (40 mg total) by mouth daily.  . citalopram (CELEXA) 20 MG tablet Take 1 tablet (20 mg total) by mouth daily. (Needs to be seen before next refill)  . metFORMIN (GLUCOPHAGE) 500 MG tablet TAKE 2 TABLETS IN THE MORNING AND 1 TABLET IN THE EVENING WITH MEALS.  . metoprolol tartrate (LOPRESSOR) 25 MG tablet Take 1 tablet (25 mg total) by mouth 2 (two) times daily.  Marland Kitchen acetaminophen (TYLENOL) 500 MG tablet Take 500 mg by mouth every 6 (six) hours as needed for mild pain.  Marland Kitchen albuterol (PROVENTIL HFA;VENTOLIN HFA) 108 (90 Base)  MCG/ACT inhaler Inhale 2 puffs into the lungs every 6 (six) hours as needed for wheezing or shortness of breath.  Marland Kitchen alendronate (FOSAMAX) 70 MG tablet TAKE 1 TABLET BY MOUTH EVERY WEEK WITH A FULL GLASS OF WATER ON AN EMPTY STOMACH ON FRIDAYS  . ALPRAZolam (XANAX) 0.5 MG tablet TAKE 1 TABLET BY MOUTH 2 TIMES DAILY AS NEEDED  . AMBULATORY NON FORMULARY MEDICATION 2% Diltiazem gel mixed with 5% Lidocaine, apply to rectum three times a day (Patient not taking: Reported on 07/10/2018)  . fenofibrate 160 MG tablet Take 1 tablet (160 mg total) by mouth daily.  Marland Kitchen guaiFENesin (MUCINEX) 600 MG 12 hr tablet Take 1 tablet (600 mg total) by mouth 2 (two) times daily. (Patient not taking: Reported on 07/10/2018)  . hydrochlorothiazide (MICROZIDE) 12.5 MG capsule Take by mouth daily.  Marland Kitchen ipratropium-albuterol (DUONEB) 0.5-2.5 (3) MG/3ML SOLN Take 3 mLs by nebulization 4 (four) times daily as needed. Dx: J44.9  . meloxicam (MOBIC) 7.5 MG tablet Take 1 tablet (7.5 mg total) by mouth daily.  . Omega-3 Fatty Acids (FISH OIL PO) Take 1 capsule by mouth daily.  . OXYGEN Inhale 2 L/min into the lungs.  . polyethylene glycol (MIRALAX / GLYCOLAX) packet Take 17 g by mouth daily.  . traMADol (ULTRAM) 50 MG tablet Take 1 tablet (50 mg total) by mouth every 12 (twelve) hours as needed.       New complaints: Left  elbow has been hurting for 3 weeks. Denies any injury. Rates pain 10/10 at times. Using hand increases pain or laying on it hurts. Resting makes pain better. Daughter thinks she is depressed Depression screen [facility] 2/9 10/04/2018 07/11/2018 05/16/2018  Decreased Interest 3 0 0  Down, Depressed, Hopeless 3 0 0  PHQ - 2 Score 6 0 0  Altered sleeping 2 - -  Tired, decreased energy 2 - -  Change in appetite 0 - -  Feeling bad or failure about yourself  0 - -  Trouble concentrating 2 - -  Moving slowly or fidgety/restless 2 - -  Suicidal thoughts 0 - -  PHQ-9 Score 14 - -  Difficult doing work/chores Somewhat  difficult - -    Social history: Her husband died the Sep 25, 2018-had heart attack in his sleep   Review of Systems  Constitutional: Negative for activity change and appetite change.  HENT: Negative.   Eyes: Negative for pain.  Respiratory: Positive for shortness of breath.   Cardiovascular: Negative for chest pain, palpitations and leg swelling.  Gastrointestinal: Negative for abdominal pain.  Endocrine: Negative for polydipsia.  Genitourinary: Negative.   Skin: Negative for rash.  Neurological: Negative for dizziness, weakness and headaches.  Hematological: Does not bruise/bleed easily.  Psychiatric/Behavioral: Negative.   All other systems reviewed and are negative.      Objective:   Physical Exam  Constitutional: She is oriented to person, place, and time. She appears well-developed and well-nourished. No distress.  HENT:  Head: Normocephalic.  Nose: Nose normal.  Mouth/Throat: Oropharynx is clear and moist.  Eyes: Pupils are equal, round, and reactive to light. EOM are normal.  Neck: Normal range of motion. Neck supple. No JVD present. Carotid bruit is not present.  Cardiovascular: Normal rate, regular rhythm, normal heart sounds and intact distal pulses.  Pulmonary/Chest: Effort normal. No respiratory distress. She has wheezes (insp and exp daily). She has no rales. She exhibits no tenderness.  o2 vi nasal cannulla  Abdominal: Soft. Normal appearance, normal aorta and bowel sounds are normal. She exhibits no distension, no abdominal bruit, no pulsatile midline mass and no mass. There is no splenomegaly or hepatomegaly. There is no tenderness.  Musculoskeletal: Normal range of motion. She exhibits no edema.  Pain on palpation of left lateral epicondyle  Lymphadenopathy:    She has no cervical adenopathy.  Neurological: She is alert and oriented to person, place, and time. She has normal reflexes.  Skin: Skin is warm and dry.  Psychiatric: She has a normal mood and affect.  Her behavior is normal. Judgment and thought content normal.  Nursing note and vitals reviewed.  BP (!) 156/66   Pulse 97   Temp 98.5 F (36.9 C) (Oral)   SpO2 (!) 88%         Assessment & Plan:  Jacqueline Cervantes comes in today with chief complaint of Medical Management of Chronic Issues   Diagnosis and orders addressed:  1. Essential hypertension Low sodium diet - metoprolol tartrate (LOPRESSOR) 25 MG tablet; Take 1 tablet (25 mg total) by mouth 2 (two) times daily.  Dispense: 60 tablet; Refill: 5 - amLODipine (NORVASC) 10 MG tablet; Take 1 tablet (10 mg total) by mouth daily.  Dispense: 90 tablet; Refill: 1 - hydrochlorothiazide (MICROZIDE) 12.5 MG capsule; Take 1 capsule (12.5 mg total) by mouth daily.  Dispense: 90 capsule; Refill: 1  2. COPD, severe ([place]) Stop smoking Do not smoke with o2 on - ipratropium-albuterol (DUONEB) 0.5-2.5 (3) MG/3ML  SOLN; Take 3 mLs by nebulization 4 (four) times daily as needed. Dx: J44.9  Dispense: 360 mL; Refill: 5  3. Diabetes mellitus due to underlying condition without complication, unspecified whether long term insulin use (HCC) Continue to watch carbs in diet - metFORMIN (GLUCOPHAGE) 500 MG tablet; Take 1 tablet (500 mg total) by mouth 2 (two) times daily with a meal.  Dispense: 270 tablet; Refill: 1  4. Age-related osteoporosis without current pathological fracture Fall precautions  5. Vitamin D deficiency Daily vitamind supplement  6. Protein-calorie malnutrition, severe Protein shake daily  7. Hyperlipidemia, unspecified hyperlipidemia type Low fat diet  8. Generalized anxiety disorder Stress management - ALPRAZolam (XANAX) 0.5 MG tablet; TAKE 1 TABLET BY MOUTH 2 TIMES DAILY AS NEEDED  Dispense: 60 tablet; Refill: 2  9. Mixed hyperlipidemia Low fat diet - atorvastatin (LIPITOR) 40 MG tablet; Take 1 tablet (40 mg total) by mouth daily.  Dispense: 90 tablet; Refill: 1 - fenofibrate 160 MG tablet; Take 1 tablet (160 mg total)  by mouth daily.  Dispense: 90 tablet; Refill: 1  10. Depression, major, single episode, mild (HCC) Increased celexa from 20mg  to 40mg  - citalopram (CELEXA) 40 MG tablet; Take 1 tablet (40 mg total) by mouth daily.  Dispense: 30 tablet; Refill: 5  11. Left tennis elbow Tem=nnis elbow strap Rest arm rto prn  Labs pending Health Maintenance reviewed Diet and exercise encouraged  Follow up plan: 3 months   Mary-Margaret Hassell Done, FNP

## 2018-10-04 NOTE — Patient Instructions (Addendum)
Fall Prevention in the Home Falls can cause injuries. They can happen to people of all ages. There are many things you can do to make your home safe and to help prevent falls. What can I do on the outside of my home?  Regularly fix the edges of walkways and driveways and fix any cracks.  Remove anything that might make you trip as you walk through a door, such as a raised step or threshold.  Trim any bushes or trees on the path to your home.  Use bright outdoor lighting.  Clear any walking paths of anything that might make someone trip, such as rocks or tools.  Regularly check to see if handrails are loose or broken. Make sure that both sides of any steps have handrails.  Any raised decks and porches should have guardrails on the edges.  Have any leaves, snow, or ice cleared regularly.  Use sand or salt on walking paths during winter.  Clean up any spills in your garage right away. This includes oil or grease spills. What can I do in the bathroom?  Use night lights.  Install grab bars by the toilet and in the tub and shower. Do not use towel bars as grab bars.  Use non-skid mats or decals in the tub or shower.  If you need to sit down in the shower, use a plastic, non-slip stool.  Keep the floor dry. Clean up any water that spills on the floor as soon as it happens.  Remove soap buildup in the tub or shower regularly.  Attach bath mats securely with double-sided non-slip rug tape.  Do not have throw rugs and other things on the floor that can make you trip. What can I do in the bedroom?  Use night lights.  Make sure that you have a light by your bed that is easy to reach.  Do not use any sheets or blankets that are too big for your bed. They should not hang down onto the floor.  Have a firm chair that has side arms. You can use this for support while you get dressed.  Do not have throw rugs and other things on the floor that can make you trip. What can I do in the  kitchen?  Clean up any spills right away.  Avoid walking on wet floors.  Keep items that you use a lot in easy-to-reach places.  If you need to reach something above you, use a strong step stool that has a grab bar.  Keep electrical cords out of the way.  Do not use floor polish or wax that makes floors slippery. If you must use wax, use non-skid floor wax.  Do not have throw rugs and other things on the floor that can make you trip. What can I do with my stairs?  Do not leave any items on the stairs.  Make sure that there are handrails on both sides of the stairs and use them. Fix handrails that are broken or loose. Make sure that handrails are as long as the stairways.  Check any carpeting to make sure that it is firmly attached to the stairs. Fix any carpet that is loose or worn.  Avoid having throw rugs at the top or bottom of the stairs. If you do have throw rugs, attach them to the floor with carpet tape.  Make sure that you have a light switch at the top of the stairs and the bottom of the stairs. If you do   not have them, ask someone to add them for you. What else can I do to help prevent falls?  Wear shoes that: ? Do not have high heels. ? Have rubber bottoms. ? Are comfortable and fit you well. ? Are closed at the toe. Do not wear sandals.  If you use a stepladder: ? Make sure that it is fully opened. Do not climb a closed stepladder. ? Make sure that both sides of the stepladder are locked into place. ? Ask someone to hold it for you, if possible.  Clearly mark and make sure that you can see: ? Any grab bars or handrails. ? First and last steps. ? Where the edge of each step is.  Use tools that help you move around (mobility aids) if they are needed. These include: ? Canes. ? Walkers. ? Scooters. ? Crutches.  Turn on the lights when you go into a dark area. Replace any light bulbs as soon as they burn out.  Set up your furniture so you have a clear path.  Avoid moving your furniture around.  If any of your floors are uneven, fix them.  If there are any pets around you, be aware of where they are.  Review your medicines with your doctor. Some medicines can make you feel dizzy. This can increase your chance of falling. Ask your doctor what other things that you can do to help prevent falls. This information is not intended to replace advice given to you by your health care provider. Make sure you discuss any questions you have with your health care provider. Document Released: 10/08/2009 Document Revised: 05/19/2016 Document Reviewed: 01/16/2015 Elsevier Interactive Patient Education  2018 [place] Elbow Tennis elbow is puffiness (inflammation) of the outer tendons of your forearm close to your elbow. Your tendons attach your muscles to your bones. Tennis elbow can happen in any sport or job in which you use your elbow too much. It is caused by doing the same motion over and over. Tennis elbow can cause:  Pain and tenderness in your forearm and the outer part of your elbow.  A burning feeling. This runs from your elbow through your arm.  Weak grip in your hands.  Follow these instructions at home: Activity  Rest your elbow and wrist as told by your doctor. Try to avoid any activities that caused the problem until your doctor says that you can do them again.  If a physical therapist teaches you exercises, do all of them as told.  If you lift an object, lift it with your palm facing up. This is easier on your elbow. Lifestyle  If your tennis elbow is caused by sports, check your equipment and make sure that: ? You are using it correctly. ? It fits you well.  If your tennis elbow is caused by work, take breaks often, if you are able. Talk with your manager about doing your work in a way that is safe for you. ? If your tennis elbow is caused by computer use, talk with your manager about any changes that can be made to your  work setup. General instructions  If told, apply ice to the painful area: ? Put ice in a plastic bag. ? Place a towel between your skin and the bag. ? Leave the ice on for 20 minutes, 2-3 times per day.  Take medicines only as told by your doctor.  If you were given a brace, wear it as told by your doctor.  Keep all follow-up visits as told by your doctor. This is important. Contact a doctor if:  Your pain does not get better with treatment.  Your pain gets worse.  You have weakness in your forearm, hand, or fingers.  You cannot feel your forearm, hand, or fingers. This information is not intended to replace advice given to you by your health care provider. Make sure you discuss any questions you have with your health care provider. Document Released: 06/01/2010 Document Revised: 08/11/2016 Document Reviewed: 12/08/2014 Elsevier Interactive Patient Education  [employer].

## 2018-10-05 LAB — CMP14+EGFR
ALT: 9 IU/L (ref 0–32)
AST: 15 IU/L (ref 0–40)
Albumin/Globulin Ratio: 1.5 (ref 1.2–2.2)
Albumin: 4.1 g/dL (ref 3.5–4.7)
Alkaline Phosphatase: 27 IU/L — ABNORMAL LOW (ref 39–117)
BUN/Creatinine Ratio: 30 — ABNORMAL HIGH (ref 12–28)
BUN: 19 mg/dL (ref 8–27)
Bilirubin Total: <0.2 mg/dL (ref 0.0–1.2)
CALCIUM: 10.9 mg/dL — AB (ref 8.7–10.3)
CO2: 29 mmol/L (ref 20–29)
CREATININE: 0.63 mg/dL (ref 0.57–1.00)
Chloride: 92 mmol/L — ABNORMAL LOW (ref 96–106)
GFR, EST AFRICAN AMERICAN: 97 mL/min/{1.73_m2} (ref >59)
GFR, EST NON AFRICAN AMERICAN: 84 mL/min/{1.73_m2} (ref >59)
Globulin, Total: 2.8 g/dL (ref 1.5–4.5)
Glucose: 94 mg/dL (ref 65–99)
POTASSIUM: 5.8 mmol/L — AB (ref 3.5–5.2)
SODIUM: 137 mmol/L (ref 134–144)
TOTAL PROTEIN: 6.9 g/dL (ref 6.0–8.5)

## 2018-10-05 LAB — LIPID PANEL
CHOL/HDL RATIO: 1.9 ratio (ref 0.0–4.4)
Cholesterol, Total: 109 mg/dL (ref 100–199)
HDL: 56 mg/dL (ref >39)
LDL CALC: 27 mg/dL (ref 0–99)
TRIGLYCERIDES: 131 mg/dL (ref 0–149)
VLDL Cholesterol Cal: 26 mg/dL (ref 5–40)

## 2018-10-10 ENCOUNTER — Other Ambulatory Visit: Payer: Self-pay | Admitting: Nurse Practitioner

## 2018-10-12 ENCOUNTER — Other Ambulatory Visit: Payer: Self-pay | Admitting: Nurse Practitioner

## 2018-10-12 DIAGNOSIS — E089 Diabetes mellitus due to underlying condition without complications: Secondary | ICD-10-CM

## 2018-10-17 ENCOUNTER — Other Ambulatory Visit: Payer: Self-pay | Admitting: Nurse Practitioner

## 2018-10-17 DIAGNOSIS — F411 Generalized anxiety disorder: Secondary | ICD-10-CM

## 2018-10-18 DIAGNOSIS — J449 Chronic obstructive pulmonary disease, unspecified: Secondary | ICD-10-CM | POA: Diagnosis not present

## 2018-10-25 DIAGNOSIS — J441 Chronic obstructive pulmonary disease with (acute) exacerbation: Secondary | ICD-10-CM | POA: Diagnosis not present

## 2018-11-02 ENCOUNTER — Telehealth: Payer: Self-pay | Admitting: Nurse Practitioner

## 2018-11-02 ENCOUNTER — Other Ambulatory Visit: Payer: Self-pay | Admitting: Nurse Practitioner

## 2018-11-02 MED ORDER — HYDROCORTISONE 2.5 % RE CREA: TOPICAL_CREAM | RECTAL | 1 refills | Status: AC

## 2018-11-02 NOTE — Progress Notes (Signed)
anusol

## 2018-11-02 NOTE — Telephone Encounter (Signed)
I can call something in for hemorrhoids but the family will have to do alert system

## 2018-11-06 ENCOUNTER — Other Ambulatory Visit: Payer: Self-pay | Admitting: *Deleted

## 2018-11-06 NOTE — Telephone Encounter (Signed)
Opened in error

## 2018-11-07 NOTE — Telephone Encounter (Signed)
Aware, script for hemorroids was sent in.    Patient wants a script for a  small portable oxygen unit.

## 2018-11-07 NOTE — Telephone Encounter (Signed)
Please address when returning to work.

## 2018-11-07 NOTE — Telephone Encounter (Signed)
Forward to PCP.

## 2018-11-07 NOTE — Telephone Encounter (Signed)
Jacqueline Cervantes I think this patient is already on o2 and just needs portable unit. Can you check on this?

## 2018-12-04 ENCOUNTER — Telehealth: Payer: Self-pay | Admitting: Nurse Practitioner

## 2018-12-04 NOTE — Telephone Encounter (Signed)
[facility] is out of 0.5mg  and 0.25mg  xanax, can we send rx to [place] [facility] for the 0.25mg  as they have it. Please advise.

## 2018-12-06 MED ORDER — ALPRAZOLAM 0.25 MG PO TABS: 0.5000 mg | ORAL_TABLET | Freq: Two times a day (BID) | ORAL | 0 refills | Status: DC | PRN

## 2018-12-14 ENCOUNTER — Other Ambulatory Visit: Payer: Self-pay

## 2018-12-14 NOTE — Telephone Encounter (Signed)
Can you do another refill for Xanax to [facility] for January so caregiver doesn't have to worry about it  Coming to see you Jan. 14th

## 2018-12-17 MED ORDER — ALPRAZOLAM 0.25 MG PO TABS: 0.5000 mg | ORAL_TABLET | Freq: Two times a day (BID) | ORAL | 0 refills | Status: DC | PRN

## 2018-12-17 NOTE — Telephone Encounter (Signed)
Did refill on xanax but cannot fill till January 12,2020

## 2018-12-17 NOTE — Addendum Note (Signed)
Addended by: [name] on: 12/17/2018 02:12 PM   Modules accepted: Orders

## 2018-12-25 DIAGNOSIS — J441 Chronic obstructive pulmonary disease with (acute) exacerbation: Secondary | ICD-10-CM | POA: Diagnosis not present

## 2018-12-27 ENCOUNTER — Other Ambulatory Visit: Payer: Self-pay | Admitting: Nurse Practitioner

## 2019-01-03 DIAGNOSIS — J449 Chronic obstructive pulmonary disease, unspecified: Secondary | ICD-10-CM | POA: Diagnosis not present

## 2019-01-04 DIAGNOSIS — J449 Chronic obstructive pulmonary disease, unspecified: Secondary | ICD-10-CM | POA: Diagnosis not present

## 2019-01-07 DIAGNOSIS — J441 Chronic obstructive pulmonary disease with (acute) exacerbation: Secondary | ICD-10-CM | POA: Diagnosis not present

## 2019-01-08 ENCOUNTER — Ambulatory Visit (INDEPENDENT_AMBULATORY_CARE_PROVIDER_SITE_OTHER): Payer: Medicare Other | Admitting: Nurse Practitioner

## 2019-01-08 ENCOUNTER — Encounter: Payer: Self-pay | Admitting: Nurse Practitioner

## 2019-01-08 VITALS — BP 133/66 | HR 96 | Temp 98.4°F

## 2019-01-08 DIAGNOSIS — I10 Essential (primary) hypertension: Secondary | ICD-10-CM | POA: Diagnosis not present

## 2019-01-08 DIAGNOSIS — E559 Vitamin D deficiency, unspecified: Secondary | ICD-10-CM

## 2019-01-08 DIAGNOSIS — E785 Hyperlipidemia, unspecified: Secondary | ICD-10-CM

## 2019-01-08 DIAGNOSIS — J449 Chronic obstructive pulmonary disease, unspecified: Secondary | ICD-10-CM

## 2019-01-08 DIAGNOSIS — E089 Diabetes mellitus due to underlying condition without complications: Secondary | ICD-10-CM

## 2019-01-08 DIAGNOSIS — M810 Age-related osteoporosis without current pathological fracture: Secondary | ICD-10-CM | POA: Diagnosis not present

## 2019-01-08 DIAGNOSIS — E782 Mixed hyperlipidemia: Secondary | ICD-10-CM

## 2019-01-08 DIAGNOSIS — F411 Generalized anxiety disorder: Secondary | ICD-10-CM

## 2019-01-08 LAB — BAYER DCA HB A1C WAIVED: HB A1C (BAYER DCA - WAIVED): 5.7 % (ref <7.0)

## 2019-01-08 MED ORDER — ALPRAZOLAM 0.5 MG PO TABS: ORAL_TABLET | ORAL | 2 refills | Status: AC

## 2019-01-08 MED ORDER — METFORMIN HCL 500 MG PO TABS: 500.0000 mg | ORAL_TABLET | Freq: Three times a day (TID) | ORAL | 1 refills | Status: DC

## 2019-01-08 MED ORDER — AMLODIPINE BESYLATE 10 MG PO TABS: 10.0000 mg | ORAL_TABLET | Freq: Every day | ORAL | 1 refills | Status: DC

## 2019-01-08 MED ORDER — FENOFIBRATE 160 MG PO TABS: 160.0000 mg | ORAL_TABLET | Freq: Every day | ORAL | 1 refills | Status: AC

## 2019-01-08 MED ORDER — HYDROCHLOROTHIAZIDE 12.5 MG PO CAPS: 12.5000 mg | ORAL_CAPSULE | Freq: Every day | ORAL | 1 refills | Status: DC

## 2019-01-08 MED ORDER — ATORVASTATIN CALCIUM 40 MG PO TABS: 40.0000 mg | ORAL_TABLET | Freq: Every day | ORAL | 1 refills | Status: DC

## 2019-01-08 MED ORDER — METOPROLOL TARTRATE 25 MG PO TABS: 25.0000 mg | ORAL_TABLET | Freq: Two times a day (BID) | ORAL | 1 refills | Status: DC

## 2019-01-08 MED ORDER — CITALOPRAM HYDROBROMIDE 20 MG PO TABS: 20.0000 mg | ORAL_TABLET | Freq: Every day | ORAL | 2 refills | Status: DC

## 2019-01-08 NOTE — Progress Notes (Signed)
Subjective:    Patient ID: Jacqueline Cervantes, female    DOB: [birth year], 84 y.o.   MRN: [id number]   Chief Complaint: medical management of chronic issues  HPI:  1. Essential hypertension  No c/o chest pain, sob or headache. Does not check blood pressure a home. BP Readings from Last 3 Encounters:  10/04/18 (!) 156/66  09/23/18 (!) 181/88  07/10/18 137/63     2. COPD, severe ([place])  Uses duoneb nebs at home. Is on oxygen '@2'  l via cannulla  3. Diabetes mellitus due to underlying condition without complication, unspecified whether long term insulin use ([place]) last hgba1c was 5.4%. doe snot check blood sugars at home. Has very poor appetite  4. Age-related osteoporosis without current pathological fracture  Last dexascan  Was done in 2014 and patient does not want to do anymore.  5. Hyperlipidemia, unspecified hyperlipidemia type  Does not watch diet. Just eats whatever she feels like eating because appetite is so poor.  6. Generalized anxiety disorder  Takes celexa and xanax. She says combination works well for her.  7. Vitamin D deficiency  Takes vitamin d supplement when remembers  8.       Smoker            Smokes 1/2 pack a day. She turns her oxygen off and goes outside to                  smoke.  Outpatient Encounter Medications as of 01/08/2019  Medication Sig  . acetaminophen (TYLENOL) 500 MG tablet Take 500 mg by mouth every 6 (six) hours as needed for mild pain.  Marland Kitchen albuterol (PROVENTIL HFA;VENTOLIN HFA) 108 (90 Base) MCG/ACT inhaler Inhale 2 puffs into the lungs every 6 (six) hours as needed for wheezing or shortness of breath.  Marland Kitchen alendronate (FOSAMAX) 70 MG tablet TAKE 1 TABLET BY MOUTH EVERY WEEK WITH A FULL GLASS OF WATER ON AN EMPTY STOMACH ON FRIDAYS  . ALPRAZolam (XANAX) 0.25 MG tablet Take 2 tablets (0.5 mg total) by mouth 2 (two) times daily as needed for anxiety.  . ALPRAZolam (XANAX) 0.5 MG tablet TAKE 1 TABLET BY MOUTH 2 TIMES DAILY AS NEEDED  . AMBULATORY NON  FORMULARY MEDICATION 2% Diltiazem gel mixed with 5% Lidocaine, apply to rectum three times a day (Patient not taking: Reported on 07/10/2018)  . amLODipine (NORVASC) 10 MG tablet Take 1 tablet (10 mg total) by mouth daily.  Marland Kitchen atorvastatin (LIPITOR) 40 MG tablet Take 1 tablet (40 mg total) by mouth daily.  . citalopram (CELEXA) 20 MG tablet TAKE 1 TABLET (20 MG TOTAL) BY MOUTH DAILY. (NEEDS TO BE SEEN BEFORE NEXT REFILL)  . fenofibrate 160 MG tablet Take 1 tablet (160 mg total) by mouth daily.  Marland Kitchen guaiFENesin (MUCINEX) 600 MG 12 hr tablet Take 1 tablet (600 mg total) by mouth 2 (two) times daily. (Patient not taking: Reported on 07/10/2018)  . hydrochlorothiazide (MICROZIDE) 12.5 MG capsule Take 1 capsule (12.5 mg total) by mouth daily.  . hydrocortisone (ANUSOL-HC) 2.5 % rectal cream Apply rectally 2 times daily  . ipratropium-albuterol (DUONEB) 0.5-2.5 (3) MG/3ML SOLN Take 3 mLs by nebulization 4 (four) times daily as needed. Dx: J44.9  . meloxicam (MOBIC) 7.5 MG tablet Take 1 tablet (7.5 mg total) by mouth daily. (Patient not taking: Reported on 10/04/2018)  . metFORMIN (GLUCOPHAGE) 500 MG tablet TAKE 2 TABLETS IN THE MORNING AND 1 TABLET IN THE EVENING WITH MEALS.  . metoprolol tartrate (LOPRESSOR) 25 MG  tablet Take 1 tablet (25 mg total) by mouth 2 (two) times daily.  . Omega-3 Fatty Acids (FISH OIL PO) Take 1 capsule by mouth daily.  . OXYGEN Inhale 2 L/min into the lungs.  . polyethylene glycol (MIRALAX / GLYCOLAX) packet Take 17 g by mouth daily.  . traMADol (ULTRAM) 50 MG tablet Take 1 tablet (50 mg total) by mouth every 12 (twelve) hours as needed.      New complaints: None today  Social history: Her husband died several months ago. She is going to move into a trailer next to her daughter I soon. Right now she lives by herself and she has someone check on her daily.  Review of Systems  Constitutional: Negative for activity change and appetite change.  HENT: Negative.   Eyes:  Negative for pain.  Respiratory: Negative for shortness of breath.   Cardiovascular: Negative for chest pain, palpitations and leg swelling.  Gastrointestinal: Negative for abdominal pain.  Endocrine: Negative for polydipsia.  Genitourinary: Negative.   Skin: Negative for rash.  Neurological: Negative for dizziness, weakness and headaches.  Hematological: Does not bruise/bleed easily.  Psychiatric/Behavioral: Negative.   All other systems reviewed and are negative.      Objective:   Physical Exam Vitals signs and nursing note reviewed.  Constitutional:      General: She is not in acute distress.    Appearance: Normal appearance. She is well-developed and normal weight.  HENT:     Head: Normocephalic.     Nose: Nose normal.  Eyes:     Pupils: Pupils are equal, round, and reactive to light.  Neck:     Musculoskeletal: Normal range of motion and neck supple.     Vascular: No carotid bruit or JVD.  Cardiovascular:     Rate and Rhythm: Normal rate and regular rhythm.     Heart sounds: Normal heart sounds.  Pulmonary:     Effort: Pulmonary effort is normal. No respiratory distress.     Breath sounds: Normal breath sounds. No wheezing or rales.     Comments: O2 via nasal canula at 2L Chest:     Chest wall: No tenderness.  Abdominal:     General: Bowel sounds are normal. There is no distension or abdominal bruit.     Palpations: Abdomen is soft. There is no hepatomegaly, splenomegaly, mass or pulsatile mass.     Tenderness: There is no abdominal tenderness.  Musculoskeletal: Normal range of motion.  Lymphadenopathy:     Cervical: No cervical adenopathy.  Skin:    General: Skin is warm and dry.  Neurological:     Mental Status: She is alert and oriented to person, place, and time.     Deep Tendon Reflexes: Reflexes are normal and symmetric.  Psychiatric:        Behavior: Behavior normal.        Thought Content: Thought content normal.        Judgment: Judgment normal.     BP 133/66   Pulse 96   Temp 98.4 F (36.9 C) (Oral)   SpO2 90%         Assessment & Plan:  Jacqueline Cervantes comes in today with chief complaint of No chief complaint on file.   Diagnosis and orders addressed:  1. Essential hypertension Low sodium diet - metoprolol tartrate (LOPRESSOR) 25 MG tablet; Take 1 tablet (25 mg total) by mouth 2 (two) times daily.  Dispense: 180 tablet; Refill: 1 - hydrochlorothiazide (MICROZIDE) 12.5 MG capsule; Take  1 capsule (12.5 mg total) by mouth daily.  Dispense: 90 capsule; Refill: 1 - amLODipine (NORVASC) 10 MG tablet; Take 1 tablet (10 mg total) by mouth daily.  Dispense: 90 tablet; Refill: 1 - CMP14+EGFR  2. COPD, severe ([place]) Need to continue oxygen and nebs  3. Diabetes mellitus due to underlying condition without complication, unspecified whether long term insulin use ([facility]) Low carb diet - metFORMIN (GLUCOPHAGE) 500 MG tablet; Take 1 tablet (500 mg total) by mouth 3 (three) times daily.  Dispense: 270 tablet; Refill: 1 - Bayer DCA Hb A1c Waived  4. Age-related osteoporosis without current pathological fracture Cannot tolerate weigh bearing exercises  5. Hyperlipidemia, unspecified hyperlipidemia type Low fat diet - Lipid panel  6. Generalized anxiety disorder - citalopram (CELEXA) 20 MG tablet; Take 1 tablet (20 mg total) by mouth daily. (Needs to be seen before next refill)  Dispense: 30 tablet; Refill: 2 - ALPRAZolam (XANAX) 0.5 MG tablet; TAKE 1 TABLET BY MOUTH 2 TIMES DAILY AS NEEDED  Dispense: 60 tablet; Refill: 2  7. Vitamin D deficiency Continue daily vitamin d supplement  8. Mixed hyperlipidemia Low fat diet - atorvastatin (LIPITOR) 40 MG tablet; Take 1 tablet (40 mg total) by mouth daily.  Dispense: 90 tablet; Refill: 1 - fenofibrate 160 MG tablet; Take 1 tablet (160 mg total) by mouth daily.  Dispense: 90 tablet; Refill: 1   Labs pending Health Maintenance reviewed Diet and exercise encouraged  Follow up  plan: 3 months   Mary-Margaret Hassell Done, FNP

## 2019-01-08 NOTE — Patient Instructions (Signed)
Fall Prevention in the Home, Adult  Falls can cause injuries. They can happen to people of all ages. There are many things you can do to make your home safe and to help prevent falls. Ask for help when making these changes, if needed.  What actions can I take to prevent falls?  General Instructions  · Use good lighting in all rooms. Replace any light bulbs that burn out.  · Turn on the lights when you go into a dark area. Use night-lights.  · Keep items that you use often in easy-to-reach places. Lower the shelves around your home if necessary.  · Set up your furniture so you have a clear path. Avoid moving your furniture around.  · Do not have throw rugs and other things on the floor that can make you trip.  · Avoid walking on wet floors.  · If any of your floors are uneven, fix them.  · Add color or contrast paint or tape to clearly mark and help you see:  ? Any grab bars or handrails.  ? First and last steps of stairways.  ? Where the edge of each step is.  · If you use a stepladder:  ? Make sure that it is fully opened. Do not climb a closed stepladder.  ? Make sure that both sides of the stepladder are locked into place.  ? Ask someone to hold the stepladder for you while you use it.  · If there are any pets around you, be aware of where they are.  What can I do in the bathroom?         · Keep the floor dry. Clean up any water that spills onto the floor as soon as it happens.  · Remove soap buildup in the tub or shower regularly.  · Use non-skid mats or decals on the floor of the tub or shower.  · Attach bath mats securely with double-sided, non-slip rug tape.  · If you need to sit down in the shower, use a plastic, non-slip stool.  · Install grab bars by the toilet and in the tub and shower. Do not use towel bars as grab bars.  What can I do in the bedroom?  · Make sure that you have a light by your bed that is easy to reach.  · Do not use any sheets or blankets that are too big for your bed. They should  not hang down onto the floor.  · Have a firm chair that has side arms. You can use this for support while you get dressed.  What can I do in the kitchen?  · Clean up any spills right away.  · If you need to reach something above you, use a strong step stool that has a grab bar.  · Keep electrical cords out of the way.  · Do not use floor polish or wax that makes floors slippery. If you must use wax, use non-skid floor wax.  What can I do with my stairs?  · Do not leave any items on the stairs.  · Make sure that you have a light switch at the top of the stairs and the bottom of the stairs. If you do not have them, ask someone to add them for you.  · Make sure that there are handrails on both sides of the stairs, and use them. Fix handrails that are broken or loose. Make sure that handrails are as long as the stairways.  ·   Install non-slip stair treads on all stairs in your home.  · Avoid having throw rugs at the top or bottom of the stairs. If you do have throw rugs, attach them to the floor with carpet tape.  · Choose a carpet that does not hide the edge of the steps on the stairway.  · Check any carpeting to make sure that it is firmly attached to the stairs. Fix any carpet that is loose or worn.  What can I do on the outside of my home?  · Use bright outdoor lighting.  · Regularly fix the edges of walkways and driveways and fix any cracks.  · Remove anything that might make you trip as you walk through a door, such as a raised step or threshold.  · Trim any bushes or trees on the path to your home.  · Regularly check to see if handrails are loose or broken. Make sure that both sides of any steps have handrails.  · Install guardrails along the edges of any raised decks and porches.  · Clear walking paths of anything that might make someone trip, such as tools or rocks.  · Have any leaves, snow, or ice cleared regularly.  · Use sand or salt on walking paths during winter.  · Clean up any spills in your garage right  away. This includes grease or oil spills.  What other actions can I take?  · Wear shoes that:  ? Have a low heel. Do not wear high heels.  ? Have rubber bottoms.  ? Are comfortable and fit you well.  ? Are closed at the toe. Do not wear open-toe sandals.  · Use tools that help you move around (mobility aids) if they are needed. These include:  ? Canes.  ? Walkers.  ? Scooters.  ? Crutches.  · Review your medicines with your doctor. Some medicines can make you feel dizzy. This can increase your chance of falling.  Ask your doctor what other things you can do to help prevent falls.  Where to find more information  · Centers for Disease Control and Prevention, STEADI: https://cdc.gov  · National Institute on Aging: https://go4life.nia.nih.gov  Contact a doctor if:  · You are afraid of falling at home.  · You feel weak, drowsy, or dizzy at home.  · You fall at home.  Summary  · There are many simple things that you can do to make your home safe and to help prevent falls.  · Ways to make your home safe include removing tripping hazards and installing grab bars in the bathroom.  · Ask for help when making these changes in your home.  This information is not intended to replace advice given to you by your health care provider. Make sure you discuss any questions you have with your health care provider.  Document Released: 10/08/2009 Document Revised: 07/27/2017 Document Reviewed: 07/27/2017  Elsevier Interactive Patient Education © 2019 [employer]

## 2019-01-09 LAB — CMP14+EGFR
A/G RATIO: 1.6 (ref 1.2–2.2)
ALK PHOS: 17 IU/L — AB (ref 39–117)
ALT: 9 IU/L (ref 0–32)
AST: 14 IU/L (ref 0–40)
Albumin: 4.1 g/dL (ref 3.5–4.7)
BUN/Creatinine Ratio: 29 — ABNORMAL HIGH (ref 12–28)
BUN: 22 mg/dL (ref 8–27)
CO2: 29 mmol/L (ref 20–29)
Calcium: 10.1 mg/dL (ref 8.7–10.3)
Chloride: 97 mmol/L (ref 96–106)
Creatinine, Ser: 0.75 mg/dL (ref 0.57–1.00)
GFR calc non Af Amer: 75 mL/min/{1.73_m2} (ref >59)
GFR, EST AFRICAN AMERICAN: 86 mL/min/{1.73_m2} (ref >59)
GLUCOSE: 115 mg/dL — AB (ref 65–99)
Globulin, Total: 2.6 g/dL (ref 1.5–4.5)
POTASSIUM: 5.4 mmol/L — AB (ref 3.5–5.2)
Sodium: 140 mmol/L (ref 134–144)
Total Protein: 6.7 g/dL (ref 6.0–8.5)

## 2019-01-09 LAB — LIPID PANEL
CHOLESTEROL TOTAL: 121 mg/dL (ref 100–199)
Chol/HDL Ratio: 2.1 ratio (ref 0.0–4.4)
HDL: 57 mg/dL (ref >39)
LDL Calculated: 42 mg/dL (ref 0–99)
TRIGLYCERIDES: 112 mg/dL (ref 0–149)
VLDL CHOLESTEROL CAL: 22 mg/dL (ref 5–40)

## 2019-01-11 ENCOUNTER — Other Ambulatory Visit: Payer: Self-pay | Admitting: *Deleted

## 2019-01-11 DIAGNOSIS — E875 Hyperkalemia: Secondary | ICD-10-CM

## 2019-01-23 ENCOUNTER — Other Ambulatory Visit: Payer: Self-pay | Admitting: *Deleted

## 2019-01-23 ENCOUNTER — Telehealth: Payer: Self-pay | Admitting: Nurse Practitioner

## 2019-01-23 DIAGNOSIS — J449 Chronic obstructive pulmonary disease, unspecified: Secondary | ICD-10-CM

## 2019-01-23 MED ORDER — IPRATROPIUM-ALBUTEROL 0.5-2.5 (3) MG/3ML IN SOLN: 3.0000 mL | Freq: Four times a day (QID) | RESPIRATORY_TRACT | 2 refills | Status: DC | PRN

## 2019-01-23 NOTE — Telephone Encounter (Signed)
Patient aware, script is ready. 

## 2019-01-30 ENCOUNTER — Other Ambulatory Visit: Payer: Self-pay | Admitting: Nurse Practitioner

## 2019-01-30 DIAGNOSIS — F411 Generalized anxiety disorder: Secondary | ICD-10-CM

## 2019-02-03 ENCOUNTER — Other Ambulatory Visit: Payer: Self-pay | Admitting: Nurse Practitioner

## 2019-02-04 NOTE — Telephone Encounter (Signed)
Last seen 01/08/2019

## 2019-02-07 DIAGNOSIS — J441 Chronic obstructive pulmonary disease with (acute) exacerbation: Secondary | ICD-10-CM | POA: Diagnosis not present

## 2019-02-27 DIAGNOSIS — J449 Chronic obstructive pulmonary disease, unspecified: Secondary | ICD-10-CM | POA: Diagnosis not present

## 2019-03-04 DIAGNOSIS — J441 Chronic obstructive pulmonary disease with (acute) exacerbation: Secondary | ICD-10-CM | POA: Diagnosis not present

## 2019-03-04 DIAGNOSIS — J449 Chronic obstructive pulmonary disease, unspecified: Secondary | ICD-10-CM | POA: Diagnosis not present

## 2019-03-05 DIAGNOSIS — J441 Chronic obstructive pulmonary disease with (acute) exacerbation: Secondary | ICD-10-CM | POA: Diagnosis not present

## 2019-03-28 ENCOUNTER — Other Ambulatory Visit: Payer: Self-pay | Admitting: Nurse Practitioner

## 2019-03-28 DIAGNOSIS — J449 Chronic obstructive pulmonary disease, unspecified: Secondary | ICD-10-CM

## 2019-04-05 DIAGNOSIS — J441 Chronic obstructive pulmonary disease with (acute) exacerbation: Secondary | ICD-10-CM | POA: Diagnosis not present

## 2019-04-09 ENCOUNTER — Ambulatory Visit: Payer: Medicare Other | Admitting: Nurse Practitioner

## 2019-04-10 ENCOUNTER — Other Ambulatory Visit: Payer: Self-pay | Admitting: Nurse Practitioner

## 2019-04-10 ENCOUNTER — Telehealth: Payer: Self-pay

## 2019-04-10 NOTE — Telephone Encounter (Signed)
Tried multiples times to contact patient about changing appt to televisit. Called home number and all of daughters numbers listed in charts. Unable to leave a message at any number. Did reach someone at patients home number and she stated that [facility] and she was just living in the home and they had not disconnected her number yet. She gave me another number for the patient. [phone]. I called and was unable to leave a message because voicemail was full. Tried numerous times.Patient did not show up for her appt.

## 2019-04-28 ENCOUNTER — Other Ambulatory Visit: Payer: Self-pay | Admitting: Nurse Practitioner

## 2019-04-28 DIAGNOSIS — F411 Generalized anxiety disorder: Secondary | ICD-10-CM

## 2019-05-05 DIAGNOSIS — J441 Chronic obstructive pulmonary disease with (acute) exacerbation: Secondary | ICD-10-CM | POA: Diagnosis not present

## 2019-05-06 DIAGNOSIS — J441 Chronic obstructive pulmonary disease with (acute) exacerbation: Secondary | ICD-10-CM | POA: Diagnosis not present

## 2019-05-06 DIAGNOSIS — J449 Chronic obstructive pulmonary disease, unspecified: Secondary | ICD-10-CM | POA: Diagnosis not present

## 2019-05-09 ENCOUNTER — Other Ambulatory Visit: Payer: Self-pay | Admitting: Nurse Practitioner

## 2019-05-09 DIAGNOSIS — J449 Chronic obstructive pulmonary disease, unspecified: Secondary | ICD-10-CM

## 2019-05-10 ENCOUNTER — Other Ambulatory Visit: Payer: Self-pay | Admitting: *Deleted

## 2019-05-10 ENCOUNTER — Telehealth: Payer: Self-pay | Admitting: *Deleted

## 2019-05-10 ENCOUNTER — Other Ambulatory Visit: Payer: Self-pay | Admitting: Nurse Practitioner

## 2019-05-10 DIAGNOSIS — J449 Chronic obstructive pulmonary disease, unspecified: Secondary | ICD-10-CM

## 2019-05-10 MED ORDER — IPRATROPIUM-ALBUTEROL 0.5-2.5 (3) MG/3ML IN SOLN: 3.0000 mL | Freq: Four times a day (QID) | RESPIRATORY_TRACT | 1 refills | Status: DC | PRN

## 2019-05-10 NOTE — Telephone Encounter (Signed)
Daughter aware by vm

## 2019-05-10 NOTE — Telephone Encounter (Signed)
VM from daughter Pt moved down with daughter Needing refill on nebulizer medication [facility] Please advise not on med list

## 2019-05-10 NOTE — Telephone Encounter (Signed)
rx sent into pharmacy

## 2019-05-24 ENCOUNTER — Other Ambulatory Visit: Payer: Self-pay | Admitting: Nurse Practitioner

## 2019-05-24 DIAGNOSIS — F411 Generalized anxiety disorder: Secondary | ICD-10-CM

## 2019-05-27 ENCOUNTER — Other Ambulatory Visit: Payer: Self-pay | Admitting: Internal Medicine

## 2019-05-27 DIAGNOSIS — J449 Chronic obstructive pulmonary disease, unspecified: Secondary | ICD-10-CM

## 2019-05-27 NOTE — Telephone Encounter (Signed)
MMM NTBS 30 days given 04/29/19

## 2019-06-05 DIAGNOSIS — J441 Chronic obstructive pulmonary disease with (acute) exacerbation: Secondary | ICD-10-CM | POA: Diagnosis not present

## 2019-06-11 ENCOUNTER — Other Ambulatory Visit: Payer: Self-pay | Admitting: Nurse Practitioner

## 2019-06-18 DIAGNOSIS — J449 Chronic obstructive pulmonary disease, unspecified: Secondary | ICD-10-CM | POA: Diagnosis not present

## 2019-06-18 DIAGNOSIS — J441 Chronic obstructive pulmonary disease with (acute) exacerbation: Secondary | ICD-10-CM | POA: Diagnosis not present

## 2019-06-26 ENCOUNTER — Other Ambulatory Visit: Payer: Self-pay | Admitting: Nurse Practitioner

## 2019-06-26 DIAGNOSIS — E785 Hyperlipidemia, unspecified: Secondary | ICD-10-CM | POA: Diagnosis not present

## 2019-06-26 DIAGNOSIS — E782 Mixed hyperlipidemia: Secondary | ICD-10-CM

## 2019-06-26 DIAGNOSIS — J449 Chronic obstructive pulmonary disease, unspecified: Secondary | ICD-10-CM | POA: Diagnosis not present

## 2019-06-26 DIAGNOSIS — E1369 Other specified diabetes mellitus with other specified complication: Secondary | ICD-10-CM | POA: Diagnosis not present

## 2019-06-26 DIAGNOSIS — I10 Essential (primary) hypertension: Secondary | ICD-10-CM | POA: Diagnosis not present

## 2019-07-03 ENCOUNTER — Other Ambulatory Visit: Payer: Self-pay | Admitting: Nurse Practitioner

## 2019-07-05 DIAGNOSIS — J441 Chronic obstructive pulmonary disease with (acute) exacerbation: Secondary | ICD-10-CM | POA: Diagnosis not present

## 2019-07-10 DIAGNOSIS — J441 Chronic obstructive pulmonary disease with (acute) exacerbation: Secondary | ICD-10-CM | POA: Diagnosis not present

## 2019-07-10 DIAGNOSIS — J449 Chronic obstructive pulmonary disease, unspecified: Secondary | ICD-10-CM | POA: Diagnosis not present

## 2019-07-12 ENCOUNTER — Other Ambulatory Visit: Payer: Self-pay | Admitting: Nurse Practitioner

## 2019-07-12 DIAGNOSIS — E222 Syndrome of inappropriate secretion of antidiuretic hormone: Secondary | ICD-10-CM | POA: Diagnosis not present

## 2019-07-12 DIAGNOSIS — J9611 Chronic respiratory failure with hypoxia: Secondary | ICD-10-CM | POA: Diagnosis not present

## 2019-07-12 DIAGNOSIS — D509 Iron deficiency anemia, unspecified: Secondary | ICD-10-CM | POA: Diagnosis not present

## 2019-07-12 DIAGNOSIS — E119 Type 2 diabetes mellitus without complications: Secondary | ICD-10-CM | POA: Diagnosis not present

## 2019-07-12 DIAGNOSIS — I10 Essential (primary) hypertension: Secondary | ICD-10-CM | POA: Diagnosis not present

## 2019-07-12 DIAGNOSIS — E861 Hypovolemia: Secondary | ICD-10-CM | POA: Diagnosis not present

## 2019-07-12 DIAGNOSIS — E1369 Other specified diabetes mellitus with other specified complication: Secondary | ICD-10-CM | POA: Diagnosis not present

## 2019-07-12 DIAGNOSIS — G9341 Metabolic encephalopathy: Secondary | ICD-10-CM | POA: Diagnosis not present

## 2019-07-12 DIAGNOSIS — R1013 Epigastric pain: Secondary | ICD-10-CM | POA: Diagnosis not present

## 2019-07-12 DIAGNOSIS — J449 Chronic obstructive pulmonary disease, unspecified: Secondary | ICD-10-CM | POA: Diagnosis not present

## 2019-07-12 DIAGNOSIS — E860 Dehydration: Secondary | ICD-10-CM | POA: Diagnosis not present

## 2019-07-12 DIAGNOSIS — I714 Abdominal aortic aneurysm, without rupture: Secondary | ICD-10-CM | POA: Diagnosis not present

## 2019-07-12 DIAGNOSIS — J479 Bronchiectasis, uncomplicated: Secondary | ICD-10-CM | POA: Diagnosis not present

## 2019-07-12 DIAGNOSIS — Z9981 Dependence on supplemental oxygen: Secondary | ICD-10-CM | POA: Diagnosis not present

## 2019-07-12 DIAGNOSIS — Z9221 Personal history of antineoplastic chemotherapy: Secondary | ICD-10-CM | POA: Diagnosis not present

## 2019-07-12 DIAGNOSIS — E785 Hyperlipidemia, unspecified: Secondary | ICD-10-CM | POA: Diagnosis not present

## 2019-07-12 DIAGNOSIS — J441 Chronic obstructive pulmonary disease with (acute) exacerbation: Secondary | ICD-10-CM | POA: Diagnosis not present

## 2019-07-12 DIAGNOSIS — R410 Disorientation, unspecified: Secondary | ICD-10-CM | POA: Diagnosis not present

## 2019-07-12 DIAGNOSIS — R935 Abnormal findings on diagnostic imaging of other abdominal regions, including retroperitoneum: Secondary | ICD-10-CM | POA: Diagnosis not present

## 2019-07-12 DIAGNOSIS — Z853 Personal history of malignant neoplasm of breast: Secondary | ICD-10-CM | POA: Diagnosis not present

## 2019-07-12 DIAGNOSIS — I491 Atrial premature depolarization: Secondary | ICD-10-CM | POA: Diagnosis not present

## 2019-07-12 DIAGNOSIS — M810 Age-related osteoporosis without current pathological fracture: Secondary | ICD-10-CM | POA: Diagnosis not present

## 2019-07-12 DIAGNOSIS — R109 Unspecified abdominal pain: Secondary | ICD-10-CM | POA: Diagnosis not present

## 2019-07-12 DIAGNOSIS — R918 Other nonspecific abnormal finding of lung field: Secondary | ICD-10-CM | POA: Diagnosis not present

## 2019-07-12 DIAGNOSIS — E871 Hypo-osmolality and hyponatremia: Secondary | ICD-10-CM | POA: Diagnosis not present

## 2019-07-12 DIAGNOSIS — Z9012 Acquired absence of left breast and nipple: Secondary | ICD-10-CM | POA: Diagnosis not present

## 2019-07-17 ENCOUNTER — Other Ambulatory Visit: Payer: Self-pay | Admitting: Nurse Practitioner

## 2019-07-17 DIAGNOSIS — M1990 Unspecified osteoarthritis, unspecified site: Secondary | ICD-10-CM | POA: Diagnosis not present

## 2019-07-17 DIAGNOSIS — I714 Abdominal aortic aneurysm, without rupture: Secondary | ICD-10-CM | POA: Diagnosis not present

## 2019-07-17 DIAGNOSIS — E7800 Pure hypercholesterolemia, unspecified: Secondary | ICD-10-CM | POA: Diagnosis not present

## 2019-07-17 DIAGNOSIS — Z853 Personal history of malignant neoplasm of breast: Secondary | ICD-10-CM | POA: Diagnosis not present

## 2019-07-17 DIAGNOSIS — I10 Essential (primary) hypertension: Secondary | ICD-10-CM

## 2019-07-17 DIAGNOSIS — E119 Type 2 diabetes mellitus without complications: Secondary | ICD-10-CM | POA: Diagnosis not present

## 2019-07-17 DIAGNOSIS — E871 Hypo-osmolality and hyponatremia: Secondary | ICD-10-CM | POA: Diagnosis not present

## 2019-07-17 DIAGNOSIS — Z7984 Long term (current) use of oral hypoglycemic drugs: Secondary | ICD-10-CM | POA: Diagnosis not present

## 2019-07-17 DIAGNOSIS — M810 Age-related osteoporosis without current pathological fracture: Secondary | ICD-10-CM | POA: Diagnosis not present

## 2019-07-17 DIAGNOSIS — R1013 Epigastric pain: Secondary | ICD-10-CM | POA: Diagnosis not present

## 2019-07-17 DIAGNOSIS — I700 Atherosclerosis of aorta: Secondary | ICD-10-CM | POA: Diagnosis not present

## 2019-07-17 DIAGNOSIS — J449 Chronic obstructive pulmonary disease, unspecified: Secondary | ICD-10-CM | POA: Diagnosis not present

## 2019-07-18 DIAGNOSIS — E7800 Pure hypercholesterolemia, unspecified: Secondary | ICD-10-CM | POA: Diagnosis not present

## 2019-07-18 DIAGNOSIS — I10 Essential (primary) hypertension: Secondary | ICD-10-CM | POA: Diagnosis not present

## 2019-07-18 DIAGNOSIS — M810 Age-related osteoporosis without current pathological fracture: Secondary | ICD-10-CM | POA: Diagnosis not present

## 2019-07-18 DIAGNOSIS — Z7984 Long term (current) use of oral hypoglycemic drugs: Secondary | ICD-10-CM | POA: Diagnosis not present

## 2019-07-18 DIAGNOSIS — R1013 Epigastric pain: Secondary | ICD-10-CM | POA: Diagnosis not present

## 2019-07-18 DIAGNOSIS — I714 Abdominal aortic aneurysm, without rupture: Secondary | ICD-10-CM | POA: Diagnosis not present

## 2019-07-18 DIAGNOSIS — E871 Hypo-osmolality and hyponatremia: Secondary | ICD-10-CM | POA: Diagnosis not present

## 2019-07-18 DIAGNOSIS — M1990 Unspecified osteoarthritis, unspecified site: Secondary | ICD-10-CM | POA: Diagnosis not present

## 2019-07-18 DIAGNOSIS — E119 Type 2 diabetes mellitus without complications: Secondary | ICD-10-CM | POA: Diagnosis not present

## 2019-07-18 DIAGNOSIS — J449 Chronic obstructive pulmonary disease, unspecified: Secondary | ICD-10-CM | POA: Diagnosis not present

## 2019-07-18 DIAGNOSIS — Z853 Personal history of malignant neoplasm of breast: Secondary | ICD-10-CM | POA: Diagnosis not present

## 2019-07-18 DIAGNOSIS — I700 Atherosclerosis of aorta: Secondary | ICD-10-CM | POA: Diagnosis not present

## 2019-07-22 DIAGNOSIS — E871 Hypo-osmolality and hyponatremia: Secondary | ICD-10-CM | POA: Diagnosis not present

## 2019-07-22 DIAGNOSIS — E1369 Other specified diabetes mellitus with other specified complication: Secondary | ICD-10-CM | POA: Diagnosis not present

## 2019-07-22 DIAGNOSIS — J449 Chronic obstructive pulmonary disease, unspecified: Secondary | ICD-10-CM | POA: Diagnosis not present

## 2019-07-24 DIAGNOSIS — E871 Hypo-osmolality and hyponatremia: Secondary | ICD-10-CM | POA: Diagnosis not present

## 2019-07-24 DIAGNOSIS — I714 Abdominal aortic aneurysm, without rupture: Secondary | ICD-10-CM | POA: Diagnosis not present

## 2019-07-24 DIAGNOSIS — Z853 Personal history of malignant neoplasm of breast: Secondary | ICD-10-CM | POA: Diagnosis not present

## 2019-07-24 DIAGNOSIS — I10 Essential (primary) hypertension: Secondary | ICD-10-CM | POA: Diagnosis not present

## 2019-07-24 DIAGNOSIS — E7800 Pure hypercholesterolemia, unspecified: Secondary | ICD-10-CM | POA: Diagnosis not present

## 2019-07-24 DIAGNOSIS — R1013 Epigastric pain: Secondary | ICD-10-CM | POA: Diagnosis not present

## 2019-07-24 DIAGNOSIS — M810 Age-related osteoporosis without current pathological fracture: Secondary | ICD-10-CM | POA: Diagnosis not present

## 2019-07-24 DIAGNOSIS — J449 Chronic obstructive pulmonary disease, unspecified: Secondary | ICD-10-CM | POA: Diagnosis not present

## 2019-07-24 DIAGNOSIS — E119 Type 2 diabetes mellitus without complications: Secondary | ICD-10-CM | POA: Diagnosis not present

## 2019-07-24 DIAGNOSIS — M1990 Unspecified osteoarthritis, unspecified site: Secondary | ICD-10-CM | POA: Diagnosis not present

## 2019-07-24 DIAGNOSIS — Z7984 Long term (current) use of oral hypoglycemic drugs: Secondary | ICD-10-CM | POA: Diagnosis not present

## 2019-07-24 DIAGNOSIS — I700 Atherosclerosis of aorta: Secondary | ICD-10-CM | POA: Diagnosis not present

## 2019-07-26 ENCOUNTER — Other Ambulatory Visit: Payer: Self-pay | Admitting: Nurse Practitioner

## 2019-07-26 DIAGNOSIS — I10 Essential (primary) hypertension: Secondary | ICD-10-CM | POA: Diagnosis not present

## 2019-07-26 DIAGNOSIS — E119 Type 2 diabetes mellitus without complications: Secondary | ICD-10-CM | POA: Diagnosis not present

## 2019-07-26 DIAGNOSIS — E871 Hypo-osmolality and hyponatremia: Secondary | ICD-10-CM | POA: Diagnosis not present

## 2019-07-26 DIAGNOSIS — M810 Age-related osteoporosis without current pathological fracture: Secondary | ICD-10-CM | POA: Diagnosis not present

## 2019-07-26 DIAGNOSIS — M1990 Unspecified osteoarthritis, unspecified site: Secondary | ICD-10-CM | POA: Diagnosis not present

## 2019-07-26 DIAGNOSIS — I700 Atherosclerosis of aorta: Secondary | ICD-10-CM | POA: Diagnosis not present

## 2019-07-26 DIAGNOSIS — Z853 Personal history of malignant neoplasm of breast: Secondary | ICD-10-CM | POA: Diagnosis not present

## 2019-07-26 DIAGNOSIS — I714 Abdominal aortic aneurysm, without rupture: Secondary | ICD-10-CM | POA: Diagnosis not present

## 2019-07-26 DIAGNOSIS — Z7984 Long term (current) use of oral hypoglycemic drugs: Secondary | ICD-10-CM | POA: Diagnosis not present

## 2019-07-26 DIAGNOSIS — J449 Chronic obstructive pulmonary disease, unspecified: Secondary | ICD-10-CM | POA: Diagnosis not present

## 2019-07-26 DIAGNOSIS — E7800 Pure hypercholesterolemia, unspecified: Secondary | ICD-10-CM | POA: Diagnosis not present

## 2019-07-26 DIAGNOSIS — R1013 Epigastric pain: Secondary | ICD-10-CM | POA: Diagnosis not present

## 2019-07-29 ENCOUNTER — Other Ambulatory Visit: Payer: Self-pay | Admitting: Nurse Practitioner

## 2019-07-29 DIAGNOSIS — I10 Essential (primary) hypertension: Secondary | ICD-10-CM

## 2019-07-31 DIAGNOSIS — R1013 Epigastric pain: Secondary | ICD-10-CM | POA: Diagnosis not present

## 2019-07-31 DIAGNOSIS — I714 Abdominal aortic aneurysm, without rupture: Secondary | ICD-10-CM | POA: Diagnosis not present

## 2019-07-31 DIAGNOSIS — I10 Essential (primary) hypertension: Secondary | ICD-10-CM | POA: Diagnosis not present

## 2019-07-31 DIAGNOSIS — Z853 Personal history of malignant neoplasm of breast: Secondary | ICD-10-CM | POA: Diagnosis not present

## 2019-07-31 DIAGNOSIS — J449 Chronic obstructive pulmonary disease, unspecified: Secondary | ICD-10-CM | POA: Diagnosis not present

## 2019-07-31 DIAGNOSIS — M810 Age-related osteoporosis without current pathological fracture: Secondary | ICD-10-CM | POA: Diagnosis not present

## 2019-07-31 DIAGNOSIS — E871 Hypo-osmolality and hyponatremia: Secondary | ICD-10-CM | POA: Diagnosis not present

## 2019-07-31 DIAGNOSIS — I700 Atherosclerosis of aorta: Secondary | ICD-10-CM | POA: Diagnosis not present

## 2019-07-31 DIAGNOSIS — E119 Type 2 diabetes mellitus without complications: Secondary | ICD-10-CM | POA: Diagnosis not present

## 2019-07-31 DIAGNOSIS — M1990 Unspecified osteoarthritis, unspecified site: Secondary | ICD-10-CM | POA: Diagnosis not present

## 2019-07-31 DIAGNOSIS — E7800 Pure hypercholesterolemia, unspecified: Secondary | ICD-10-CM | POA: Diagnosis not present

## 2019-07-31 DIAGNOSIS — Z7984 Long term (current) use of oral hypoglycemic drugs: Secondary | ICD-10-CM | POA: Diagnosis not present

## 2019-08-02 DIAGNOSIS — E7800 Pure hypercholesterolemia, unspecified: Secondary | ICD-10-CM | POA: Diagnosis not present

## 2019-08-02 DIAGNOSIS — Z853 Personal history of malignant neoplasm of breast: Secondary | ICD-10-CM | POA: Diagnosis not present

## 2019-08-02 DIAGNOSIS — Z7984 Long term (current) use of oral hypoglycemic drugs: Secondary | ICD-10-CM | POA: Diagnosis not present

## 2019-08-02 DIAGNOSIS — M810 Age-related osteoporosis without current pathological fracture: Secondary | ICD-10-CM | POA: Diagnosis not present

## 2019-08-02 DIAGNOSIS — M1990 Unspecified osteoarthritis, unspecified site: Secondary | ICD-10-CM | POA: Diagnosis not present

## 2019-08-02 DIAGNOSIS — J449 Chronic obstructive pulmonary disease, unspecified: Secondary | ICD-10-CM | POA: Diagnosis not present

## 2019-08-02 DIAGNOSIS — I714 Abdominal aortic aneurysm, without rupture: Secondary | ICD-10-CM | POA: Diagnosis not present

## 2019-08-02 DIAGNOSIS — R1013 Epigastric pain: Secondary | ICD-10-CM | POA: Diagnosis not present

## 2019-08-02 DIAGNOSIS — I700 Atherosclerosis of aorta: Secondary | ICD-10-CM | POA: Diagnosis not present

## 2019-08-02 DIAGNOSIS — I10 Essential (primary) hypertension: Secondary | ICD-10-CM | POA: Diagnosis not present

## 2019-08-02 DIAGNOSIS — E871 Hypo-osmolality and hyponatremia: Secondary | ICD-10-CM | POA: Diagnosis not present

## 2019-08-02 DIAGNOSIS — E119 Type 2 diabetes mellitus without complications: Secondary | ICD-10-CM | POA: Diagnosis not present

## 2019-08-04 ENCOUNTER — Other Ambulatory Visit: Payer: Self-pay | Admitting: Nurse Practitioner

## 2019-08-04 DIAGNOSIS — I10 Essential (primary) hypertension: Secondary | ICD-10-CM

## 2019-08-05 DIAGNOSIS — J441 Chronic obstructive pulmonary disease with (acute) exacerbation: Secondary | ICD-10-CM | POA: Diagnosis not present

## 2019-08-06 ENCOUNTER — Ambulatory Visit (INDEPENDENT_AMBULATORY_CARE_PROVIDER_SITE_OTHER): Payer: Medicare Other

## 2019-08-06 ENCOUNTER — Other Ambulatory Visit: Payer: Self-pay

## 2019-08-06 DIAGNOSIS — J449 Chronic obstructive pulmonary disease, unspecified: Secondary | ICD-10-CM

## 2019-08-06 DIAGNOSIS — F411 Generalized anxiety disorder: Secondary | ICD-10-CM

## 2019-08-06 DIAGNOSIS — I700 Atherosclerosis of aorta: Secondary | ICD-10-CM | POA: Diagnosis not present

## 2019-08-06 DIAGNOSIS — E119 Type 2 diabetes mellitus without complications: Secondary | ICD-10-CM

## 2019-08-06 DIAGNOSIS — I714 Abdominal aortic aneurysm, without rupture: Secondary | ICD-10-CM | POA: Diagnosis not present

## 2019-08-06 DIAGNOSIS — E7800 Pure hypercholesterolemia, unspecified: Secondary | ICD-10-CM

## 2019-08-06 DIAGNOSIS — M810 Age-related osteoporosis without current pathological fracture: Secondary | ICD-10-CM

## 2019-08-06 DIAGNOSIS — F17210 Nicotine dependence, cigarettes, uncomplicated: Secondary | ICD-10-CM

## 2019-08-06 DIAGNOSIS — Z7984 Long term (current) use of oral hypoglycemic drugs: Secondary | ICD-10-CM

## 2019-08-06 DIAGNOSIS — R1013 Epigastric pain: Secondary | ICD-10-CM | POA: Diagnosis not present

## 2019-08-06 DIAGNOSIS — E871 Hypo-osmolality and hyponatremia: Secondary | ICD-10-CM | POA: Diagnosis not present

## 2019-08-06 DIAGNOSIS — I10 Essential (primary) hypertension: Secondary | ICD-10-CM | POA: Diagnosis not present

## 2019-08-06 DIAGNOSIS — M1990 Unspecified osteoarthritis, unspecified site: Secondary | ICD-10-CM

## 2019-08-06 DIAGNOSIS — F329 Major depressive disorder, single episode, unspecified: Secondary | ICD-10-CM

## 2019-08-06 DIAGNOSIS — E1369 Other specified diabetes mellitus with other specified complication: Secondary | ICD-10-CM | POA: Diagnosis not present

## 2019-08-06 DIAGNOSIS — E785 Hyperlipidemia, unspecified: Secondary | ICD-10-CM | POA: Diagnosis not present

## 2019-08-07 DIAGNOSIS — I10 Essential (primary) hypertension: Secondary | ICD-10-CM | POA: Diagnosis not present

## 2019-08-07 DIAGNOSIS — R1013 Epigastric pain: Secondary | ICD-10-CM | POA: Diagnosis not present

## 2019-08-07 DIAGNOSIS — E7800 Pure hypercholesterolemia, unspecified: Secondary | ICD-10-CM | POA: Diagnosis not present

## 2019-08-07 DIAGNOSIS — E119 Type 2 diabetes mellitus without complications: Secondary | ICD-10-CM | POA: Diagnosis not present

## 2019-08-07 DIAGNOSIS — I700 Atherosclerosis of aorta: Secondary | ICD-10-CM | POA: Diagnosis not present

## 2019-08-07 DIAGNOSIS — Z7984 Long term (current) use of oral hypoglycemic drugs: Secondary | ICD-10-CM | POA: Diagnosis not present

## 2019-08-07 DIAGNOSIS — Z853 Personal history of malignant neoplasm of breast: Secondary | ICD-10-CM | POA: Diagnosis not present

## 2019-08-07 DIAGNOSIS — M1990 Unspecified osteoarthritis, unspecified site: Secondary | ICD-10-CM | POA: Diagnosis not present

## 2019-08-07 DIAGNOSIS — E871 Hypo-osmolality and hyponatremia: Secondary | ICD-10-CM | POA: Diagnosis not present

## 2019-08-07 DIAGNOSIS — M810 Age-related osteoporosis without current pathological fracture: Secondary | ICD-10-CM | POA: Diagnosis not present

## 2019-08-07 DIAGNOSIS — I714 Abdominal aortic aneurysm, without rupture: Secondary | ICD-10-CM | POA: Diagnosis not present

## 2019-08-07 DIAGNOSIS — J449 Chronic obstructive pulmonary disease, unspecified: Secondary | ICD-10-CM | POA: Diagnosis not present

## 2019-08-09 DIAGNOSIS — I10 Essential (primary) hypertension: Secondary | ICD-10-CM | POA: Diagnosis not present

## 2019-08-09 DIAGNOSIS — J449 Chronic obstructive pulmonary disease, unspecified: Secondary | ICD-10-CM | POA: Diagnosis not present

## 2019-08-09 DIAGNOSIS — R1013 Epigastric pain: Secondary | ICD-10-CM | POA: Diagnosis not present

## 2019-08-09 DIAGNOSIS — E119 Type 2 diabetes mellitus without complications: Secondary | ICD-10-CM | POA: Diagnosis not present

## 2019-08-09 DIAGNOSIS — E7800 Pure hypercholesterolemia, unspecified: Secondary | ICD-10-CM | POA: Diagnosis not present

## 2019-08-09 DIAGNOSIS — Z853 Personal history of malignant neoplasm of breast: Secondary | ICD-10-CM | POA: Diagnosis not present

## 2019-08-09 DIAGNOSIS — M810 Age-related osteoporosis without current pathological fracture: Secondary | ICD-10-CM | POA: Diagnosis not present

## 2019-08-09 DIAGNOSIS — I714 Abdominal aortic aneurysm, without rupture: Secondary | ICD-10-CM | POA: Diagnosis not present

## 2019-08-09 DIAGNOSIS — M1990 Unspecified osteoarthritis, unspecified site: Secondary | ICD-10-CM | POA: Diagnosis not present

## 2019-08-09 DIAGNOSIS — Z7984 Long term (current) use of oral hypoglycemic drugs: Secondary | ICD-10-CM | POA: Diagnosis not present

## 2019-08-09 DIAGNOSIS — I700 Atherosclerosis of aorta: Secondary | ICD-10-CM | POA: Diagnosis not present

## 2019-08-09 DIAGNOSIS — E871 Hypo-osmolality and hyponatremia: Secondary | ICD-10-CM | POA: Diagnosis not present

## 2019-08-14 DIAGNOSIS — E119 Type 2 diabetes mellitus without complications: Secondary | ICD-10-CM | POA: Diagnosis not present

## 2019-08-14 DIAGNOSIS — I10 Essential (primary) hypertension: Secondary | ICD-10-CM | POA: Diagnosis not present

## 2019-08-14 DIAGNOSIS — M1990 Unspecified osteoarthritis, unspecified site: Secondary | ICD-10-CM | POA: Diagnosis not present

## 2019-08-14 DIAGNOSIS — I700 Atherosclerosis of aorta: Secondary | ICD-10-CM | POA: Diagnosis not present

## 2019-08-14 DIAGNOSIS — E7800 Pure hypercholesterolemia, unspecified: Secondary | ICD-10-CM | POA: Diagnosis not present

## 2019-08-14 DIAGNOSIS — I714 Abdominal aortic aneurysm, without rupture: Secondary | ICD-10-CM | POA: Diagnosis not present

## 2019-08-14 DIAGNOSIS — Z853 Personal history of malignant neoplasm of breast: Secondary | ICD-10-CM | POA: Diagnosis not present

## 2019-08-14 DIAGNOSIS — R1013 Epigastric pain: Secondary | ICD-10-CM | POA: Diagnosis not present

## 2019-08-14 DIAGNOSIS — M810 Age-related osteoporosis without current pathological fracture: Secondary | ICD-10-CM | POA: Diagnosis not present

## 2019-08-14 DIAGNOSIS — J449 Chronic obstructive pulmonary disease, unspecified: Secondary | ICD-10-CM | POA: Diagnosis not present

## 2019-08-14 DIAGNOSIS — Z7984 Long term (current) use of oral hypoglycemic drugs: Secondary | ICD-10-CM | POA: Diagnosis not present

## 2019-08-14 DIAGNOSIS — E871 Hypo-osmolality and hyponatremia: Secondary | ICD-10-CM | POA: Diagnosis not present

## 2019-08-21 ENCOUNTER — Other Ambulatory Visit: Payer: Self-pay | Admitting: Nurse Practitioner

## 2019-08-21 DIAGNOSIS — I10 Essential (primary) hypertension: Secondary | ICD-10-CM

## 2019-09-01 ENCOUNTER — Other Ambulatory Visit: Payer: Self-pay | Admitting: Nurse Practitioner

## 2019-09-01 DIAGNOSIS — I10 Essential (primary) hypertension: Secondary | ICD-10-CM

## 2019-10-31 ENCOUNTER — Other Ambulatory Visit: Payer: Self-pay | Admitting: Nurse Practitioner

## 2019-10-31 DIAGNOSIS — I10 Essential (primary) hypertension: Secondary | ICD-10-CM

## 2019-12-02 ENCOUNTER — Other Ambulatory Visit: Payer: Self-pay | Admitting: Nurse Practitioner

## 2019-12-02 DIAGNOSIS — I10 Essential (primary) hypertension: Secondary | ICD-10-CM

## 2019-12-02 DIAGNOSIS — J449 Chronic obstructive pulmonary disease, unspecified: Secondary | ICD-10-CM

## 2019-12-02 DIAGNOSIS — E089 Diabetes mellitus due to underlying condition without complications: Secondary | ICD-10-CM

## 2020-01-21 ENCOUNTER — Other Ambulatory Visit: Payer: Self-pay | Admitting: Nurse Practitioner

## 2020-01-21 DIAGNOSIS — I10 Essential (primary) hypertension: Secondary | ICD-10-CM

## 2020-01-21 NOTE — Telephone Encounter (Signed)
MMM. NTBS 30 days given 12/02/19

## 2020-01-22 MED ORDER — METOPROLOL TARTRATE 25 MG PO TABS: 25.0000 mg | ORAL_TABLET | Freq: Two times a day (BID) | ORAL | 0 refills | Status: DC

## 2020-01-22 NOTE — Addendum Note (Signed)
Addended by: Zannie Cove on: 01/22/2020 01:52 PM   Modules accepted: Orders

## 2020-01-22 NOTE — Telephone Encounter (Signed)
[place] for 1 month supply

## 2020-01-22 NOTE — Telephone Encounter (Signed)
Spoke with patient's daughter - states that patient moved next to her in [place] and she will be getting a new provider.  Wanting to know if MMM will giver her a month supply until she finds a new provider.  Please advise

## 2020-01-22 NOTE — Telephone Encounter (Signed)
Sent 30 days in per [facility] request

## 2020-01-28 ENCOUNTER — Other Ambulatory Visit: Payer: Self-pay | Admitting: Nurse Practitioner

## 2020-01-28 DIAGNOSIS — J449 Chronic obstructive pulmonary disease, unspecified: Secondary | ICD-10-CM

## 2020-01-28 DIAGNOSIS — I10 Essential (primary) hypertension: Secondary | ICD-10-CM

## 2020-07-02 ENCOUNTER — Other Ambulatory Visit: Payer: Self-pay | Admitting: Nurse Practitioner

## 2020-11-25 DEATH — deceased
